# Patient Record
Sex: Male | Born: 1945
Health system: Southern US, Community
[De-identification: ages and names within clinical notes are randomized; demographics above are authoritative.]

## PROBLEM LIST (undated history)

## (undated) DIAGNOSIS — G4700 Insomnia, unspecified: Secondary | ICD-10-CM

## (undated) DIAGNOSIS — I2510 Atherosclerotic heart disease of native coronary artery without angina pectoris: Secondary | ICD-10-CM

## (undated) DIAGNOSIS — S83282A Other tear of lateral meniscus, current injury, left knee, initial encounter: Secondary | ICD-10-CM

## (undated) DIAGNOSIS — M545 Low back pain, unspecified: Secondary | ICD-10-CM

## (undated) DIAGNOSIS — I7122 Aneurysm of the aortic arch, without rupture: Secondary | ICD-10-CM

## (undated) DIAGNOSIS — I519 Heart disease, unspecified: Secondary | ICD-10-CM

## (undated) DIAGNOSIS — E785 Hyperlipidemia, unspecified: Secondary | ICD-10-CM

## (undated) DIAGNOSIS — N529 Male erectile dysfunction, unspecified: Secondary | ICD-10-CM

## (undated) DIAGNOSIS — R7301 Impaired fasting glucose: Secondary | ICD-10-CM

## (undated) DIAGNOSIS — R030 Elevated blood-pressure reading, without diagnosis of hypertension: Secondary | ICD-10-CM

## (undated) HISTORY — DX: Low back pain, unspecified: M54.50

## (undated) HISTORY — PX: CATARACT EXTRACTION: SUR2

## (undated) HISTORY — DX: Hyperlipidemia, unspecified: E78.5

## (undated) HISTORY — DX: Male erectile dysfunction, unspecified: N52.9

## (undated) HISTORY — PX: COLONOSCOPY: SHX174

## (undated) HISTORY — PX: CARDIAC CATHETERIZATION: SHX172

## (undated) HISTORY — DX: Low back pain: M54.5

## (undated) HISTORY — DX: Impaired fasting glucose: R73.01

## (undated) HISTORY — DX: Aneurysm of the aortic arch, without rupture: I71.22

## (undated) HISTORY — DX: Heart disease, unspecified: I51.9

## (undated) HISTORY — DX: Atherosclerotic heart disease of native coronary artery without angina pectoris: I25.10

## (undated) HISTORY — DX: Insomnia, unspecified: G47.00

## (undated) HISTORY — DX: Other tear of lateral meniscus, current injury, left knee, initial encounter: S83.282A

## (undated) HISTORY — DX: Elevated blood-pressure reading, without diagnosis of hypertension: R03.0

---

## 2012-05-24 DIAGNOSIS — Z1331 Encounter for screening for depression: Secondary | ICD-10-CM | POA: Diagnosis not present

## 2012-05-24 DIAGNOSIS — M545 Low back pain, unspecified: Secondary | ICD-10-CM | POA: Diagnosis not present

## 2012-05-24 DIAGNOSIS — E7800 Pure hypercholesterolemia, unspecified: Secondary | ICD-10-CM | POA: Diagnosis not present

## 2012-05-24 DIAGNOSIS — Z86010 Personal history of colonic polyps: Secondary | ICD-10-CM | POA: Diagnosis not present

## 2012-05-24 DIAGNOSIS — Z79899 Other long term (current) drug therapy: Secondary | ICD-10-CM | POA: Diagnosis not present

## 2012-05-24 DIAGNOSIS — G4700 Insomnia, unspecified: Secondary | ICD-10-CM | POA: Diagnosis not present

## 2012-05-24 DIAGNOSIS — N529 Male erectile dysfunction, unspecified: Secondary | ICD-10-CM | POA: Diagnosis not present

## 2012-05-24 DIAGNOSIS — Z0000 Encounter for general adult medical examination without abnormal findings: Secondary | ICD-10-CM | POA: Diagnosis not present

## 2012-10-03 DIAGNOSIS — Z961 Presence of intraocular lens: Secondary | ICD-10-CM | POA: Diagnosis not present

## 2012-10-17 DIAGNOSIS — Z09 Encounter for follow-up examination after completed treatment for conditions other than malignant neoplasm: Secondary | ICD-10-CM | POA: Diagnosis not present

## 2012-10-17 DIAGNOSIS — Z86010 Personal history of colonic polyps: Secondary | ICD-10-CM | POA: Diagnosis not present

## 2013-05-06 DIAGNOSIS — N390 Urinary tract infection, site not specified: Secondary | ICD-10-CM | POA: Diagnosis not present

## 2013-06-12 DIAGNOSIS — N529 Male erectile dysfunction, unspecified: Secondary | ICD-10-CM | POA: Diagnosis not present

## 2013-06-12 DIAGNOSIS — E7800 Pure hypercholesterolemia, unspecified: Secondary | ICD-10-CM | POA: Diagnosis not present

## 2013-06-12 DIAGNOSIS — Z86010 Personal history of colonic polyps: Secondary | ICD-10-CM | POA: Diagnosis not present

## 2013-06-12 DIAGNOSIS — Z79899 Other long term (current) drug therapy: Secondary | ICD-10-CM | POA: Diagnosis not present

## 2013-06-12 DIAGNOSIS — N390 Urinary tract infection, site not specified: Secondary | ICD-10-CM | POA: Diagnosis not present

## 2013-06-12 DIAGNOSIS — Z125 Encounter for screening for malignant neoplasm of prostate: Secondary | ICD-10-CM | POA: Diagnosis not present

## 2013-06-12 DIAGNOSIS — Z1331 Encounter for screening for depression: Secondary | ICD-10-CM | POA: Diagnosis not present

## 2013-06-12 DIAGNOSIS — M545 Low back pain, unspecified: Secondary | ICD-10-CM | POA: Diagnosis not present

## 2013-06-12 DIAGNOSIS — Z0000 Encounter for general adult medical examination without abnormal findings: Secondary | ICD-10-CM | POA: Diagnosis not present

## 2013-07-24 DIAGNOSIS — Z961 Presence of intraocular lens: Secondary | ICD-10-CM | POA: Diagnosis not present

## 2013-07-24 DIAGNOSIS — H524 Presbyopia: Secondary | ICD-10-CM | POA: Diagnosis not present

## 2013-07-24 DIAGNOSIS — H52229 Regular astigmatism, unspecified eye: Secondary | ICD-10-CM | POA: Diagnosis not present

## 2013-09-12 DIAGNOSIS — F528 Other sexual dysfunction not due to a substance or known physiological condition: Secondary | ICD-10-CM | POA: Diagnosis not present

## 2013-09-12 DIAGNOSIS — Z1331 Encounter for screening for depression: Secondary | ICD-10-CM | POA: Diagnosis not present

## 2013-09-12 DIAGNOSIS — Z6825 Body mass index (BMI) 25.0-25.9, adult: Secondary | ICD-10-CM | POA: Diagnosis not present

## 2013-09-12 DIAGNOSIS — IMO0002 Reserved for concepts with insufficient information to code with codable children: Secondary | ICD-10-CM | POA: Diagnosis not present

## 2013-09-12 DIAGNOSIS — E785 Hyperlipidemia, unspecified: Secondary | ICD-10-CM | POA: Diagnosis not present

## 2013-09-12 DIAGNOSIS — Z23 Encounter for immunization: Secondary | ICD-10-CM | POA: Diagnosis not present

## 2014-05-14 DIAGNOSIS — E785 Hyperlipidemia, unspecified: Secondary | ICD-10-CM | POA: Diagnosis not present

## 2014-05-14 DIAGNOSIS — Z125 Encounter for screening for malignant neoplasm of prostate: Secondary | ICD-10-CM | POA: Diagnosis not present

## 2014-05-14 DIAGNOSIS — Z79899 Other long term (current) drug therapy: Secondary | ICD-10-CM | POA: Diagnosis not present

## 2014-05-21 DIAGNOSIS — Z23 Encounter for immunization: Secondary | ICD-10-CM | POA: Diagnosis not present

## 2014-05-21 DIAGNOSIS — Z1212 Encounter for screening for malignant neoplasm of rectum: Secondary | ICD-10-CM | POA: Diagnosis not present

## 2014-05-21 DIAGNOSIS — E785 Hyperlipidemia, unspecified: Secondary | ICD-10-CM | POA: Diagnosis not present

## 2014-05-21 DIAGNOSIS — F528 Other sexual dysfunction not due to a substance or known physiological condition: Secondary | ICD-10-CM | POA: Diagnosis not present

## 2014-05-21 DIAGNOSIS — R030 Elevated blood-pressure reading, without diagnosis of hypertension: Secondary | ICD-10-CM | POA: Diagnosis not present

## 2014-05-21 DIAGNOSIS — Z1331 Encounter for screening for depression: Secondary | ICD-10-CM | POA: Diagnosis not present

## 2014-05-21 DIAGNOSIS — Z0000 Encounter for general adult medical examination without abnormal findings: Secondary | ICD-10-CM | POA: Diagnosis not present

## 2014-05-21 DIAGNOSIS — M79609 Pain in unspecified limb: Secondary | ICD-10-CM | POA: Diagnosis not present

## 2014-05-26 ENCOUNTER — Other Ambulatory Visit: Payer: Self-pay | Admitting: Internal Medicine

## 2014-05-26 DIAGNOSIS — Z0000 Encounter for general adult medical examination without abnormal findings: Secondary | ICD-10-CM

## 2014-05-30 ENCOUNTER — Ambulatory Visit: Payer: Self-pay

## 2014-06-16 ENCOUNTER — Ambulatory Visit
Admission: RE | Admit: 2014-06-16 | Discharge: 2014-06-16 | Disposition: A | Discharge status: Home / Self Care | Payer: Medicare Other | Source: Ambulatory Visit | Attending: Internal Medicine | Admitting: Internal Medicine

## 2014-06-16 DIAGNOSIS — Z136 Encounter for screening for cardiovascular disorders: Secondary | ICD-10-CM | POA: Diagnosis not present

## 2014-06-16 DIAGNOSIS — Z0000 Encounter for general adult medical examination without abnormal findings: Secondary | ICD-10-CM

## 2014-09-01 DIAGNOSIS — H524 Presbyopia: Secondary | ICD-10-CM | POA: Diagnosis not present

## 2014-09-01 DIAGNOSIS — Z961 Presence of intraocular lens: Secondary | ICD-10-CM | POA: Diagnosis not present

## 2014-09-02 DIAGNOSIS — Z23 Encounter for immunization: Secondary | ICD-10-CM | POA: Diagnosis not present

## 2015-06-03 DIAGNOSIS — R030 Elevated blood-pressure reading, without diagnosis of hypertension: Secondary | ICD-10-CM | POA: Diagnosis not present

## 2015-06-03 DIAGNOSIS — Z125 Encounter for screening for malignant neoplasm of prostate: Secondary | ICD-10-CM | POA: Diagnosis not present

## 2015-06-03 DIAGNOSIS — E785 Hyperlipidemia, unspecified: Secondary | ICD-10-CM | POA: Diagnosis not present

## 2015-06-03 DIAGNOSIS — E784 Other hyperlipidemia: Secondary | ICD-10-CM | POA: Diagnosis not present

## 2015-06-03 DIAGNOSIS — Z0000 Encounter for general adult medical examination without abnormal findings: Secondary | ICD-10-CM | POA: Diagnosis not present

## 2015-06-03 DIAGNOSIS — E781 Pure hyperglyceridemia: Secondary | ICD-10-CM | POA: Diagnosis not present

## 2015-06-08 DIAGNOSIS — Z1212 Encounter for screening for malignant neoplasm of rectum: Secondary | ICD-10-CM | POA: Diagnosis not present

## 2015-06-11 DIAGNOSIS — R030 Elevated blood-pressure reading, without diagnosis of hypertension: Secondary | ICD-10-CM | POA: Diagnosis not present

## 2015-06-11 DIAGNOSIS — F5221 Male erectile disorder: Secondary | ICD-10-CM | POA: Diagnosis not present

## 2015-06-11 DIAGNOSIS — M545 Low back pain: Secondary | ICD-10-CM | POA: Diagnosis not present

## 2015-06-11 DIAGNOSIS — Z0000 Encounter for general adult medical examination without abnormal findings: Secondary | ICD-10-CM | POA: Diagnosis not present

## 2015-06-11 DIAGNOSIS — E784 Other hyperlipidemia: Secondary | ICD-10-CM | POA: Diagnosis not present

## 2015-06-11 DIAGNOSIS — Z1389 Encounter for screening for other disorder: Secondary | ICD-10-CM | POA: Diagnosis not present

## 2015-06-11 DIAGNOSIS — Z6824 Body mass index (BMI) 24.0-24.9, adult: Secondary | ICD-10-CM | POA: Diagnosis not present

## 2015-07-25 DIAGNOSIS — Z23 Encounter for immunization: Secondary | ICD-10-CM | POA: Diagnosis not present

## 2016-02-24 DIAGNOSIS — G930 Cerebral cysts: Secondary | ICD-10-CM | POA: Diagnosis not present

## 2016-02-24 DIAGNOSIS — Z6824 Body mass index (BMI) 24.0-24.9, adult: Secondary | ICD-10-CM | POA: Diagnosis not present

## 2016-02-24 DIAGNOSIS — E784 Other hyperlipidemia: Secondary | ICD-10-CM | POA: Diagnosis not present

## 2016-02-24 DIAGNOSIS — R030 Elevated blood-pressure reading, without diagnosis of hypertension: Secondary | ICD-10-CM | POA: Diagnosis not present

## 2016-02-24 DIAGNOSIS — R209 Unspecified disturbances of skin sensation: Secondary | ICD-10-CM | POA: Diagnosis not present

## 2016-06-13 DIAGNOSIS — E784 Other hyperlipidemia: Secondary | ICD-10-CM | POA: Diagnosis not present

## 2016-06-13 DIAGNOSIS — Z125 Encounter for screening for malignant neoplasm of prostate: Secondary | ICD-10-CM | POA: Diagnosis not present

## 2016-06-13 DIAGNOSIS — F5221 Male erectile disorder: Secondary | ICD-10-CM | POA: Diagnosis not present

## 2016-06-17 DIAGNOSIS — R030 Elevated blood-pressure reading, without diagnosis of hypertension: Secondary | ICD-10-CM | POA: Diagnosis not present

## 2016-06-17 DIAGNOSIS — Z0000 Encounter for general adult medical examination without abnormal findings: Secondary | ICD-10-CM | POA: Diagnosis not present

## 2016-06-17 DIAGNOSIS — Z6824 Body mass index (BMI) 24.0-24.9, adult: Secondary | ICD-10-CM | POA: Diagnosis not present

## 2016-06-17 DIAGNOSIS — Z23 Encounter for immunization: Secondary | ICD-10-CM | POA: Diagnosis not present

## 2016-06-17 DIAGNOSIS — Z1389 Encounter for screening for other disorder: Secondary | ICD-10-CM | POA: Diagnosis not present

## 2016-06-17 DIAGNOSIS — E784 Other hyperlipidemia: Secondary | ICD-10-CM | POA: Diagnosis not present

## 2016-06-17 DIAGNOSIS — F5221 Male erectile disorder: Secondary | ICD-10-CM | POA: Diagnosis not present

## 2016-06-17 DIAGNOSIS — R209 Unspecified disturbances of skin sensation: Secondary | ICD-10-CM | POA: Diagnosis not present

## 2016-06-17 DIAGNOSIS — Z1212 Encounter for screening for malignant neoplasm of rectum: Secondary | ICD-10-CM | POA: Diagnosis not present

## 2016-06-17 DIAGNOSIS — M545 Low back pain: Secondary | ICD-10-CM | POA: Diagnosis not present

## 2016-06-20 DIAGNOSIS — Z1212 Encounter for screening for malignant neoplasm of rectum: Secondary | ICD-10-CM | POA: Diagnosis not present

## 2016-08-05 IMAGING — US US AORTA SCREENING (MEDICARE)
1 series · 14 of 19 positions shown · non-contrast
Comparison: None.

CLINICAL DATA: Medicare screening exam for abdominal aortic
aneurysm.

EXAM:
ABDOMINAL AORTA SCREENING ULTRASOUND
TECHNIQUE: Ultrasound examination of the abdominal aorta was performed as a
screening evaluation for abdominal aortic aneurysm.

[Series 1: us aorta screening (medicare) · 0.18mm/px · 14 of 19 slices shown]
[im 1/19]
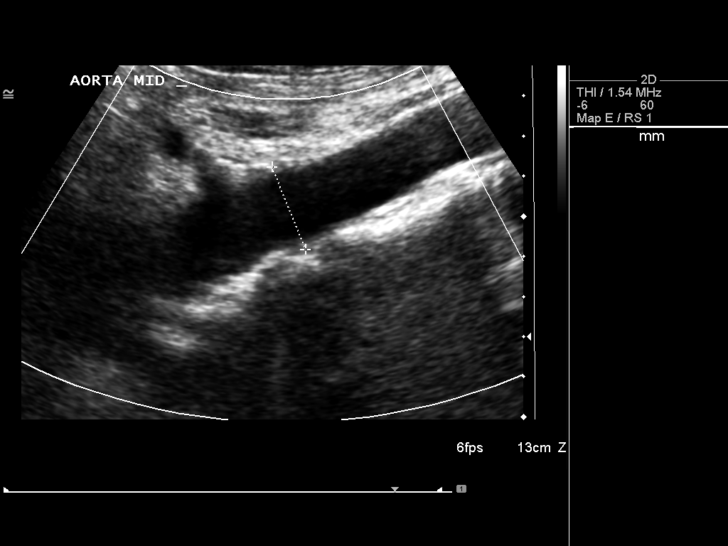
[im 3/19]
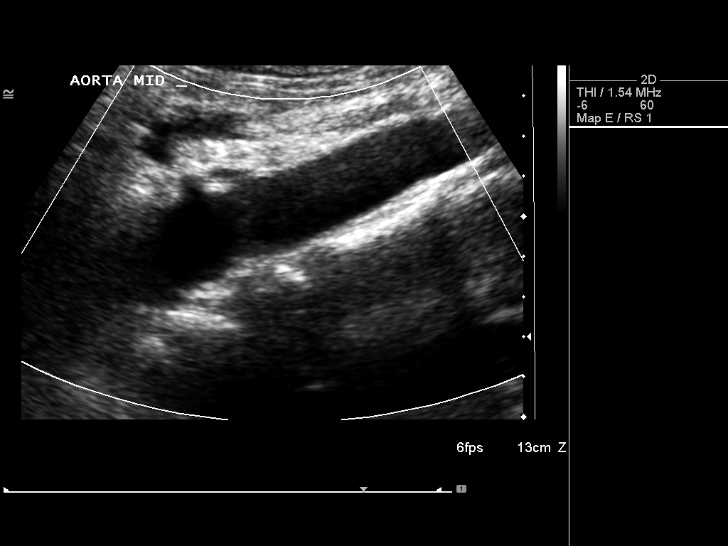
[im 4/19]
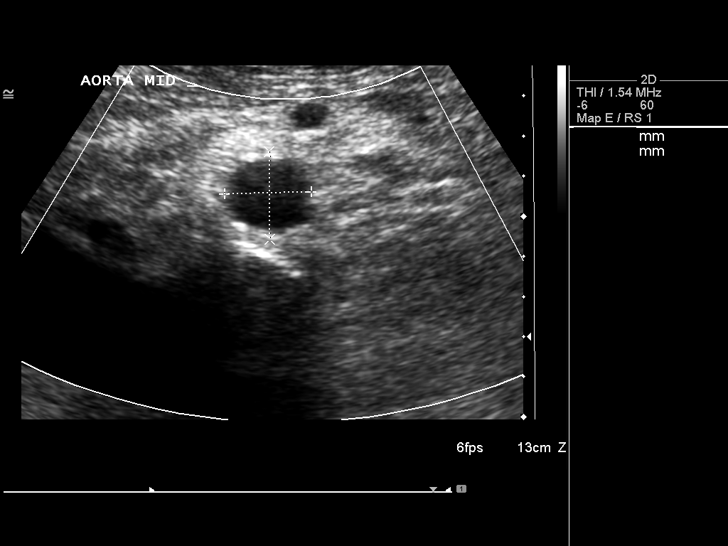
[im 5/19]
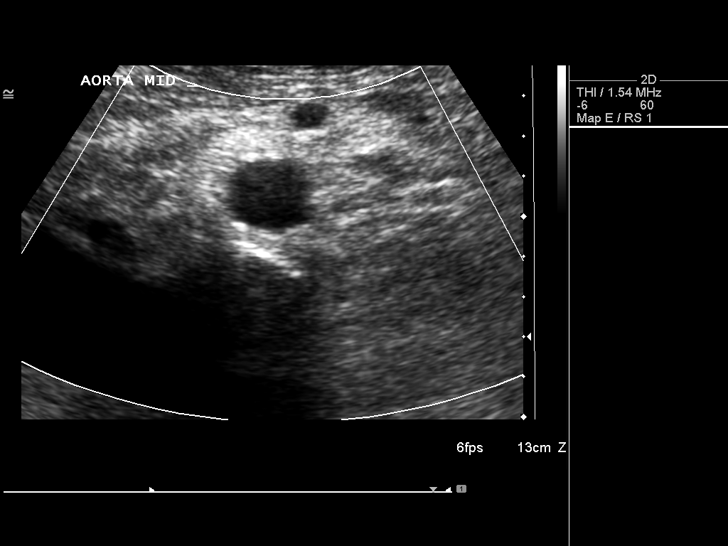
[im 7/19]
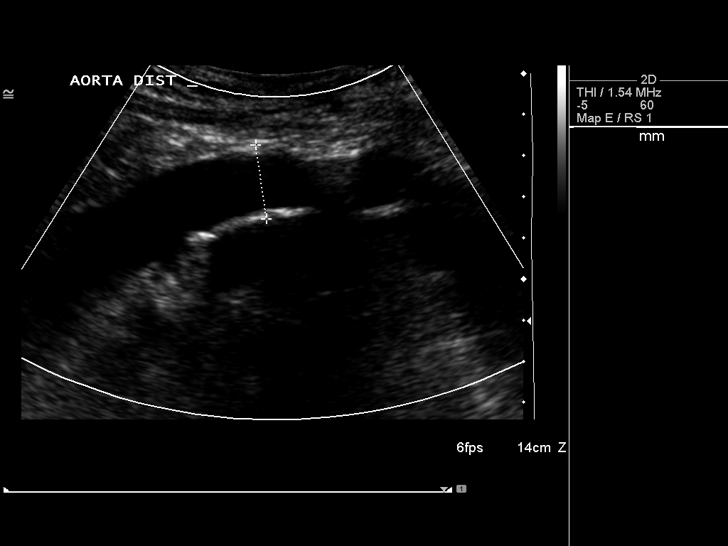
[im 8/19]
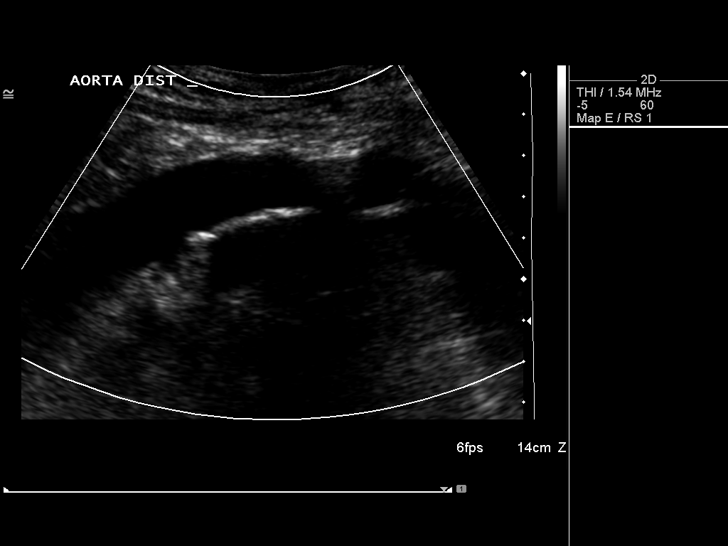
[im 9/19]
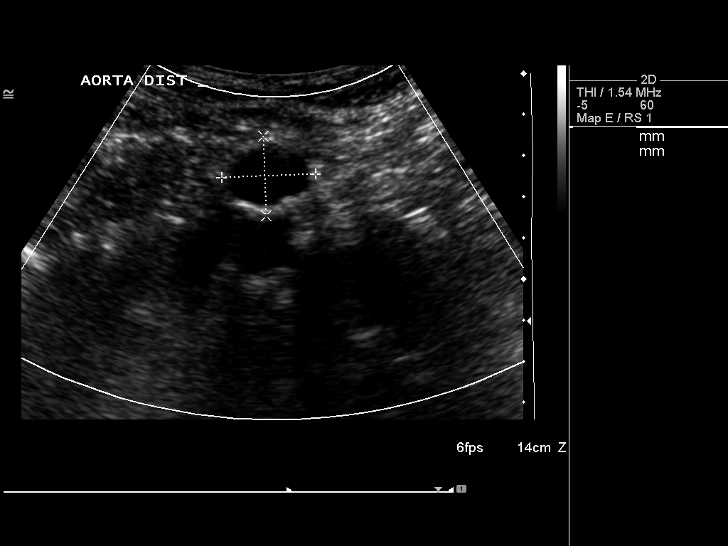
[im 11/19]
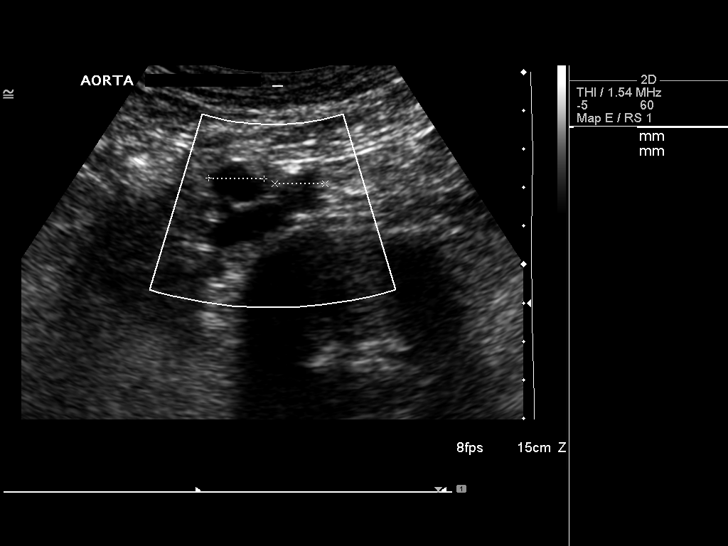
[im 12/19]
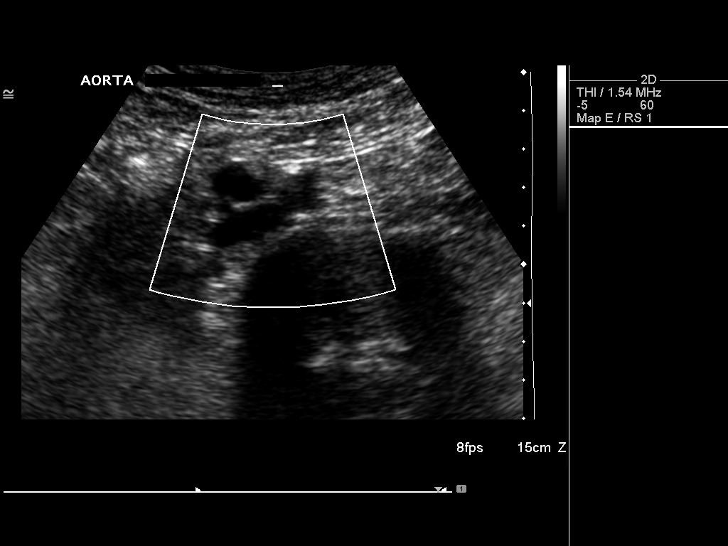
[im 13/19]
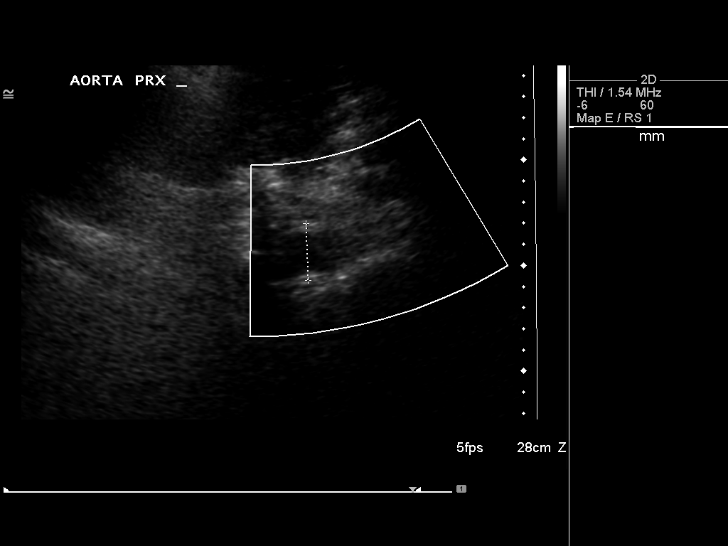
[im 15/19]
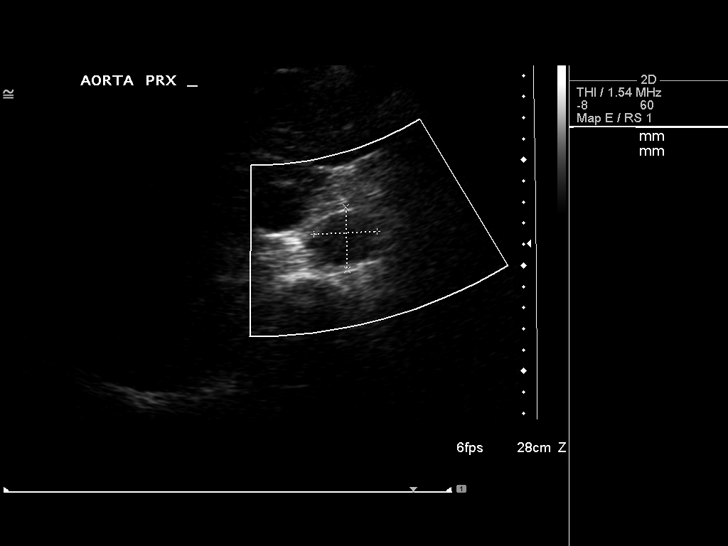
[im 16/19]
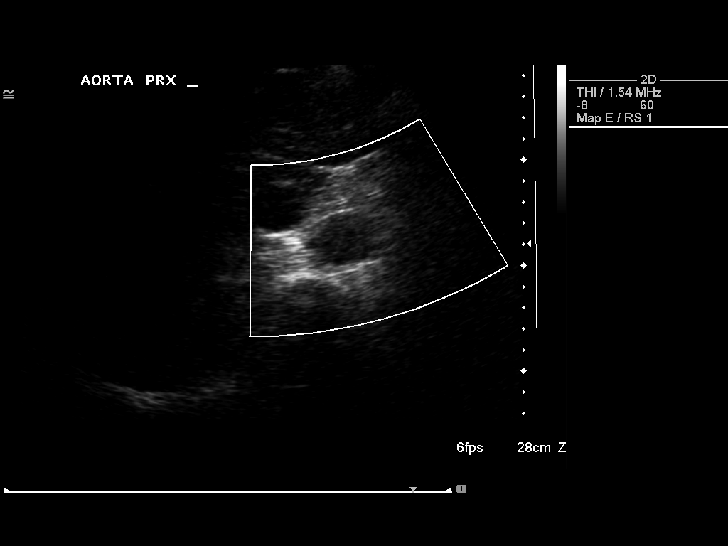
[im 17/19]
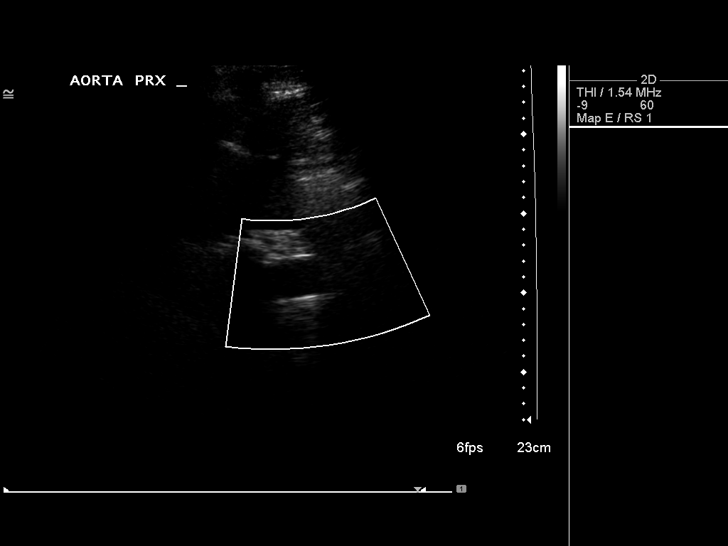
[im 19/19]
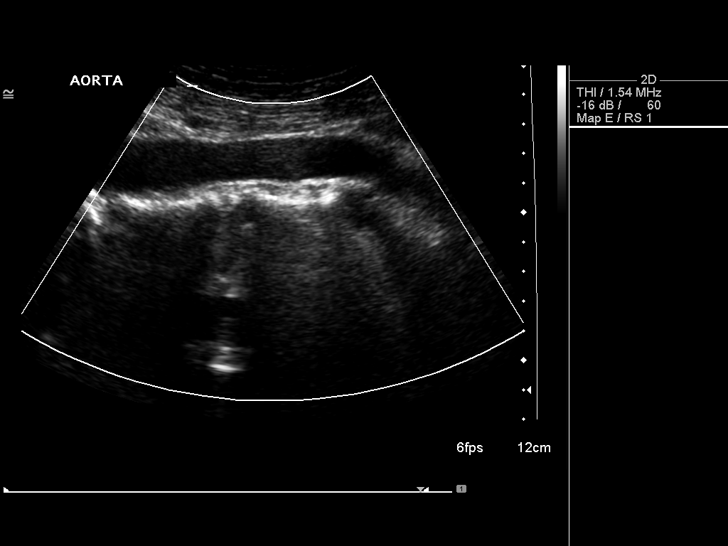

[14 of 19 positions shown; findings below may reference images not displayed]

FINDINGS: Abdominal Aorta

The abdominal aorta exhibits very mild failure to taper in the
infrarenal portion. There small amount of soft plaque present.

Maximum AP

Diameter:  The maximal AP dimension is 3 cm.

Maximum TRV

Diameter: The maximal transverse dimension is 3 cm.

The mid abdominal aorta measures 2.2 cm in diameter. The infrarenal
aorta measures 1.9 cm AP x 2.3 cm stroke transversely.
IMPRESSION: There is mild failure to taper of the infrarenal abdominal aorta,
but there is no evidence of an aneurysm.

## 2017-05-17 DIAGNOSIS — H52203 Unspecified astigmatism, bilateral: Secondary | ICD-10-CM | POA: Diagnosis not present

## 2017-05-17 DIAGNOSIS — H524 Presbyopia: Secondary | ICD-10-CM | POA: Diagnosis not present

## 2017-05-17 DIAGNOSIS — H5213 Myopia, bilateral: Secondary | ICD-10-CM | POA: Diagnosis not present

## 2017-05-17 DIAGNOSIS — Z961 Presence of intraocular lens: Secondary | ICD-10-CM | POA: Diagnosis not present

## 2017-06-21 DIAGNOSIS — Z0000 Encounter for general adult medical examination without abnormal findings: Secondary | ICD-10-CM | POA: Diagnosis not present

## 2017-06-21 DIAGNOSIS — E784 Other hyperlipidemia: Secondary | ICD-10-CM | POA: Diagnosis not present

## 2017-06-21 DIAGNOSIS — Z125 Encounter for screening for malignant neoplasm of prostate: Secondary | ICD-10-CM | POA: Diagnosis not present

## 2017-06-21 DIAGNOSIS — E785 Hyperlipidemia, unspecified: Secondary | ICD-10-CM | POA: Diagnosis not present

## 2017-06-26 DIAGNOSIS — Z1212 Encounter for screening for malignant neoplasm of rectum: Secondary | ICD-10-CM | POA: Diagnosis not present

## 2017-06-27 ENCOUNTER — Other Ambulatory Visit: Payer: Self-pay | Admitting: Internal Medicine

## 2017-06-27 DIAGNOSIS — R030 Elevated blood-pressure reading, without diagnosis of hypertension: Secondary | ICD-10-CM | POA: Diagnosis not present

## 2017-06-27 DIAGNOSIS — R7301 Impaired fasting glucose: Secondary | ICD-10-CM | POA: Diagnosis not present

## 2017-06-27 DIAGNOSIS — G4709 Other insomnia: Secondary | ICD-10-CM | POA: Diagnosis not present

## 2017-06-27 DIAGNOSIS — Z23 Encounter for immunization: Secondary | ICD-10-CM | POA: Diagnosis not present

## 2017-06-27 DIAGNOSIS — E784 Other hyperlipidemia: Secondary | ICD-10-CM | POA: Diagnosis not present

## 2017-06-27 DIAGNOSIS — Z1389 Encounter for screening for other disorder: Secondary | ICD-10-CM | POA: Diagnosis not present

## 2017-06-27 DIAGNOSIS — Z0000 Encounter for general adult medical examination without abnormal findings: Secondary | ICD-10-CM | POA: Diagnosis not present

## 2017-06-27 DIAGNOSIS — E785 Hyperlipidemia, unspecified: Secondary | ICD-10-CM

## 2017-06-27 DIAGNOSIS — M545 Low back pain: Secondary | ICD-10-CM | POA: Diagnosis not present

## 2017-06-27 DIAGNOSIS — F5221 Male erectile disorder: Secondary | ICD-10-CM | POA: Diagnosis not present

## 2017-06-27 DIAGNOSIS — Z6824 Body mass index (BMI) 24.0-24.9, adult: Secondary | ICD-10-CM | POA: Diagnosis not present

## 2017-06-27 DIAGNOSIS — Z125 Encounter for screening for malignant neoplasm of prostate: Secondary | ICD-10-CM | POA: Diagnosis not present

## 2017-06-29 ENCOUNTER — Ambulatory Visit
Admission: RE | Admit: 2017-06-29 | Discharge: 2017-06-29 | Disposition: A | Discharge status: Home / Self Care | Payer: No Typology Code available for payment source | Source: Ambulatory Visit | Attending: Internal Medicine | Admitting: Internal Medicine

## 2017-06-29 DIAGNOSIS — E785 Hyperlipidemia, unspecified: Secondary | ICD-10-CM

## 2017-07-29 DIAGNOSIS — Z23 Encounter for immunization: Secondary | ICD-10-CM | POA: Diagnosis not present

## 2017-08-04 ENCOUNTER — Encounter (INDEPENDENT_AMBULATORY_CARE_PROVIDER_SITE_OTHER): Payer: Self-pay

## 2017-08-04 ENCOUNTER — Ambulatory Visit (INDEPENDENT_AMBULATORY_CARE_PROVIDER_SITE_OTHER): Payer: Medicare Other | Admitting: Internal Medicine

## 2017-08-04 ENCOUNTER — Encounter: Payer: Self-pay | Admitting: *Deleted

## 2017-08-04 ENCOUNTER — Encounter: Payer: Self-pay | Admitting: Internal Medicine

## 2017-08-04 VITALS — BP 124/80 | HR 65 | Ht 70.0 in | Wt 169.8 lb

## 2017-08-04 DIAGNOSIS — I25119 Atherosclerotic heart disease of native coronary artery with unspecified angina pectoris: Secondary | ICD-10-CM | POA: Insufficient documentation

## 2017-08-04 DIAGNOSIS — E785 Hyperlipidemia, unspecified: Secondary | ICD-10-CM

## 2017-08-04 DIAGNOSIS — I2584 Coronary atherosclerosis due to calcified coronary lesion: Secondary | ICD-10-CM

## 2017-08-04 DIAGNOSIS — I200 Unstable angina: Secondary | ICD-10-CM

## 2017-08-04 DIAGNOSIS — I2510 Atherosclerotic heart disease of native coronary artery without angina pectoris: Secondary | ICD-10-CM | POA: Diagnosis not present

## 2017-08-04 DIAGNOSIS — E1169 Type 2 diabetes mellitus with other specified complication: Secondary | ICD-10-CM | POA: Insufficient documentation

## 2017-08-04 DIAGNOSIS — I25118 Atherosclerotic heart disease of native coronary artery with other forms of angina pectoris: Secondary | ICD-10-CM | POA: Insufficient documentation

## 2017-08-04 MED ORDER — NITROGLYCERIN 0.4 MG SL SUBL: 0.4000 mg | SUBLINGUAL_TABLET | SUBLINGUAL | 3 refills | Status: DC | PRN

## 2017-08-04 NOTE — Progress Notes (Signed)
New Outpatient Visit Date: 08/04/2017  Referring Provider: Marton Redwood, MD [address] [place], [place] [address]  Chief Complaint: Fatigue and abnormal coronary calcium score  HPI:  John Mcmahon is a 71 y.o. male who is being seen today for the evaluation of fatigue, shortness of breath, and abnormal coronary calcium score at the request of Dr. Brigitte Pulse. He has a history of hyperlipidemia and impaired fasting glucose. Over the last few months, John Mcmahon has noted increasing fatigue and shortness of breath with modest activity. Twice this summer he had stopped cleaning out his garage because he felt as though he had "hit a wall." He has also noticed a dull, difficult to describe discomfort in the center of his chest when walking. He typically walks through the pain. The pain gradually disappears 15 to 60 minutes after he stops walking. He is also noted some occasional lightheadedness when standing or exerting himself. As part of a recent physical with Dr. Brigitte Pulse, [name] underwent coronary calcium scoring, which was quite high at 952.  John Mcmahon has tried multiple cholesterol medications in the past, including simvastatin rosuvastatin, and ezetimibe. He has noted lightheadedness and insomnia with these agents, forcing him to stop. He most recently tried rosuvastatin earlier this month and had to discontinue it after about a week.  John Mcmahon underwent a stress test as part of a work physical about 25 years ago and states that it was normal. He has otherwise not undergone any further cardiac testing other than the aforementioned coronary calcium score. He has been checking his blood pressure at home and notes that it is typically normal. Sometimes, however, his resting heart rate seems elevated near 100 bpm. He denies frank palpitations, as well as orthopnea, PND, claudication, and edema.  --------------------------------------------------------------------------------------------------  Cardiovascular  History & Procedures: Cardiovascular Problems:  Accelerating angina with high risk coronary calcium score  Risk Factors:  Coronary artery calcification, hyperlipidemia, male gender, age greater than 53  Cath/PCI:  None  CV Surgery:  None  EP Procedures and Devices:  None  Non-Invasive Evaluation(s):  Coronary calcium score (06/29/17): Calcium score of 952 with extensive calcifications in the LAD him a as well as less prominent calcification involving the LMCA, LCx, and RCA. Slight aneurysmal dilation of ascending aortic, measuring 4.0 cm. 18 mm left lower lobe nodule.Marland Kitchen  --------------------------------------------------------------------------------------------------  Past Medical History:  Diagnosis Date  . Acute meniscal tear, lateral, left, initial encounter   . ED (erectile dysfunction)   . Elevated blood pressure reading in office with white coat syndrome, without diagnosis of hypertension   . Hyperlipidemia   . IFG (impaired fasting glucose)   . Insomnia   . Lumbago     Past Surgical History:  Procedure Laterality Date  . CATARACT EXTRACTION      Current Meds  Medication Sig  . aspirin EC 81 MG tablet Take 81 mg by mouth daily.  . Ibuprofen-Diphenhydramine HCl (ADVIL PM) 200-25 MG CAPS Take 1 tablet by mouth at bedtime.  . Melatonin 5 MG CAPS Take 1 capsule by mouth at bedtime.  . [DISCONTINUED] Melatonin-Pyridoxine (MELATIN PO) Take 1 tablet by mouth at bedtime.    Allergies: Other  Social History   Social History  . Marital status: Unknown    Spouse name: N/A  . Number of children: N/A  . Years of education: N/A   Occupational History  . Not on file.   Social History Main Topics  . Smoking status: Former Smoker    Packs/day: 1.00  Years: 2.00    Types: Cigarettes    Quit date: 08/03/1969  . Smokeless tobacco: Never Used  . Alcohol use 1.8 oz/week    3 Glasses of wine per week  . Drug use: No  . Sexual activity: Not on file   Other  Topics Concern  . Not on file   Social History Narrative  . No narrative on file    Family History  Problem Relation Age of Onset  . Multiple myeloma Mother   . Cancer - Other Mother 40       BREAST  . Dementia Father   . Cancer - Other Maternal Grandmother   . Diabetes Maternal Grandmother   . Heart attack Maternal Grandfather 65  . Dementia Paternal Grandfather   . Healthy Son   . Healthy Son   . Healthy Daughter   . Heart attack Cousin     Review of Systems: Review of Systems  Constitutional: Negative.   HENT: Negative.   Eyes: Negative.   Respiratory: Positive for shortness of breath (with activity).   Cardiovascular: Positive for chest pain.  Gastrointestinal: Negative.   Genitourinary: Negative.   Musculoskeletal: Positive for back pain.  Skin: Negative.   Neurological: Positive for dizziness.  Endo/Heme/Allergies: Negative.   Psychiatric/Behavioral: Negative.    --------------------------------------------------------------------------------------------------  Physical Exam: BP 124/80   Pulse 65   Ht 5' 10" (1.778 m)   Wt 169 lb 12.8 oz (77 kg)   SpO2 95%   BMI 24.36 kg/m   General:  Well-developed, well-nourished man, seated comfortably in the exam room. HEENT: No conjunctival pallor or scleral icterus. Moist mucous membranes. OP clear. Neck: Supple without lymphadenopathy, thyromegaly, JVD, or HJR. No carotid bruit. Lungs: Normal work of breathing. Clear to auscultation bilaterally without wheezes or crackles. Heart: Regular rate and rhythm without murmurs, rubs, or gallops. Non-displaced PMI. Abd: Bowel sounds present. Soft, NT/ND without hepatosplenomegaly Ext: No lower extremity edema. Radial, PT, and DP pulses are 2+ bilaterally Skin: Warm and dry without rash. Neuro: CNIII-XII intact. Strength and fine-touch sensation intact in upper and lower extremities bilaterally. Psych: Normal mood and affect.  EKG:  Normal sinus rhythm without  abnormalities.  Outside labs (06/21/17): Sodium 139, potassium 4.8, chloride 107, CO2 23, BUN 20, creatinine 0.8, glucose 113, calcium 9.1, AST 21, ALT 24, alkaline phosphatase 85, total bilirubin 1.6, total protein 6.6, albumin 3.6  WBC 5.2, HGB 16.1, HCT 45.9, platelets 171  Total cholesterol 195, triglycerides 92, HDL 35, LDL 142  TSH 1.96  --------------------------------------------------------------------------------------------------  ASSESSMENT AND PLAN: Coronary artery calcification with accelerating angina Mr. Sylvestre reports worsening fatigue, dyspnea on exertion, and exertional chest discomfort over the last 6 months. Coupled with his cardiac factors and high coronary calcium score, I am concerned that this represents accelerating. We have discussed further workup options, including stress testing and cardiac catheterization. We have agreed to proceed with cardiac catheterization, given his high pretest probability. I have arranged for catheterization on Monday (08/07/17). I have reviewed the risks, indications, and alternatives to cardiac catheterization, possible angioplasty, and stenting with the patient. Risks include but are not limited to bleeding, infection, vascular injury, stroke, myocardial infection, arrhythmia, kidney injury, radiation-related injury in the case of prolonged fluoroscopy use, emergency cardiac surgery, and death. The patient understands the risks of serious complication is 1-2 in 1000 with diagnostic cardiac cath and 1-2% or less with angioplasty/stenting. I have asked Mr. Cudney to continue with low-dose aspirin. I have also given him a prescription for sublingual nitroglycerin   to be taken as needed.  Hyperlipidemia LDL moderately elevated on most recent check by Dr. Brigitte Pulse. Given high degree of coronary artery calcification and angina, Mr. Leider certainly would benefit from lipid therapy. Unfortunately, he has been intolerant of multiple agents in the past. We  spoke about additional treatment options, including PCSK9 inhibitors. We will refer him to the lipid clinic after his cardiac catheterization for further discussion of treatment options.  Follow-up: To be determined based on results of catheterization.  Nelva Bush, MD 08/04/2017 1:03 PM

## 2017-08-04 NOTE — Patient Instructions (Signed)
Medication Instructions:  Use nitroglycerin as needed for chest pain.  Labwork: BMET/CBCd/PT/INR today  Testing/Procedures: Your physician has requested that you have a cardiac catheterization. Cardiac catheterization is used to diagnose and/or treat various heart conditions. Doctors may recommend this procedure for a number of different reasons. The most common reason is to evaluate chest pain. Chest pain can be a symptom of coronary artery disease (CAD), and cardiac catheterization can show whether plaque is narrowing or blocking your heart's arteries. This procedure is also used to evaluate the valves, as well as measure the blood flow and oxygen levels in different parts of your heart. For further information please visit [email or web address] Please follow instruction sheet, as given.  Monday October 8,2018  Follow-Up: We will let you know about follow-up after the procedure on Monday.     Nitroglycerin sublingual tablets What is this medicine? NITROGLYCERIN (nye troe GLI ser in) is a type of vasodilator. It relaxes blood vessels, increasing the blood and oxygen supply to your heart. This medicine is used to relieve chest pain caused by angina. It is also used to prevent chest pain before activities like climbing stairs, going outdoors in cold weather, or sexual activity. This medicine may be used for other purposes; ask your health care provider or pharmacist if you have questions. COMMON BRAND NAME(S): Nitroquick, Nitrostat, Nitrotab What should I tell my health care provider before I take this medicine? They need to know if you have any of these conditions: -anemia -head injury, recent stroke, or bleeding in the brain -liver disease -previous heart attack -an unusual or allergic reaction to nitroglycerin, other medicines, foods, dyes, or preservatives -pregnant or trying to get pregnant -breast-feeding How should I use this medicine? Take this medicine by mouth as needed. At  the first sign of an angina attack (chest pain or tightness) place one tablet under your tongue. You can also take this medicine 5 to 10 minutes before an event likely to produce chest pain. Follow the directions on the prescription label. Let the tablet dissolve under the tongue. Do not swallow whole. Replace the dose if you accidentally swallow it. It will help if your mouth is not dry. Saliva around the tablet will help it to dissolve more quickly. Do not eat or drink, smoke or chew tobacco while a tablet is dissolving. If you are not better within 5 minutes after taking ONE dose of nitroglycerin, call 9-1-1 immediately to seek emergency medical care. Do not take more than 3 nitroglycerin tablets over 15 minutes. If you take this medicine often to relieve symptoms of angina, your doctor or health care professional may provide you with different instructions to manage your symptoms. If symptoms do not go away after following these instructions, it is important to call 9-1-1 immediately. Do not take more than 3 nitroglycerin tablets over 15 minutes. Talk to your pediatrician regarding the use of this medicine in children. Special care may be needed. Overdosage: If you think you have taken too much of this medicine contact a poison control center or emergency room at once. NOTE: This medicine is only for you. Do not share this medicine with others. What if I miss a dose? This does not apply. This medicine is only used as needed. What may interact with this medicine? Do not take this medicine with any of the following medications: -certain migraine medicines like ergotamine and dihydroergotamine (DHE) -medicines used to treat erectile dysfunction like sildenafil, tadalafil, and vardenafil -riociguat This medicine may also interact with the  following medications: -alteplase -aspirin -heparin -medicines for high blood pressure -medicines for mental depression -other medicines used to treat  angina -phenothiazines like chlorpromazine, mesoridazine, prochlorperazine, thioridazine This list may not describe all possible interactions. Give your health care provider a list of all the medicines, herbs, non-prescription drugs, or dietary supplements you use. Also tell them if you smoke, drink alcohol, or use illegal drugs. Some items may interact with your medicine. What should I watch for while using this medicine? Tell your doctor or health care professional if you feel your medicine is no longer working. Keep this medicine with you at all times. Sit or lie down when you take your medicine to prevent falling if you feel dizzy or faint after using it. Try to remain calm. This will help you to feel better faster. If you feel dizzy, take several deep breaths and lie down with your feet propped up, or bend forward with your head resting between your knees. You may get drowsy or dizzy. Do not drive, use machinery, or do anything that needs mental alertness until you know how this drug affects you. Do not stand or sit up quickly, especially if you are an older patient. This reduces the risk of dizzy or fainting spells. Alcohol can make you more drowsy and dizzy. Avoid alcoholic drinks. Do not treat yourself for coughs, colds, or pain while you are taking this medicine without asking your doctor or health care professional for advice. Some ingredients may increase your blood pressure. What side effects may I notice from receiving this medicine? Side effects that you should report to your doctor or health care professional as soon as possible: -blurred vision -dry mouth -skin rash -sweating -the feeling of extreme pressure in the head -unusually weak or tired Side effects that usually do not require medical attention (report to your doctor or health care professional if they continue or are bothersome): -flushing of the face or neck -headache -irregular heartbeat, palpitations -nausea,  vomiting This list may not describe all possible side effects. Call your doctor for medical advice about side effects. You may report side effects to FDA at 1-800-FDA-1088. Where should I keep my medicine? Keep out of the reach of children. Store at room temperature between 20 and 25 degrees C (68 and 77 degrees F). Store in Retail buyer. Protect from light and moisture. Keep tightly closed. Throw away any unused medicine after the expiration date. NOTE: This sheet is a summary. It may not cover all possible information. If you have questions about this medicine, talk to your doctor, pharmacist, or health care provider.  2018 Elsevier/Gold Standard (2013-08-15 17:57:36)  after the procedure on Monday.         If you need a refill on your cardiac medications before your next appointment, please call your pharmacy.

## 2017-08-05 LAB — BASIC METABOLIC PANEL
BUN/Creatinine Ratio: 25 — ABNORMAL HIGH (ref 10–24)
BUN: 23 mg/dL (ref 8–27)
CALCIUM: 9.3 mg/dL (ref 8.6–10.2)
CO2: 20 mmol/L (ref 20–29)
Chloride: 107 mmol/L — ABNORMAL HIGH (ref 96–106)
Creatinine, Ser: 0.91 mg/dL (ref 0.76–1.27)
GFR, EST AFRICAN AMERICAN: 98 mL/min/{1.73_m2} (ref >59)
GFR, EST NON AFRICAN AMERICAN: 84 mL/min/{1.73_m2} (ref >59)
Glucose: 80 mg/dL (ref 65–99)
POTASSIUM: 4.6 mmol/L (ref 3.5–5.2)
SODIUM: 141 mmol/L (ref 134–144)

## 2017-08-05 LAB — CBC WITH DIFFERENTIAL/PLATELET
BASOS ABS: 0 10*3/uL (ref 0.0–0.2)
Basos: 0 %
EOS (ABSOLUTE): 0.2 10*3/uL (ref 0.0–0.4)
EOS: 2 %
HEMOGLOBIN: 15.1 g/dL (ref 13.0–17.7)
Hematocrit: 44.5 % (ref 37.5–51.0)
IMMATURE GRANS (ABS): 0 10*3/uL (ref 0.0–0.1)
IMMATURE GRANULOCYTES: 0 %
LYMPHS: 20 %
Lymphocytes Absolute: 1.5 10*3/uL (ref 0.7–3.1)
MCH: 30.9 pg (ref 26.6–33.0)
MCHC: 33.9 g/dL (ref 31.5–35.7)
MCV: 91 fL (ref 79–97)
Monocytes Absolute: 0.5 10*3/uL (ref 0.1–0.9)
Monocytes: 6 %
Neutrophils Absolute: 5.5 10*3/uL (ref 1.4–7.0)
Neutrophils: 72 %
PLATELETS: 202 10*3/uL (ref 150–379)
RBC: 4.89 x10E6/uL (ref 4.14–5.80)
RDW: 13.2 % (ref 12.3–15.4)
WBC: 7.6 10*3/uL (ref 3.4–10.8)

## 2017-08-05 LAB — PROTIME-INR
INR: 1.1 (ref 0.8–1.2)
PROTHROMBIN TIME: 11 s (ref 9.1–12.0)

## 2017-08-07 ENCOUNTER — Ambulatory Visit (HOSPITAL_COMMUNITY)
Admission: RE | Admit: 2017-08-07 | Discharge: 2017-08-07 | Disposition: A | Discharge status: Home / Self Care | Payer: Medicare Other | Source: Ambulatory Visit | Attending: Internal Medicine | Admitting: Internal Medicine

## 2017-08-07 ENCOUNTER — Encounter (HOSPITAL_COMMUNITY)
Admission: RE | Disposition: A | Discharge status: Home / Self Care | Payer: Self-pay | Source: Ambulatory Visit | Attending: Internal Medicine

## 2017-08-07 DIAGNOSIS — Z8249 Family history of ischemic heart disease and other diseases of the circulatory system: Secondary | ICD-10-CM | POA: Insufficient documentation

## 2017-08-07 DIAGNOSIS — I25119 Atherosclerotic heart disease of native coronary artery with unspecified angina pectoris: Secondary | ICD-10-CM | POA: Insufficient documentation

## 2017-08-07 DIAGNOSIS — I2584 Coronary atherosclerosis due to calcified coronary lesion: Secondary | ICD-10-CM | POA: Insufficient documentation

## 2017-08-07 DIAGNOSIS — G4700 Insomnia, unspecified: Secondary | ICD-10-CM | POA: Insufficient documentation

## 2017-08-07 DIAGNOSIS — I2510 Atherosclerotic heart disease of native coronary artery without angina pectoris: Secondary | ICD-10-CM | POA: Diagnosis present

## 2017-08-07 DIAGNOSIS — E785 Hyperlipidemia, unspecified: Secondary | ICD-10-CM | POA: Insufficient documentation

## 2017-08-07 DIAGNOSIS — Z87891 Personal history of nicotine dependence: Secondary | ICD-10-CM | POA: Diagnosis not present

## 2017-08-07 DIAGNOSIS — Z7982 Long term (current) use of aspirin: Secondary | ICD-10-CM | POA: Insufficient documentation

## 2017-08-07 DIAGNOSIS — I25118 Atherosclerotic heart disease of native coronary artery with other forms of angina pectoris: Secondary | ICD-10-CM | POA: Diagnosis present

## 2017-08-07 HISTORY — PX: LEFT HEART CATH AND CORONARY ANGIOGRAPHY: CATH118249

## 2017-08-07 SURGERY — LEFT HEART CATH AND CORONARY ANGIOGRAPHY
Anesthesia: LOCAL

## 2017-08-07 MED ORDER — ASPIRIN 81 MG PO CHEW: 81.0000 mg | CHEWABLE_TABLET | ORAL | Status: DC

## 2017-08-07 MED ORDER — MIDAZOLAM HCL 2 MG/2ML IJ SOLN
INTRAMUSCULAR | Status: AC
  Filled 2017-08-07: qty 2

## 2017-08-07 MED ORDER — IOPAMIDOL (ISOVUE-370) INJECTION 76%
INTRAVENOUS | Status: DC | PRN
  Administered 2017-08-07: 55 mL via INTRA_ARTERIAL

## 2017-08-07 MED ORDER — HEPARIN (PORCINE) IN NACL 2-0.9 UNIT/ML-% IJ SOLN
INTRAMUSCULAR | Status: AC | PRN
  Administered 2017-08-07: 1000 mL via INTRA_ARTERIAL

## 2017-08-07 MED ORDER — LIDOCAINE HCL (PF) 1 % IJ SOLN
INTRAMUSCULAR | Status: DC | PRN
Start: 2017-08-07 — End: 2017-08-07
  Administered 2017-08-07: 2 mL via INTRADERMAL

## 2017-08-07 MED ORDER — MIDAZOLAM HCL 2 MG/2ML IJ SOLN
INTRAMUSCULAR | Status: DC | PRN
Start: 2017-08-07 — End: 2017-08-07
  Administered 2017-08-07: 1 mg via INTRAVENOUS

## 2017-08-07 MED ORDER — HEPARIN (PORCINE) IN NACL 2-0.9 UNIT/ML-% IJ SOLN
INTRAMUSCULAR | Status: DC | PRN
  Administered 2017-08-07: 15:00:00 via INTRA_ARTERIAL

## 2017-08-07 MED ORDER — SODIUM CHLORIDE 0.9% FLUSH: 3.0000 mL | INTRAVENOUS | Status: DC | PRN

## 2017-08-07 MED ORDER — SODIUM CHLORIDE 0.9 % IV SOLN: 250.0000 mL | INTRAVENOUS | Status: DC | PRN

## 2017-08-07 MED ORDER — SODIUM CHLORIDE 0.9 % WEIGHT BASED INFUSION: 1.0000 mL/kg/h | INTRAVENOUS | Status: DC

## 2017-08-07 MED ORDER — HEPARIN SODIUM (PORCINE) 1000 UNIT/ML IJ SOLN
INTRAMUSCULAR | Status: DC | PRN
  Administered 2017-08-07: 4000 [IU] via INTRAVENOUS

## 2017-08-07 MED ORDER — HEPARIN (PORCINE) IN NACL 2-0.9 UNIT/ML-% IJ SOLN
INTRAMUSCULAR | Status: AC
  Filled 2017-08-07: qty 1000

## 2017-08-07 MED ORDER — FENTANYL CITRATE (PF) 100 MCG/2ML IJ SOLN
INTRAMUSCULAR | Status: DC | PRN
  Administered 2017-08-07: 50 ug via INTRAVENOUS

## 2017-08-07 MED ORDER — VERAPAMIL HCL 2.5 MG/ML IV SOLN
INTRAVENOUS | Status: AC
  Filled 2017-08-07: qty 2

## 2017-08-07 MED ORDER — FENTANYL CITRATE (PF) 100 MCG/2ML IJ SOLN
INTRAMUSCULAR | Status: AC
  Filled 2017-08-07: qty 2

## 2017-08-07 MED ORDER — SODIUM CHLORIDE 0.9 % WEIGHT BASED INFUSION
3.0000 mL/kg/h | INTRAVENOUS | Status: AC
  Administered 2017-08-07: 3 mL/kg/h via INTRAVENOUS

## 2017-08-07 MED ORDER — SODIUM CHLORIDE 0.9 % IV SOLN: INTRAVENOUS | Status: DC

## 2017-08-07 MED ORDER — LIDOCAINE HCL 2 % IJ SOLN
INTRAMUSCULAR | Status: AC
  Filled 2017-08-07: qty 10

## 2017-08-07 MED ORDER — SODIUM CHLORIDE 0.9% FLUSH: 3.0000 mL | Freq: Two times a day (BID) | INTRAVENOUS | Status: DC

## 2017-08-07 MED ORDER — ISOSORBIDE MONONITRATE ER 30 MG PO TB24: 30.0000 mg | ORAL_TABLET | Freq: Every day | ORAL | 11 refills | Status: DC

## 2017-08-07 MED ORDER — IOPAMIDOL (ISOVUE-370) INJECTION 76%
INTRAVENOUS | Status: AC
  Filled 2017-08-07: qty 100

## 2017-08-07 MED ORDER — HEPARIN SODIUM (PORCINE) 1000 UNIT/ML IJ SOLN
INTRAMUSCULAR | Status: AC
  Filled 2017-08-07: qty 1

## 2017-08-07 SURGICAL SUPPLY — 9 items

## 2017-08-07 NOTE — Interval H&P Note (Signed)
History and Physical Interval Note:  08/07/2017 2:23 PM  John Mcmahon  has presented today for cardiac catheterization, with the diagnosis of accelerating angina.  The various methods of treatment have been discussed with the patient and family. After consideration of risks, benefits and other options for treatment, the patient has consented to  Procedure(s): LEFT HEART CATH AND CORONARY ANGIOGRAPHY (N/A) as a surgical intervention .  The patient's history has been reviewed, patient examined, no change in status, stable for surgery.  I have reviewed the patient's chart and labs.  Questions were answered to the patient's satisfaction.    Cath Lab Visit (complete for each Cath Lab visit)  Clinical Evaluation Leading to the Procedure:   ACS: No. Accelerating angina x 6 months.  Non-ACS:    Anginal Classification: CCS III  Anti-ischemic medical therapy: No Therapy  Non-Invasive Test Results: No non-invasive testing performed; high risk coronary calcium score  Prior CABG: No previous CABG  Sreenidhi Ganson

## 2017-08-07 NOTE — H&P (View-Only) (Signed)
New Outpatient Visit Date: 08/04/2017  Referring Provider: Marton Redwood, MD [address] [place], [place] [address]  Chief Complaint: Fatigue and abnormal coronary calcium score  HPI:  [name] is a 71 y.o. male who is being seen today for the evaluation of fatigue, shortness of breath, and abnormal coronary calcium score at the request of Dr. Brigitte Mcmahon. He has a history of hyperlipidemia and impaired fasting glucose. Over the last few months, [name] has noted increasing fatigue and shortness of breath with modest activity. Twice this summer he had stopped cleaning out his garage because he felt as though he had "hit a wall." He has also noticed a dull, difficult to describe discomfort in the center of his chest when walking. He typically walks through the pain. The pain gradually disappears 15 to 60 minutes after he stops walking. He is also noted some occasional lightheadedness when standing or exerting himself. As part of a recent physical with Dr. Brigitte Mcmahon, [name] underwent coronary calcium scoring, which was quite high at 952.  [name] has tried multiple cholesterol medications in the past, including simvastatin rosuvastatin, and ezetimibe. He has noted lightheadedness and insomnia with these agents, forcing him to stop. He most recently tried rosuvastatin earlier this month and had to discontinue it after about a week.  [name] underwent a stress test as part of a work physical about 25 years ago and states that it was normal. He has otherwise not undergone any further cardiac testing other than the aforementioned coronary calcium score. He has been checking his blood pressure at home and notes that it is typically normal. Sometimes, however, his resting heart rate seems elevated near 100 bpm. He denies frank palpitations, as well as orthopnea, PND, claudication, and edema.  --------------------------------------------------------------------------------------------------  Cardiovascular  History & Procedures: Cardiovascular Problems:  Accelerating angina with high risk coronary calcium score  Risk Factors:  Coronary artery calcification, hyperlipidemia, male gender, age greater than 53  Cath/PCI:  None  CV Surgery:  None  EP Procedures and Devices:  None  Non-Invasive Evaluation(s):  Coronary calcium score (06/29/17): Calcium score of 952 with extensive calcifications in the LAD him a as well as less prominent calcification involving the LMCA, LCx, and RCA. Slight aneurysmal dilation of ascending aortic, measuring 4.0 cm. 18 mm left lower lobe nodule.Marland Kitchen  --------------------------------------------------------------------------------------------------  Past Medical History:  Diagnosis Date  . Acute meniscal tear, lateral, left, initial encounter   . ED (erectile dysfunction)   . Elevated blood pressure reading in office with white coat syndrome, without diagnosis of hypertension   . Hyperlipidemia   . IFG (impaired fasting glucose)   . Insomnia   . Lumbago     Past Surgical History:  Procedure Laterality Date  . CATARACT EXTRACTION      Current Meds  Medication Sig  . aspirin EC 81 MG tablet Take 81 mg by mouth daily.  . Ibuprofen-Diphenhydramine HCl (ADVIL PM) 200-25 MG CAPS Take 1 tablet by mouth at bedtime.  . Melatonin 5 MG CAPS Take 1 capsule by mouth at bedtime.  . [DISCONTINUED] Melatonin-Pyridoxine (MELATIN PO) Take 1 tablet by mouth at bedtime.    Allergies: Other  Social History   Social History  . Marital status: Unknown    Spouse name: N/A  . Number of children: N/A  . Years of education: N/A   Occupational History  . Not on file.   Social History Main Topics  . Smoking status: Former Smoker    Packs/day: 1.00  Years: 2.00    Types: Cigarettes    Quit date: 08/03/1969  . Smokeless tobacco: Never Used  . Alcohol use 1.8 oz/week    3 Glasses of wine per week  . Drug use: No  . Sexual activity: Not on file   Other  Topics Concern  . Not on file   Social History Narrative  . No narrative on file    Family History  Problem Relation Age of Onset  . Multiple myeloma Mother   . Cancer - Other Mother 74       BREAST  . Dementia Father   . Cancer - Other Maternal Grandmother   . Diabetes Maternal Grandmother   . Heart attack Maternal Grandfather 65  . Dementia Paternal Grandfather   . Healthy Son   . Healthy Son   . Healthy Daughter   . Heart attack Cousin     Review of Systems: Review of Systems  Constitutional: Negative.   HENT: Negative.   Eyes: Negative.   Respiratory: Positive for shortness of breath (with activity).   Cardiovascular: Positive for chest pain.  Gastrointestinal: Negative.   Genitourinary: Negative.   Musculoskeletal: Positive for back pain.  Skin: Negative.   Neurological: Positive for dizziness.  Endo/Heme/Allergies: Negative.   Psychiatric/Behavioral: Negative.    --------------------------------------------------------------------------------------------------  Physical Exam: BP 124/80   Mcmahon 65   Ht _0  (1.778 m)   Wt 169 lb 12.8 oz (77 kg)   SpO2 95%   BMI 24.36 kg/m   General:  Well-developed, well-nourished man, seated comfortably in the exam room. HEENT: No conjunctival pallor or scleral icterus. Moist mucous membranes. OP clear. Neck: Supple without lymphadenopathy, thyromegaly, JVD, or HJR. No carotid bruit. Lungs: Normal work of breathing. Clear to auscultation bilaterally without wheezes or crackles. Heart: Regular rate and rhythm without murmurs, rubs, or gallops. Non-displaced PMI. Abd: Bowel sounds present. Soft, NT/ND without hepatosplenomegaly Ext: No lower extremity edema. Radial, PT, and DP pulses are 2+ bilaterally Skin: Warm and dry without rash. Neuro: CNIII-XII intact. Strength and fine-touch sensation intact in upper and lower extremities bilaterally. Psych: Normal mood and affect.  EKG:  Normal sinus rhythm without  abnormalities.  Outside labs (06/21/17): Sodium 139, potassium 4.8, chloride 107, CO2 23, BUN 20, creatinine 0.8, glucose 113, calcium 9.1, AST 21, ALT 24, alkaline phosphatase 85, total bilirubin 1.6, total protein 6.6, albumin 3.6  WBC 5.2, HGB 16.1, HCT 45.9, platelets 171  Total cholesterol 195, triglycerides 92, HDL 35, LDL 142  TSH 1.96  --------------------------------------------------------------------------------------------------  ASSESSMENT AND PLAN: Coronary artery calcification with accelerating angina [name] reports worsening fatigue, dyspnea on exertion, and exertional chest discomfort over the last 6 months. Coupled with his cardiac factors and high coronary calcium score, I am concerned that this represents accelerating. We have discussed further workup options, including stress testing and cardiac catheterization. We have agreed to proceed with cardiac catheterization, given his high pretest probability. I have arranged for catheterization on Monday (08/07/17). I have reviewed the risks, indications, and alternatives to cardiac catheterization, possible angioplasty, and stenting with the patient. Risks include but are not limited to bleeding, infection, vascular injury, stroke, myocardial infection, arrhythmia, kidney injury, radiation-related injury in the case of prolonged fluoroscopy use, emergency cardiac surgery, and death. The patient understands the risks of serious complication is 1-2 in 4782 with diagnostic cardiac cath and 1-2% or less with angioplasty/stenting. I have asked [name] to continue with low-dose aspirin. I have also given him a prescription for sublingual nitroglycerin  to be taken as needed.  Hyperlipidemia LDL moderately elevated on most recent check by Dr. Brigitte Mcmahon. Given high degree of coronary artery calcification and angina, [name] certainly would benefit from lipid therapy. Unfortunately, he has been intolerant of multiple agents in the past. We  spoke about additional treatment options, including PCSK9 inhibitors. We will refer him to the lipid clinic after his cardiac catheterization for further discussion of treatment options.  Follow-up: To be determined based on results of catheterization.  Nelva Bush, MD 08/04/2017 1:03 PM

## 2017-08-07 NOTE — Discharge Instructions (Signed)

## 2017-08-07 NOTE — Brief Op Note (Signed)
Brief Cardiac Catheterization Note  Date: 08/07/2017 Time: 3:07 PM  PATIENT:  [name]  71 y.o. male  PRE-OPERATIVE DIAGNOSIS:  Accelerating angina  POST-OPERATIVE DIAGNOSIS:  Same  PROCEDURE:  Procedure(s): LEFT HEART CATH AND CORONARY ANGIOGRAPHY (N/A)  FINDINGS: 1. Mild diffuse proximal/mid LAD disease. 40-50% distal LAD disease. 50% proximal RCA stenosis. 2. Normal left ventricular filling pressure. 3. Normal LV contraction.  RECOMMENDATIONS: 1. Medical therapy, including addition of isosorbide mononitrate. 2. Referral to lipid clinic to discuss PCSK9 inhibitor.  Yvonne Kendall, MD [facility] Pager: [phone]

## 2017-08-08 ENCOUNTER — Encounter (HOSPITAL_COMMUNITY): Payer: Self-pay | Admitting: Internal Medicine

## 2017-08-08 ENCOUNTER — Telehealth: Payer: Self-pay | Admitting: *Deleted

## 2017-08-08 MED FILL — Lidocaine HCl Local Inj 2%: INTRAMUSCULAR | Qty: 10 | Status: AC

## 2017-08-08 NOTE — Telephone Encounter (Signed)
  Can you have Mr. Easler f/u with me or an APP in a week or two (shortly before he goes out of town at the end of October)? Also, can you set him up to be seen in the lipid clinic due to angina and statin intolerance? The lipid clinic visit could be before or after his trip. Thanks.   Thayer Ohm   Done-pt aware Lilian Coma 08/15/17--Lipid Clinic 09/11/17

## 2017-08-14 NOTE — Progress Notes (Signed)
Cardiology Office Note:    Date:  08/15/2017   ID:  John Mcmahon, DOB Dec 23, 1945, [name] [id number]  PCP:  John Clan, MD  Cardiologist:  Dr. Yvonne Mcmahon    Referring MD: John Clan, MD   Chief Complaint  Patient presents with  . Coronary Artery Disease    Follow-up post catheterization    History of Present Illness:    John Mcmahon is a 71 y.o. male with a hx of hyperlipidemia, impaired fasting glucose.  He was evaluated by Dr. Okey Mcmahon 08/04/17 for increasing fatigue and shortness of breath with modest activity.  He also reported chest discomfort with exertion.  He had calcium scoring performed with his primary care physician prior to referral which was quite high at 952.  He is intolerant to statin medications as well as ezetimibe.  Cardiac catheterization was recommended to further evaluate his symptoms.  This demonstrated mild to moderate noncritical CAD in all 3 coronary arteries.  Medical therapy was adjusted with the addition of isosorbide mononitrate.  It was felt that if the patient continued to have anginal symptoms despite optimal medical therapy, FFR guided PCI of the mid LAD and/or proximal RCA could be considered.  He has been referred to our lipid clinic to discuss initiation of PCSK-9 inhibitor therapy.  [name] returns for post cardiac catheterization follow-up.  He is here alone.  Since starting on isosorbide, he has done well without recurrent exertional dyspnea or chest discomfort.  He has not experienced edema, paroxysmal nocturnal dyspnea or syncope.  His right radial site is well-healed without pain or swelling.  Prior CV studies:   The following studies were reviewed today:  Cardiac catheterization 08/07/17 LAD ostial 40, proximal 50, D2 25 RI 40 LCx okay, L PDA 40 RCA proximal 50, mid 25 EF 55-65  Cardiac calcium scoring 06/29/17 IMPRESSION: Total coronary calcium score of 952 with extensive calcifications noted in the left anterior descending  artery, with calcifications also noted in the left main, left circumflex, and right coronary arteries. This is eighty-third percentile based on age and gender controlled group. Aortic atherosclerosis. Slight aneurysmal dilatation of the ascending thoracic aorta, 4 cm. Recommend annual imaging followup by CTA or MRA. This recommendation follows 2010 ACCF/AHA/AATS/ACR/ASA/SCA/SCAI/SIR/STS/SVM Guidelines for the Diagnosis and Management of Patients with Thoracic Aortic Disease. Circulation. 2010; 121: W098-J191 A 18 mm pulmonary nodule in the left lower lobe posteriorly at the left base. Consider one of the following in 3 months for both low-risk and high-risk individuals: (a) repeat chest CT, (b) follow-up PET-CT, or (c) tissue sampling. This recommendation follows the consensus statement: Guidelines for Management of Incidental Pulmonary Nodules Detected on CT Images: From the Fleischner Society 2017; Radiology 2017; 284:228-243.  Past Medical History:  Diagnosis Date  . Acute meniscal tear, lateral, left, initial encounter   . ED (erectile dysfunction)   . Elevated blood pressure reading in office with white coat syndrome, without diagnosis of hypertension   . Hyperlipidemia   . IFG (impaired fasting glucose)   . Insomnia   . Lumbago      Current Medications: Current Meds  Medication Sig  . aspirin EC 81 MG tablet Take 81 mg by mouth daily.  . Ibuprofen-Diphenhydramine HCl (ADVIL PM) 200-25 MG CAPS Take 1 tablet by mouth at bedtime.  . isosorbide mononitrate (IMDUR) 30 MG 24 hr tablet Take 1 tablet (30 mg total) by mouth daily.  . Melatonin 5 MG CAPS Take 1 capsule by mouth at bedtime.  . nitroGLYCERIN (NITROSTAT)  0.4 MG SL tablet Place 1 tablet (0.4 mg total) under the tongue every 5 (five) minutes as needed for chest pain.     Allergies:   Other   Social History  Substance Use Topics  . Smoking status: Former Smoker    Packs/day: 1.00    Years: 2.00    Types:  Cigarettes    Quit date: 08/03/1969  . Smokeless tobacco: Never Used  . Alcohol use 1.8 oz/week    3 Glasses of wine per week     ROS:   Please see the history of present illness.    ROS All other systems reviewed and are negative.   EKGs/Labs/Other Test Reviewed:    EKG:  EKG is  ordered today.  The ekg ordered today demonstrates normal sinus rhythm, HR 67, normal axis, low voltage, QTC 407 ms, no significant change when compared to prior tracing  Recent Labs: 08/04/2017: BUN 23; Creatinine, Ser 0.91; Hemoglobin 15.1; Platelets 202; Potassium 4.6; Sodium 141   Recent Lipid Panel No results found for: CHOL, TRIG, HDL, CHOLHDL, LDLCALC, LDLDIRECT  Physical Exam:    VS:  BP 130/70   Pulse 67   Ht  (1.778 m)   Wt 175 lb 6.4 oz (79.6 kg)   SpO2 94%   BMI 25.17 kg/m     Wt Readings from Last 3 Encounters:  08/15/17 175 lb 6.4 oz (79.6 kg)  08/07/17 170 lb (77.1 kg)  08/04/17 169 lb 12.8 oz (77 kg)     Physical Exam  Constitutional: He is oriented to person, place, and time. He appears well-developed and well-nourished. No distress.  HENT:  Head: Normocephalic and atraumatic.  Neck: No JVD present.  Cardiovascular: Normal rate and regular rhythm.   No murmur heard. Pulmonary/Chest: Effort normal. He has no rales.  Abdominal: Soft.  Musculoskeletal: He exhibits no edema or deformity (Right wrist without hematoma).  Neurological: He is alert and oriented to person, place, and time.  Psychiatric: He has a normal mood and affect.    ASSESSMENT:    1. Coronary artery disease involving native coronary artery of native heart with angina pectoris (HCC)   2. Hyperlipidemia, unspecified hyperlipidemia type    PLAN:    In order of problems listed above:  1. Coronary artery disease involving native coronary artery of native heart with angina pectoris (HCC) -  Moderate three-vessel CAD noted by recent cardiac catheterization.  He is doing well without recurrent angina on  his current medical therapy which includes aspirin and isosorbide.  He no longer takes PDE-5 inhibitors.  If this becomes problematic in the future, we could always consider changing from isosorbide to amlodipine.  He prefers to remain on isosorbide for now.  Of note, CT prior to referral to cardiology did demonstrate 18 mm left lower lobe nodule.  This is followed by primary care.  2. Hyperlipidemia, unspecified hyperlipidemia type Intolerant to statins.  He will see our Pharm.D. in the lipid clinic in several weeks to discuss PCSK-9 inhibitor therapy.   Dispo:  Return in about 3 months (around 11/15/2017) for Routine Follow Up, w/ Dr. Okey Mcmahon.   Medication Adjustments/Labs and Tests Ordered: Current medicines are reviewed at length with the patient today.  Concerns regarding medicines are outlined above.  Tests Ordered: Orders Placed This Encounter  Procedures  . EKG 12-Lead   Medication Changes: No orders of the defined types were placed in this encounter.   Signed, Tereso Newcomer, PA-C  08/15/2017 9:32 AM  [facility] [place], [place], [place]  [address] Phone: [phone]; Fax: [phone]

## 2017-08-15 ENCOUNTER — Encounter: Payer: Self-pay | Admitting: Physician Assistant

## 2017-08-15 ENCOUNTER — Ambulatory Visit (INDEPENDENT_AMBULATORY_CARE_PROVIDER_SITE_OTHER): Payer: Medicare Other | Admitting: Physician Assistant

## 2017-08-15 VITALS — BP 130/70 | HR 67 | Ht 70.0 in | Wt 175.4 lb

## 2017-08-15 DIAGNOSIS — I25119 Atherosclerotic heart disease of native coronary artery with unspecified angina pectoris: Secondary | ICD-10-CM

## 2017-08-15 DIAGNOSIS — E785 Hyperlipidemia, unspecified: Secondary | ICD-10-CM | POA: Diagnosis not present

## 2017-08-15 DIAGNOSIS — I200 Unstable angina: Secondary | ICD-10-CM

## 2017-08-15 DIAGNOSIS — I209 Angina pectoris, unspecified: Secondary | ICD-10-CM

## 2017-08-15 NOTE — Patient Instructions (Addendum)
Medication Instructions:  No changes.   Labwork: None   Testing/Procedures: None   Follow-Up: Keep your follow up with the Lipid Clinic on 09/11/17  Dr. Cristal Deer End on 11/16/17 @ 8:20    Any Other Special Instructions Will Be Listed Below (If Applicable).  If you need a refill on your cardiac medications before your next appointment, please call your pharmacy.

## 2017-09-08 NOTE — Progress Notes (Deleted)
Patient ID: John Mcmahon                 DOB: 1946/06/12                    [name]: [id number]     HPI: John Mcmahon is a 71 y.o. male patient referred to lipid clinic by Dr. Richardson Dopp to discuss initiation of PCSK-9 inhibitor therapy. PMH is significant for CAD, HLD, and impaired fasting glucose. PCP reported a calcium score of 952 on 06/29/17, which is 83rd percentile based on age and gender. Moderate 3 vessel CAD noted by recent cardiac cath.  Current Medications: None  Intolerances: simvastatin, rosuvastatin, and ezetimibe - lightheadedness and insomnia, retried rosuvastatin 07/2017 and had to stop after a week for the same reasons  Risk Factors: Calcium score 952, CAD  LDL goal: < 100 mg/dL  Diet:  Breakfast: Lunch: Dinner: Drinks:  Exercise:   Family History: Multiple myeloma in mother. Dementia in father. CA and DM in maternal grandmother. MI in maternal grandfather. Dementia in paternal grandfather. MI in cousin.  Social History: Former smoker 1PPD for 2 years, quit in 1970. Has 3 glasses of wine per week.  Most recent lipid panel: 08/01/17 TC 195, TG 92, HDL 35, LDL 142  Past Medical History:  Diagnosis Date  . Acute meniscal tear, lateral, left, initial encounter   . ED (erectile dysfunction)   . Elevated blood pressure reading in office with white coat syndrome, without diagnosis of hypertension   . Hyperlipidemia   . IFG (impaired fasting glucose)   . Insomnia   . Lumbago     Current Outpatient Medications on File Prior to Visit  Medication Sig Dispense Refill  . aspirin EC 81 MG tablet Take 81 mg by mouth daily.    . Ibuprofen-Diphenhydramine HCl (ADVIL PM) 200-25 MG CAPS Take 1 tablet by mouth at bedtime.    . isosorbide mononitrate (IMDUR) 30 MG 24 hr tablet Take 1 tablet (30 mg total) by mouth daily. 30 tablet 11  . Melatonin 5 MG CAPS Take 1 capsule by mouth at bedtime.    . nitroGLYCERIN (NITROSTAT) 0.4 MG SL tablet Place 1 tablet (0.4 mg total)  under the tongue every 5 (five) minutes as needed for chest pain. 100 tablet 3   No current facility-administered medications on file prior to visit.     Allergies  Allergen Reactions  . Other Other (See Comments)    THIMRISOL  *PRESERVATIVE     Assessment/Plan:  1. Hyperlipidemia -

## 2017-09-11 ENCOUNTER — Ambulatory Visit (INDEPENDENT_AMBULATORY_CARE_PROVIDER_SITE_OTHER): Payer: Medicare Other | Admitting: Pharmacist

## 2017-09-11 DIAGNOSIS — E785 Hyperlipidemia, unspecified: Secondary | ICD-10-CM | POA: Diagnosis not present

## 2017-09-11 DIAGNOSIS — I200 Unstable angina: Secondary | ICD-10-CM

## 2017-09-11 NOTE — Patient Instructions (Addendum)
It was great to meet you today.  We will submit authorization to your insurance for a PCSK9-inhibitor (injectable cholesterol medication). We will follow-up with you when we hear back from the insurance company.

## 2017-09-11 NOTE — Progress Notes (Signed)
Patient ID: John Mcmahon                 DOB: 07/25/1946                    [name]: [id number]     HPI: John Mcmahon is a 71 y.o. male patient of Dr. Saunders Revel referred to lipid clinic by Richardson Dopp, PA-C. PMH is significant for CAD, hyperlipidemia, and impaired glucose tolerance. Pt had noticed increasing fatigue and SOB with activity with associated dull CP which improved with rest so he underwent coronary calcium scoring which was elevated at 952. Subsequent cardiac cath on 08/07/17 showed mild to moderate CAD of all 3 coronary arteries which has been managed medically. Pt has history of multiple statin intolerances and intolerance to ezetimibe as well, but LDL remains elevated at 142 mg/dl.   Pt presents today to discuss PCSK9 inhibitors. Pt was first prescribed a statin 4-5 years ago when atorvastatin and he has since been prescribed rosuvastatin and simvastatin as well. Everytime he started a new statin he would develop difficulty remaining asleep. This typically occurs within 2-3 days of starting the statin and goes away after a week of discontinuing. He has no problems falling asleep, only remaining asleep. Pt reports he also developed myalgias with atorvastatin and simvastatin. Zetia was tried as well and this too caused sleep disturbances. Pt most recently tried rosuvastatin October 2018 but experienced the same AE. He endorses never having tried lower doses of statins, he was always simply switched to a new agent, though he does not remember specific doses.  Current Medications: none  Intolerances: atorvastatin (myalgia, sleep disturbance), simvastatin (myalgia, sleep disturbance), rosuvastatin (sleep disturbance), ezetimibe (sleep disturbance).  Risk Factors: CAD, HLD, family history CAD, impaired glucose tolerance  LDL goal: <70 mg/dl  Diet: Breakfast - bowl of cereal with bananas, Lunch - baked chicken, cheese, tuna, Dinner - grilled meat and 2 steamed/fresh vegetables. Avoids sugar and  processed foods.   Exercise: Walks 2-3 miles/day 5x per week.  Family History: Multiple myeloma in mother. Dementia in father. CAD and DM in maternal grandmother. MI in maternal grandfather. Dementia in paternal grandfather. MI in cousin.  Social History: Former smoker 1PPD for 2 years, quit in 1970. Has 3 glasses of wine per week.  Labs: Lipid panel 06/21/17: TC 195, TG 92, HDL 35, LDL 142 (no therapy)  Past Medical History:  Diagnosis Date  . Acute meniscal tear, lateral, left, initial encounter   . ED (erectile dysfunction)   . Elevated blood pressure reading in office with white coat syndrome, without diagnosis of hypertension   . Hyperlipidemia   . IFG (impaired fasting glucose)   . Insomnia   . Lumbago     Current Outpatient Medications on File Prior to Visit  Medication Sig Dispense Refill  . aspirin EC 81 MG tablet Take 81 mg by mouth daily.    . Ibuprofen-Diphenhydramine HCl (ADVIL PM) 200-25 MG CAPS Take 1 tablet by mouth at bedtime.    . isosorbide mononitrate (IMDUR) 30 MG 24 hr tablet Take 1 tablet (30 mg total) by mouth daily. 30 tablet 11  . Melatonin 5 MG CAPS Take 1 capsule by mouth at bedtime.    . nitroGLYCERIN (NITROSTAT) 0.4 MG SL tablet Place 1 tablet (0.4 mg total) under the tongue every 5 (five) minutes as needed for chest pain. 100 tablet 3   No current facility-administered medications on file prior to visit.     Allergies  Allergen Reactions  . Other Other (See Comments)    THIMRISOL  *PRESERVATIVE     Assessment/Plan:  1. Hyperlipidemia - LDL above goal <70 mg/dl secondary to statin intolerance. Pt is active and limits carbohydrates and fats within his diet well. Pt encouraged to continue limiting dietary fat intake and continue walking most days of the week. Discussed cholesterol-lowering options including retrial of statin therapy or initiating PCSK9 inhibitor therapy and pt prefers PSCK9 inhibitor. Will plan to initiate Praluent 180m SQ every  2 weeks pending insurance approval. Will submit prior authorization for Praluent to insurance and F/U with pt via telephone.  MArrie Senate PharmD PGY-2 Cardiology Pharmacy Resident Pager: [phone]/09/2017

## 2017-09-14 ENCOUNTER — Telehealth: Payer: Self-pay | Admitting: Pharmacist

## 2017-09-14 MED ORDER — ALIROCUMAB 150 MG/ML ~~LOC~~ SOPN: 1.0000 "pen " | PEN_INJECTOR | SUBCUTANEOUS | 11 refills | Status: DC

## 2017-09-14 NOTE — Telephone Encounter (Signed)
Called pt and left voicemail regarding prior authorization for Praluent. Authorization has been received but copay is likely to be cost-prohibitive at $477/month. Will discuss with pt upon call back but will likely need to pursue patient assistance.

## 2017-09-14 NOTE — Telephone Encounter (Signed)
Praluent PA approved. Rx sent to specialty pharmacy to determine if copay is affordable.

## 2017-09-15 MED ORDER — PRAVASTATIN SODIUM 20 MG PO TABS: 20.0000 mg | ORAL_TABLET | Freq: Every evening | ORAL | 11 refills | Status: DC

## 2017-09-15 NOTE — Telephone Encounter (Signed)
Spoke with pt - he will not qualify for patient assistance with Praluent. He is willing to try pravastatin 20mg  HS. He will call with tolerability. He is looking at different insurances for 2019. Can resubmit PCSK9i in 2019 and see if copay has come down on new insurance.

## 2017-09-15 NOTE — Addendum Note (Signed)
Addended by: SUPPLE, MEGAN E on: 09/15/2017 09:53 AM   Modules accepted: Orders

## 2017-09-18 ENCOUNTER — Telehealth: Payer: Self-pay | Admitting: Internal Medicine

## 2017-09-18 NOTE — Telephone Encounter (Signed)
New  Message    Patient calling to speak with Casimiro NeedleMichael, to follow up on phone call from 11/15. Please call

## 2017-09-18 NOTE — Telephone Encounter (Signed)
Returned patient's call regarding Medicare coverage. Pt is deciding on 2019 plan and whether to change. Discussed with pt the fact that we cannot guarantee PCSK9 inhibitor would remain covered on a new plan but it likely would be, and discussed that different Medicare plans have different co-pays and preferred PCSK9 inhibitors on formulary. Pt is planning to touch base with insurance company to get this information and will call back clinic.

## 2017-09-29 ENCOUNTER — Telehealth: Payer: Self-pay | Admitting: Internal Medicine

## 2017-09-29 NOTE — Telephone Encounter (Signed)
Patient calling, requests to speak with you.

## 2017-10-02 NOTE — Telephone Encounter (Signed)
LMTCB

## 2017-10-03 ENCOUNTER — Other Ambulatory Visit: Payer: Self-pay | Admitting: Internal Medicine

## 2017-10-03 DIAGNOSIS — R911 Solitary pulmonary nodule: Secondary | ICD-10-CM

## 2017-10-03 NOTE — Telephone Encounter (Signed)
Pt returned call to clinic - [facility] formul[facility] lists Repatha as Tier 3 and Praluent as Tier 4. He will switch to this insurance for 2019 and I advised him to bring in his new insurance card once it's active so that we can submit a new prior authorization for Repatha that is hopefully more affordable.

## 2017-10-10 ENCOUNTER — Ambulatory Visit
Admission: RE | Admit: 2017-10-10 | Discharge: 2017-10-10 | Disposition: A | Discharge status: Home / Self Care | Payer: Medicare Other | Source: Ambulatory Visit | Attending: Internal Medicine | Admitting: Internal Medicine

## 2017-10-10 ENCOUNTER — Other Ambulatory Visit: Payer: Self-pay

## 2017-10-10 DIAGNOSIS — R918 Other nonspecific abnormal finding of lung field: Secondary | ICD-10-CM | POA: Diagnosis not present

## 2017-10-10 DIAGNOSIS — R911 Solitary pulmonary nodule: Secondary | ICD-10-CM

## 2017-10-17 NOTE — Telephone Encounter (Signed)
Pt [facility] in with [facility] 2019 insurance plan.  Member ID: [id number] BI: [id number] PCN: [id number] GRP: [id number]  New insurance is active starting on October 31 2017. Will submit prior authorization for Repatha once new insurance plan is active.

## 2017-11-01 MED ORDER — EVOLOCUMAB 140 MG/ML ~~LOC~~ SOAJ: 1.0000 "pen " | SUBCUTANEOUS | 11 refills | Status: DC

## 2017-11-01 NOTE — Telephone Encounter (Signed)
PA approved for Repatha. Rx sent to specialty pharmacy. Will f/u with pt when copay information is available.

## 2017-11-01 NOTE — Addendum Note (Signed)
Addended by: Matthews Franks E on: 11/01/2017 02:12 PM   Modules accepted: Orders

## 2017-11-03 NOTE — Telephone Encounter (Addendum)
Copay affordable at $37 per month. Pt is aware and will call clinic when he receives his first shipment.

## 2017-11-08 ENCOUNTER — Telehealth: Payer: Self-pay | Admitting: Pharmacist

## 2017-11-08 DIAGNOSIS — E782 Mixed hyperlipidemia: Secondary | ICD-10-CM

## 2017-11-08 NOTE — Telephone Encounter (Signed)
Pt called to state he has received his first shipment of Repatha in the mail. Coached pt over the phone with injection technique and he successfully self-administered Repatha into the abdomen. Scheduled f/u lab work in 6 weeks.

## 2017-11-16 ENCOUNTER — Ambulatory Visit: Payer: Medicare Other | Admitting: Internal Medicine

## 2017-11-16 ENCOUNTER — Encounter: Payer: Self-pay | Admitting: Internal Medicine

## 2017-11-16 VITALS — BP 124/82 | HR 75 | Ht 70.0 in | Wt 177.5 lb

## 2017-11-16 DIAGNOSIS — E785 Hyperlipidemia, unspecified: Secondary | ICD-10-CM | POA: Diagnosis not present

## 2017-11-16 DIAGNOSIS — M546 Pain in thoracic spine: Secondary | ICD-10-CM | POA: Diagnosis not present

## 2017-11-16 DIAGNOSIS — Z6824 Body mass index (BMI) 24.0-24.9, adult: Secondary | ICD-10-CM | POA: Diagnosis not present

## 2017-11-16 DIAGNOSIS — I25118 Atherosclerotic heart disease of native coronary artery with other forms of angina pectoris: Secondary | ICD-10-CM

## 2017-11-16 DIAGNOSIS — G589 Mononeuropathy, unspecified: Secondary | ICD-10-CM | POA: Diagnosis not present

## 2017-11-16 NOTE — Patient Instructions (Addendum)
Medication Instructions:  Your physician recommends that you continue on your current medications as directed. Please refer to the Current Medication list given to you today.  -- If you need a refill on your cardiac medications before your next appointment, please call your pharmacy. --  Labwork: None ordered  Testing/Procedures: None ordered  Follow-Up: Your physician wants you to follow-up in: 6 months with Dr. End.    You will receive a reminder letter in the mail two months in advance. If you don't receive a letter, please call our office to schedule the follow-up appointment.  Thank you for choosing [facility]!!    Any Other Special Instructions Will Be Listed Below (If Applicable).         

## 2017-11-16 NOTE — Progress Notes (Signed)
Follow-up Outpatient Visit Date: 11/16/2017  Primary Care Provider: Marton Redwood, MD [place] [address]  Chief Complaint: Follow-up coronary artery disease and hyperlipidemia  HPI:  Mr. Penson is a 72 y.o. year-old male with history of moderate multivessel coronary artery disease, hyperlipidemia, and imparied fasting glucose, who presents for follow-up of coronary artery disease with stable angina. He underwent cardiac catheterization in 07/2017, which revealed mild to moderate CAD involving all 3 coronary arteries. Isosorbide mononitrate was started; Mr. Norfleet reported resolution of symptoms when he followed up with Richardson Dopp, PA, on 08/15/17. He was also seen in our lipid clinic and has been started on Repatha, given history of statin intolerance.  Today, Mr. Dermody reports feeling well. He denies chest pain, shortness of breath, and edema. He is walking 3.5 miles per day without any limitations. He notes some muscle fatigue when doing heavy lifting. He took his first dose of Repatha last week and seems to be tolerating this well. He is due for repeat lipid panel in March.  Over the last few weeks, Mr. Delage has noted a sharp pain in his neck radiating to the right arm. There is some associated numbness. Symptoms seem to be worsen by sitting or standing still for prolonged periods and improves with lying flat on his back. He is scheduled to see Dr. Brigitte Pulse today for further evaluation.  --------------------------------------------------------------------------------------------------  Cardiovascular History & Procedures: Cardiovascular Problems:  Mild to moderate coronary artery disease  Risk Factors:  Known coronary artery disease, hyperlipidemia, male gender, age greater than 100  Cath/PCI:  LHC (08/07/17): LMCA normal. LAD with 50% midvessel stenosis. Codominant LCx with 40% lPDA lesion. 50% proximal and 25% mid RCA lesions. LVEF 55-65%. LVEDP 10 mmHg.  CV  Surgery:  None  EP Procedures and Devices:  None  Non-Invasive Evaluation(s):  Coronary calcium score (06/29/17): Calcium score of 952 with extensive calcifications in the LAD him a as well as less prominent calcification involving the LMCA, LCx, and RCA. Slight aneurysmal dilation of ascending aortic, measuring 4.0 cm. 18 mm left lower lobe nodule.  Recent CV Pertinent Labs: Lab Results  Component Value Date   INR 1.1 08/04/2017   K 4.6 08/04/2017   BUN 23 08/04/2017   CREATININE 0.91 08/04/2017    Past medical and surgical history were reviewed and updated in EPIC.  Current Meds  Medication Sig  . aspirin EC 81 MG tablet Take 81 mg by mouth daily.  . Evolocumab (REPATHA SURECLICK) 956 MG/ML SOAJ Inject 1 pen into the skin every 14 (fourteen) days.  . isosorbide mononitrate (IMDUR) 30 MG 24 hr tablet Take 1 tablet (30 mg total) by mouth daily.  . Melatonin 5 MG CAPS Take 1 capsule by mouth at bedtime.  Marland Kitchen NAPROXEN DR PO Take 400 mg by mouth daily. Per pt takes 2-262m tablets po daily.  . [DISCONTINUED] pravastatin (PRAVACHOL) 20 MG tablet Take 1 tablet (20 mg total) every evening by mouth.    Allergies: Other  Social History   Socioeconomic History  . Marital status: Married    Spouse name: Not on file  . Number of children: Not on file  . Years of education: Not on file  . Highest education level: Not on file  Social Needs  . Financial resource strain: Not on file  . Food insecurity - worry: Not on file  . Food insecurity - inability: Not on file  . Transportation needs - medical: Not on file  . Transportation needs -  non-medical: Not on file  Occupational History  . Not on file  Tobacco Use  . Smoking status: Former Smoker    Packs/day: 1.00    Years: 2.00    Pack years: 2.00    Types: Cigarettes    Last attempt to quit: 08/03/1969    Years since quitting: 48.3  . Smokeless tobacco: Never Used  Substance and Sexual Activity  . Alcohol use: Yes     Alcohol/week: 1.8 oz    Types: 3 Glasses of wine per week  . Drug use: No  . Sexual activity: Not on file  Other Topics Concern  . Not on file  Social History Narrative  . Not on file    Family History  Problem Relation Age of Onset  . Multiple myeloma Mother   . Cancer - Other Mother 9       BREAST  . Dementia Father   . Cancer - Other Maternal Grandmother   . Diabetes Maternal Grandmother   . Heart attack Maternal Grandfather 65  . Dementia Paternal Grandfather   . Healthy Son   . Healthy Son   . Healthy Daughter   . Heart attack Cousin     Review of Systems: A 12-system review of systems was performed and was negative except as noted in the HPI.  --------------------------------------------------------------------------------------------------  Physical Exam: BP 124/82   Pulse 75   Ht _0  (1.778 m)   Wt 177 lb 8 oz (80.5 kg)   SpO2 96%   BMI 25.47 kg/m   General:  Well-developed, well-nourished man, seated comfortably in the exam room. HEENT: No conjunctival pallor or scleral icterus. Moist mucous membranes.  OP clear. Neck: Supple without lymphadenopathy, thyromegaly, JVD, or HJR. Lungs: Normal work of breathing. Clear to auscultation bilaterally without wheezes or crackles. Heart: Regular rate and rhythm without murmurs, rubs, or gallops. Non-displaced PMI. Abd: Bowel sounds present. Soft, NT/ND without hepatosplenomegaly Ext: No lower extremity edema. Radial, PT, and DP pulses are 2+ bilaterally. Skin: Warm and dry without rash.   Lab Results  Component Value Date   WBC 7.6 08/04/2017   HGB 15.1 08/04/2017   HCT 44.5 08/04/2017   MCV 91 08/04/2017   PLT 202 08/04/2017    Lab Results  Component Value Date   NA 141 08/04/2017   K 4.6 08/04/2017   CL 107 (H) 08/04/2017   CO2 20 08/04/2017   BUN 23 08/04/2017   CREATININE 0.91 08/04/2017   GLUCOSE 80 08/04/2017    No results found for: CHOL, HDL, LDLCALC, LDLDIRECT, TRIG,  CHOLHDL  --------------------------------------------------------------------------------------------------  ASSESSMENT AND PLAN: Coronary artery disease with stable angina Symptoms well-controlled on isosorbide mononitrate 30 mg daily. We will continue this and low-dose aspirin. If symptoms were to progress in the future, we will need to consider functional study to evaluate ischemic burden versus repeat cath with FFR of RCA and LAD.  Hyperlipidemia LDL goal < 70 in the setting of stable angina and moderate CAD. Patient recently started on Repatha, which he is tolerating well. Repeat lipid panel planned for March. Continue follow-up with the lipid clinic.  Follow-up: Return to clinic in 6 months.  Nelva Bush, MD 11/16/2017 8:50 AM

## 2017-12-18 ENCOUNTER — Other Ambulatory Visit: Payer: Medicare Other

## 2017-12-20 ENCOUNTER — Other Ambulatory Visit: Payer: Medicare Other | Admitting: *Deleted

## 2017-12-20 DIAGNOSIS — E782 Mixed hyperlipidemia: Secondary | ICD-10-CM

## 2017-12-20 LAB — HEPATIC FUNCTION PANEL
ALK PHOS: 77 IU/L (ref 39–117)
ALT: 22 IU/L (ref 0–44)
AST: 19 IU/L (ref 0–40)
Albumin: 4 g/dL (ref 3.5–4.8)
BILIRUBIN TOTAL: 0.8 mg/dL (ref 0.0–1.2)
Bilirubin, Direct: 0.23 mg/dL (ref 0.00–0.40)
Total Protein: 6.4 g/dL (ref 6.0–8.5)

## 2017-12-20 LAB — LIPID PANEL
CHOLESTEROL TOTAL: 101 mg/dL (ref 100–199)
Chol/HDL Ratio: 2.3 ratio (ref 0.0–5.0)
HDL: 43 mg/dL (ref >39)
LDL Calculated: 43 mg/dL (ref 0–99)
Triglycerides: 74 mg/dL (ref 0–149)
VLDL CHOLESTEROL CAL: 15 mg/dL (ref 5–40)

## 2017-12-21 ENCOUNTER — Other Ambulatory Visit: Payer: Medicare Other

## 2018-02-26 DIAGNOSIS — L570 Actinic keratosis: Secondary | ICD-10-CM | POA: Diagnosis not present

## 2018-02-26 DIAGNOSIS — Z6825 Body mass index (BMI) 25.0-25.9, adult: Secondary | ICD-10-CM | POA: Diagnosis not present

## 2018-02-27 DIAGNOSIS — L821 Other seborrheic keratosis: Secondary | ICD-10-CM | POA: Diagnosis not present

## 2018-02-27 DIAGNOSIS — L570 Actinic keratosis: Secondary | ICD-10-CM | POA: Diagnosis not present

## 2018-05-17 ENCOUNTER — Ambulatory Visit: Payer: Medicare Other | Admitting: Internal Medicine

## 2018-05-17 ENCOUNTER — Encounter (INDEPENDENT_AMBULATORY_CARE_PROVIDER_SITE_OTHER): Payer: Self-pay

## 2018-05-17 ENCOUNTER — Encounter: Payer: Self-pay | Admitting: Internal Medicine

## 2018-05-17 VITALS — BP 118/60 | HR 88 | Ht 70.0 in | Wt 173.0 lb

## 2018-05-17 DIAGNOSIS — I25118 Atherosclerotic heart disease of native coronary artery with other forms of angina pectoris: Secondary | ICD-10-CM

## 2018-05-17 DIAGNOSIS — E785 Hyperlipidemia, unspecified: Secondary | ICD-10-CM | POA: Diagnosis not present

## 2018-05-17 NOTE — Patient Instructions (Addendum)
Medication Instructions:  Your physician recommends that you continue on your current medications as directed. Please refer to the Current Medication list given to you today.  -- If you need a refill on your cardiac medications before your next appointment, please call your pharmacy. --  Labwork: None ordered  Testing/Procedures: None ordered  Follow-Up: Your physician wants you to follow-up in: 1 year with Dr. Okey [facility]   You will receive a reminder letter in the mail two months in advance. If you don't receive a letter, please call our office to schedule the follow-up appointment.  Thank you for choosing [facility]!!    Any Other Special Instructions Will Be Listed Below (If Applicable).

## 2018-05-17 NOTE — Progress Notes (Signed)
Follow-up Outpatient Visit Date: 05/17/2018  Primary Care Provider: Marton Redwood, MD [place] [address]  Chief Complaint: Follow-up CAD  HPI:  Mr. Duffey is a 72 y.o. year-old male with history of moderate multivessel coronary artery disease, hyperlipidemia, and imparied fasting glucose, who presents for follow-up of coronary artery disease.  I last saw him in January, at which time he was feeling well without chest pain or shortness of breath.  He had just started Repatha and tolerated his first dose well.  No interventions were undertaken at that time.  Today, Mr. Miyoshi reports feeling well.  He has not had any chest pain, shortness of breath, lightheadedness, or edema.  He has missed 2 doses of isosorbide mononitrate since it was started and noted a headache the following day when restarting it.  This resolved promptly with naproxen.  He is tolerating Repatha well and was pleased to see how much his LDL had dropped on recheck in February.  He is exercising regularly, walking 3 miles 5 to 6 days a week.  --------------------------------------------------------------------------------------------------  Cardiovascular History & Procedures: Cardiovascular Problems:  Mild to moderate coronary artery disease  Risk Factors:  Known coronary artery disease, hyperlipidemia, male gender, age greater than 74  Cath/PCI:  LHC (08/07/17): LMCA normal. LAD with 50% midvessel stenosis. Codominant LCx with 40% lPDA lesion. 50% proximal and 25% mid RCA lesions. LVEF 55-65%. LVEDP 10 mmHg.  CV Surgery:  None  EP Procedures and Devices:  None  Non-Invasive Evaluation(s):  Coronary calcium score (06/29/17): Calcium score of 952 with extensive calcifications in the LAD him a as well as less prominent calcification involving the LMCA, LCx, and RCA. Slight aneurysmal dilation of ascending aortic, measuring 4.0 cm.18 mmleft lower lobe nodule.   Recent CV Pertinent  Labs: Lab Results  Component Value Date   CHOL 101 12/20/2017   HDL 43 12/20/2017   LDLCALC 43 12/20/2017   TRIG 74 12/20/2017   CHOLHDL 2.3 12/20/2017   INR 1.1 08/04/2017   K 4.6 08/04/2017   BUN 23 08/04/2017   CREATININE 0.91 08/04/2017    Past medical and surgical history were reviewed and updated in EPIC.  Current Meds  Medication Sig  . aspirin EC 81 MG tablet Take 81 mg by mouth daily.  . Evolocumab (REPATHA SURECLICK) 470 MG/ML SOAJ Inject 1 pen into the skin every 14 (fourteen) days.  . Homeopathic Products (ANTACID MEDICINE PO) Take by mouth.  . isosorbide mononitrate (IMDUR) 30 MG 24 hr tablet Take 1 tablet (30 mg total) by mouth daily.  . Melatonin 5 MG CAPS Take 1 capsule by mouth at bedtime.  Marland Kitchen NAPROXEN DR PO Take 400 mg by mouth daily. Per pt takes 2-266m tablets po daily.  . nitroGLYCERIN (NITROSTAT) 0.4 MG SL tablet Place 1 tablet (0.4 mg total) under the tongue every 5 (five) minutes as needed for chest pain.    Allergies: Other  Social History   Tobacco Use  . Smoking status: Former Smoker    Packs/day: 1.00    Years: 2.00    Pack years: 2.00    Types: Cigarettes    Last attempt to quit: 08/03/1969    Years since quitting: 48.8  . Smokeless tobacco: Never Used  Substance Use Topics  . Alcohol use: Yes    Alcohol/week: 1.8 oz    Types: 3 Glasses of wine per week  . Drug use: No    Family History  Problem Relation Age of Onset  . Multiple myeloma  Mother   . Cancer - Other Mother 31       BREAST  . Dementia Father   . Cancer - Other Maternal Grandmother   . Diabetes Maternal Grandmother   . Heart attack Maternal Grandfather 65  . Dementia Paternal Grandfather   . Healthy Son   . Healthy Son   . Healthy Daughter   . Heart attack Cousin     Review of Systems: Notable for chronic intermittent back pain.  Otherwise, a 12-system review of systems was performed and was negative except as noted in the  HPI.  --------------------------------------------------------------------------------------------------  Physical Exam: BP 118/60   Pulse 88   Ht 5' 10"  (1.778 m)   Wt 173 lb (78.5 kg)   BMI 24.82 kg/m   General: NAD. HEENT: No conjunctival pallor or scleral icterus. Moist mucous membranes.  OP clear. Neck: Supple without lymphadenopathy, thyromegaly, JVD, or HJR. Lungs: Normal work of breathing. Clear to auscultation bilaterally without wheezes or crackles. Heart: Regular rate and rhythm without murmurs, rubs, or gallops. Non-displaced PMI. Abd: Bowel sounds present. Soft, NT/ND without hepatosplenomegaly Ext: No lower extremity edema. Radial, PT, and DP pulses are 2+ bilaterally. Skin: Warm and dry without rash.  EKG:  NSR without abnormalities  Lab Results  Component Value Date   WBC 7.6 08/04/2017   HGB 15.1 08/04/2017   HCT 44.5 08/04/2017   MCV 91 08/04/2017   PLT 202 08/04/2017    Lab Results  Component Value Date   NA 141 08/04/2017   K 4.6 08/04/2017   CL 107 (H) 08/04/2017   CO2 20 08/04/2017   BUN 23 08/04/2017   CREATININE 0.91 08/04/2017   GLUCOSE 80 08/04/2017   ALT 22 12/20/2017    Lab Results  Component Value Date   CHOL 101 12/20/2017   HDL 43 12/20/2017   LDLCALC 43 12/20/2017   TRIG 74 12/20/2017   CHOLHDL 2.3 12/20/2017    --------------------------------------------------------------------------------------------------  ASSESSMENT AND PLAN: Coronary artery disease with stable angina Mr. Kawano continues to do well with no significant chest pain on isosorbide mononitrate.  We will continue with this indefinitely as well as secondary prevention with aspirin and Repatha.  Hyperlipidemia LDL well controlled on recheck after starting Repatha.  We will plan to continue this indefinitely.  Mr. Postiglione reports that he is due for lipid panel and routine chemistries with his PCP in the next few months.  Follow-up: Return to clinic in 1 year in  [place].  Nelva Bush, MD 05/17/2018 2:18 PM

## 2018-05-21 DIAGNOSIS — H02831 Dermatochalasis of right upper eyelid: Secondary | ICD-10-CM | POA: Diagnosis not present

## 2018-05-21 DIAGNOSIS — Z961 Presence of intraocular lens: Secondary | ICD-10-CM | POA: Diagnosis not present

## 2018-05-21 DIAGNOSIS — H02834 Dermatochalasis of left upper eyelid: Secondary | ICD-10-CM | POA: Diagnosis not present

## 2018-06-23 DIAGNOSIS — S81802A Unspecified open wound, left lower leg, initial encounter: Secondary | ICD-10-CM | POA: Diagnosis not present

## 2018-06-23 DIAGNOSIS — S81801A Unspecified open wound, right lower leg, initial encounter: Secondary | ICD-10-CM | POA: Diagnosis not present

## 2018-07-10 DIAGNOSIS — Z125 Encounter for screening for malignant neoplasm of prostate: Secondary | ICD-10-CM | POA: Diagnosis not present

## 2018-07-10 DIAGNOSIS — R82998 Other abnormal findings in urine: Secondary | ICD-10-CM | POA: Diagnosis not present

## 2018-07-10 DIAGNOSIS — R7301 Impaired fasting glucose: Secondary | ICD-10-CM | POA: Diagnosis not present

## 2018-07-10 DIAGNOSIS — E785 Hyperlipidemia, unspecified: Secondary | ICD-10-CM | POA: Diagnosis not present

## 2018-07-10 DIAGNOSIS — E7849 Other hyperlipidemia: Secondary | ICD-10-CM | POA: Diagnosis not present

## 2018-07-17 DIAGNOSIS — Z0000 Encounter for general adult medical examination without abnormal findings: Secondary | ICD-10-CM | POA: Diagnosis not present

## 2018-07-17 DIAGNOSIS — E7849 Other hyperlipidemia: Secondary | ICD-10-CM | POA: Diagnosis not present

## 2018-07-17 DIAGNOSIS — R7301 Impaired fasting glucose: Secondary | ICD-10-CM | POA: Diagnosis not present

## 2018-07-17 DIAGNOSIS — R030 Elevated blood-pressure reading, without diagnosis of hypertension: Secondary | ICD-10-CM | POA: Diagnosis not present

## 2018-07-18 DIAGNOSIS — Z1212 Encounter for screening for malignant neoplasm of rectum: Secondary | ICD-10-CM | POA: Diagnosis not present

## 2018-08-13 ENCOUNTER — Other Ambulatory Visit: Payer: Self-pay | Admitting: Internal Medicine

## 2018-08-13 NOTE — Telephone Encounter (Signed)
Please review for refill.  

## 2018-09-12 DIAGNOSIS — Z23 Encounter for immunization: Secondary | ICD-10-CM | POA: Diagnosis not present

## 2018-09-25 ENCOUNTER — Other Ambulatory Visit: Payer: Self-pay | Admitting: Internal Medicine

## 2018-10-01 ENCOUNTER — Telehealth: Payer: Self-pay | Admitting: Internal Medicine

## 2018-10-01 MED ORDER — EVOLOCUMAB 140 MG/ML ~~LOC~~ SOAJ: 1.0000 "pen " | SUBCUTANEOUS | 3 refills | Status: DC

## 2018-10-01 NOTE — Telephone Encounter (Signed)
New Message    *STAT* If patient is at the pharmacy, call can be transferred to refill team.  [facility] prescription for the one month supply but is requesting a 3 month supply since pt is not seen until feb  1. Which medications need to be refilled? (please list name of each medication and dose if known) REPATHA SURECLICK 140 MG/ML SOAJ  2. Which pharmacy/location (including street and city if local pharmacy) is medication to be sent to? [facility] [phone] [place] [place] [facility] [address]  3. Do they need a 30 day or 90 day supply? 90

## 2018-10-01 NOTE — Telephone Encounter (Signed)
3 month supply refill has been sent in.

## 2018-10-16 ENCOUNTER — Other Ambulatory Visit: Payer: Self-pay | Admitting: Internal Medicine

## 2018-10-16 DIAGNOSIS — I712 Thoracic aortic aneurysm, without rupture, unspecified: Secondary | ICD-10-CM

## 2018-11-13 ENCOUNTER — Ambulatory Visit
Admission: RE | Admit: 2018-11-13 | Discharge: 2018-11-13 | Disposition: A | Discharge status: Home / Self Care | Payer: Medicare Other | Source: Ambulatory Visit | Attending: Internal Medicine | Admitting: Internal Medicine

## 2018-11-13 DIAGNOSIS — I712 Thoracic aortic aneurysm, without rupture, unspecified: Secondary | ICD-10-CM

## 2018-11-13 MED ORDER — IOPAMIDOL (ISOVUE-370) INJECTION 76%
75.0000 mL | Freq: Once | INTRAVENOUS | Status: AC | PRN
  Administered 2018-11-13: 75 mL via INTRAVENOUS

## 2018-12-15 ENCOUNTER — Emergency Department (HOSPITAL_COMMUNITY): Payer: Medicare Other

## 2018-12-15 ENCOUNTER — Emergency Department (HOSPITAL_COMMUNITY)
Admission: EM | Admit: 2018-12-15 | Discharge: 2018-12-15 | Disposition: A | Discharge status: Home / Self Care | Payer: Medicare Other | Attending: Emergency Medicine | Admitting: Emergency Medicine

## 2018-12-15 ENCOUNTER — Other Ambulatory Visit: Payer: Self-pay

## 2018-12-15 DIAGNOSIS — I6523 Occlusion and stenosis of bilateral carotid arteries: Secondary | ICD-10-CM | POA: Diagnosis not present

## 2018-12-15 DIAGNOSIS — R413 Other amnesia: Secondary | ICD-10-CM | POA: Diagnosis not present

## 2018-12-15 DIAGNOSIS — I2510 Atherosclerotic heart disease of native coronary artery without angina pectoris: Secondary | ICD-10-CM | POA: Diagnosis not present

## 2018-12-15 DIAGNOSIS — I10 Essential (primary) hypertension: Secondary | ICD-10-CM | POA: Diagnosis not present

## 2018-12-15 DIAGNOSIS — Z79899 Other long term (current) drug therapy: Secondary | ICD-10-CM | POA: Insufficient documentation

## 2018-12-15 DIAGNOSIS — R569 Unspecified convulsions: Secondary | ICD-10-CM | POA: Diagnosis not present

## 2018-12-15 DIAGNOSIS — Z7982 Long term (current) use of aspirin: Secondary | ICD-10-CM | POA: Insufficient documentation

## 2018-12-15 DIAGNOSIS — R410 Disorientation, unspecified: Secondary | ICD-10-CM | POA: Diagnosis not present

## 2018-12-15 DIAGNOSIS — Z87891 Personal history of nicotine dependence: Secondary | ICD-10-CM | POA: Insufficient documentation

## 2018-12-15 LAB — DIFFERENTIAL
Abs Immature Granulocytes: 0.01 10*3/uL (ref 0.00–0.07)
BASOS PCT: 0 %
Basophils Absolute: 0 10*3/uL (ref 0.0–0.1)
EOS PCT: 3 %
Eosinophils Absolute: 0.2 10*3/uL (ref 0.0–0.5)
Immature Granulocytes: 0 %
Lymphocytes Relative: 22 %
Lymphs Abs: 1.4 10*3/uL (ref 0.7–4.0)
Monocytes Absolute: 0.4 10*3/uL (ref 0.1–1.0)
Monocytes Relative: 6 %
NEUTROS PCT: 69 %
Neutro Abs: 4.3 10*3/uL (ref 1.7–7.7)

## 2018-12-15 LAB — COMPREHENSIVE METABOLIC PANEL
ALT: 26 U/L (ref 0–44)
AST: 24 U/L (ref 15–41)
Albumin: 3.9 g/dL (ref 3.5–5.0)
Alkaline Phosphatase: 55 U/L (ref 38–126)
Anion gap: 8 (ref 5–15)
BUN: 18 mg/dL (ref 8–23)
CO2: 23 mmol/L (ref 22–32)
Calcium: 9.1 mg/dL (ref 8.9–10.3)
Chloride: 105 mmol/L (ref 98–111)
Creatinine, Ser: 1.03 mg/dL (ref 0.61–1.24)
GFR calc Af Amer: >60 mL/min (ref >60)
Glucose, Bld: 126 mg/dL — ABNORMAL HIGH (ref 70–99)
Potassium: 4.6 mmol/L (ref 3.5–5.1)
Sodium: 136 mmol/L (ref 135–145)
Total Bilirubin: 1.4 mg/dL — ABNORMAL HIGH (ref 0.3–1.2)
Total Protein: 6.6 g/dL (ref 6.5–8.1)

## 2018-12-15 LAB — CBC
HCT: 48.4 % (ref 39.0–52.0)
Hemoglobin: 15.7 g/dL (ref 13.0–17.0)
MCH: 30.4 pg (ref 26.0–34.0)
MCHC: 32.4 g/dL (ref 30.0–36.0)
MCV: 93.8 fL (ref 80.0–100.0)
PLATELETS: 174 10*3/uL (ref 150–400)
RBC: 5.16 MIL/uL (ref 4.22–5.81)
RDW: 11.9 % (ref 11.5–15.5)
WBC: 6.4 10*3/uL (ref 4.0–10.5)
nRBC: 0 % (ref 0.0–0.2)

## 2018-12-15 LAB — PROTIME-INR
INR: 1.06
PROTHROMBIN TIME: 13.7 s (ref 11.4–15.2)

## 2018-12-15 LAB — CBG MONITORING, ED
Glucose-Capillary: 123 mg/dL — ABNORMAL HIGH (ref 70–99)
Glucose-Capillary: 129 mg/dL — ABNORMAL HIGH (ref 70–99)

## 2018-12-15 LAB — APTT: aPTT: 27 seconds (ref 24–36)

## 2018-12-15 MED ORDER — IOPAMIDOL (ISOVUE-370) INJECTION 76%
100.0000 mL | Freq: Once | INTRAVENOUS | Status: AC | PRN
  Administered 2018-12-15: 100 mL via INTRAVENOUS

## 2018-12-15 MED ORDER — SODIUM CHLORIDE 0.9% FLUSH
3.0000 mL | Freq: Once | INTRAVENOUS | Status: AC
Start: 2018-12-15 — End: 2018-12-15
  Administered 2018-12-15: 3 mL via INTRAVENOUS

## 2018-12-15 MED ORDER — IOPAMIDOL (ISOVUE-370) INJECTION 76%
INTRAVENOUS | Status: AC
  Filled 2018-12-15: qty 100

## 2018-12-15 NOTE — ED Notes (Signed)
Patient verbalizes understanding of discharge instructions. Opportunity for questioning and answers were provided. Armband removed by staff, pt discharged from ED.  

## 2018-12-15 NOTE — Discharge Instructions (Addendum)
You were seen in the emergency department for acute memory loss and confusion.  You had blood work CAT scan and an MRI that did not show an obvious cause of your symptoms.  This is likely with occult total global amnesia usually will improve.  Will be important for you to follow-up with neurology for reevaluation.  Please return if any worsening symptoms.

## 2018-12-15 NOTE — ED Notes (Signed)
This nurse and neurology NP transported pt to MRI

## 2018-12-15 NOTE — ED Notes (Signed)
Kimberly([facility]/Activate Code Stroke)called @ 1313-per Asher Muir, Charge-called by Marylene Land

## 2018-12-15 NOTE — ED Triage Notes (Signed)
Arrival by POA.  Code stroke activated.  Onset 11am LSN, per wife pt states pt starting getting confused.  Recent memory loss.

## 2018-12-15 NOTE — ED Notes (Signed)
Patient transported to MRI with Albin Felling RN and Era Skeen

## 2018-12-15 NOTE — Consult Note (Addendum)
NEURO HOSPITALIST  CONSULT   Requesting Physician: Dr. Gustavus Messing    Chief Complaint: confusion  History obtained from:  Patient   John Mcmahon  HPI:                                                                                                                                         John Mcmahon is an 73 y.o. male with PMH HLD, ED, HTN ( white coat syndrome) presented to [facility] ED for c/o confusion. Code stroke was initiated in ED.    Per patient wife at about 10:00 am this morning patient was normal and they went upstairs to have sex.  They finished and both took showers. Then About 11:00am the patient became confused. He kept asking his wife the same questions. Had no idea of the month. No prior stroke history.  ED course:  CTH: no hemorrhage. BP: 136/86 BG:129    Date last known well: 12-15-2018 Time last known well: 1000 tPA Given: No: too mild to treat Modified Rankin: Rankin Score=0 NIHSS:1; missed month    Past Medical History:  Diagnosis Date  . Acute meniscal tear, lateral, left, initial encounter   . ED (erectile dysfunction)   . Elevated blood pressure reading in office with white coat syndrome, without diagnosis of hypertension   . Hyperlipidemia   . IFG (impaired fasting glucose)   . Insomnia   . Lumbago     Past Surgical History:  Procedure Laterality Date  . CATARACT EXTRACTION    . LEFT HEART CATH AND CORONARY ANGIOGRAPHY N/A 08/07/2017   Procedure: LEFT HEART CATH AND CORONARY ANGIOGRAPHY;  Surgeon: Nelva Bush, MD;  Location: [place] [facility];  Service: Cardiovascular;  Laterality: N/A;    Family History  Problem Relation Age of Onset  . Multiple myeloma Mother   . Cancer - Other Mother 35       BREAST  . Dementia Father   . Cancer - Other Maternal Grandmother   . Diabetes Maternal Grandmother   . Heart attack Maternal Grandfather 65  . Dementia Paternal Grandfather   . Healthy Son   . Healthy Son    . Healthy Daughter   . Heart attack Cousin       Social History:  reports that he quit smoking about 49 years ago. His smoking use included cigarettes. He has a 2.00 pack-year smoking history. He has never used smokeless tobacco. He reports current alcohol use of about 3.0 standard drinks of alcohol per week. He reports that he does not use drugs.  Allergies:  Allergies  Allergen Reactions  . Other Other (See Comments)  THIMRISOL  *PRESERVATIVE     Medications:                                                                                                                           Current Facility-Administered Medications  Medication Dose Route Frequency Provider Last Rate Last Dose  . sodium chloride flush (NS) 0.9 % injection 3 mL  3 mL Intravenous Once Tegeler, Gwenyth Allegra, MD       Current Outpatient Medications  Medication Sig Dispense Refill  . aspirin EC 81 MG tablet Take 81 mg by mouth daily.    . Evolocumab (REPATHA SURECLICK) 263 MG/ML SOAJ Inject 1 pen into the skin every 14 (fourteen) days. 6 pen 3  . Homeopathic Products (ANTACID MEDICINE PO) Take by mouth.    . isosorbide mononitrate (IMDUR) 30 MG 24 hr tablet Take 1 tablet (30 mg total) by mouth daily. 30 tablet 11  . isosorbide mononitrate (IMDUR) 30 MG 24 hr tablet TAKE 1 TABLET ONCE DAILY. 30 tablet 8  . Melatonin 5 MG CAPS Take 1 capsule by mouth at bedtime.    Marland Kitchen NAPROXEN DR PO Take 400 mg by mouth daily. Per pt takes 2-2107m tablets po daily.    . nitroGLYCERIN (NITROSTAT) 0.4 MG SL tablet Place 1 tablet (0.4 mg total) under the tongue every 5 (five) minutes as needed for chest pain. 100 tablet 3     ROS:                                                                                                                                       ROS was performed and is negative except as noted in HPI    General Examination:                                                                                                       Blood pressure 136/86, pulse 90, temperature 98.3 F (36.8 C), temperature source Oral, resp.  rate 15, SpO2 97 %.  HEENT-  Normocephalic, no lesions, without obvious abnormality.  Normal external eye and conjunctiva. Cardiovascular- S1-S2 audible, pulses palpable throughout  Lungs-no rhonchi or wheezing noted, no excessive working breathing.  Saturations within normal limits on RA Abdomen- All 4 quadrants palpated and non tender Extremities- Warm, dry and intact Musculoskeletal-no joint tenderness, deformity or swelling Skin-warm and dry, no hyperpigmentation, vitiligo, or suspicious lesions  Neurological Examination Mental Status: Alert, oriented age/name/year, does not know what month/ who the current president is, but can state the previous president. Does not know why he came to the hospital. thought content appropriate. Naming intact,  Speech fluent without evidence of aphasia.  Able to follow  commands without difficulty. Cranial Nerves: II: Visual fields grossly normal,  III,IV, VI: ptosis not present, extra-ocular motions intact bilaterally, pupils equal, round, reactive to light and accommodation V,VII: smile symmetric, facial light touch sensation normal bilaterally VIII: hearing normal bilaterally IX,X: uvula rises midline XI: bilateral shoulder shrug XII: midline tongue extension Motor: Right : Upper extremity   5/5  Left:     Upper extremity   5/5  Lower extremity   5/5   Lower extremity   5/5 Tone and bulk:normal tone throughout; no atrophy noted Sensory:  light touch intact throughout, bilaterally Deep Tendon Reflexes: 2+ and symmetric biceps and patella Plantars: Right: downgoing   Left: downgoing Cerebellar: normal finger-to-nose,  normal heel-to-shin test Gait: deferred   Lab Results:  CBC: Recent Labs  Lab 12/15/18 1316  WBC 6.4  NEUTROABS 4.3  HGB 15.7  HCT 48.4  MCV 93.8  PLT 174    CBG: Recent Labs  Lab 12/15/18 1305 12/15/18 1312   GLUCAP 123* 129*    Imaging: Ct Head Code Stroke Wo Contrast  Result Date: 12/15/2018 CLINICAL DATA:  Code stroke.  Seizure sudden onset of confusion. EXAM: CT HEAD WITHOUT CONTRAST TECHNIQUE: Contiguous axial images were obtained from the base of the skull through the vertex without intravenous contrast. COMPARISON:  None. FINDINGS: Brain: Mild age related atrophy. Mild chronic small-vessel ischemic change of the white matter. No sign of acute infarction, mass lesion, hemorrhage, hydrocephalus or extra-axial collection. Vascular: There is atherosclerotic calcification of the major vessels at the base of the brain. Skull: Negative Sinuses/Orbits: Clear/normal Other: None ASPECTS ([place] Stroke Program Early CT Score) - Ganglionic level infarction (caudate, lentiform nuclei, internal capsule, insula, M1-M3 cortex): 7 - Supraganglionic infarction (M4-M6 cortex): 3 Total score (0-10 with 10 being normal): 10 IMPRESSION: 1. No acute finding. Age related atrophy with mild chronic small-vessel ischemic change of the white matter. 2. ASPECTS is 10. 3. These results were communicated to at 1:28 pmon 2/15/2020by text page via the [facility] messaging system. Electronically Signed   By: Nelson Chimes M.D.   On: 12/15/2018 13:29    Laurey Morale, MSN, NP-C [facility] [phone]   12/15/2018, 1:41 PM   Attending physician note to follow with Assessment and plan .   Assessment: 73 y.o. male with PMH HLD, ED, HTN ( whoite coat syndrome) presented to [facility] ED for c/o confusion. Code stroke was initiated in ED. CTH: no hemorrhage. Taken for STAT MRI: pending . Given that patient can remember past facts, but not the present it is most likely TGA, but will pursue MRI and TIA work-up.  Stroke Risk Factors - hyperlipidemia  Impression: Stroke vs TGA  Recommendations: -- BP goal : Permissive HTN upto 220/110 mmHg  --MRI Brain STAT : Negative for acute stroke --CTA head  and neck    --please page  stroke NP  Or  PA  Or MD from 8am -4 pm  as this patient from this time will be  followed by the stroke.   You can look them up on [email or web address]  Password [email or web address]  NEUROHOSPITALIST ADDENDUM Performed a face to face diagnostic evaluation.   I have reviewed the contents of history and physical exam as documented by PA/ARNP/Resident and agree with above documentation.  I have discussed and formulated the above plan as documented. Edits to the note have been made as needed.  Patient presents to the emergency room with sudden onset recent memory loss confusion after intercourse with his wife.  His wife states that he was fine and 11:00 what the date was, asking the same questions team disoriented to time.  He was stroke alerted in the emergency room.  NIH stroke scale was 0, no language deficits noted.  However patient could not remember month, current president or any other recent events.  Stat MRI was obtained which was negative for any infarct.  Patient gradually improved during his stay in the emergency room is back to his baseline.  CTA head neck negative for any high-grade stenosis.  She likely had transient global amnesia.  He can follow-up with neurology as outpatient.  No further neurological work-up needed as inpatient.   Karena Addison Wilburn Keir MD [facility] [phone]   If 7pm to 7am, please call on call as listed on AMION.

## 2018-12-15 NOTE — ED Notes (Signed)
Patient evaluated at bridge by MD for airway clearance. Immediately to CT

## 2018-12-15 NOTE — ED Notes (Signed)
Patient transported to CT 

## 2018-12-15 NOTE — ED Notes (Signed)
Pt back from MRI 

## 2018-12-15 NOTE — ED Provider Notes (Signed)
[facility] EMERGENCY DEPARTMENT Provider Note   CSN: [id number] Arrival date & time: 12/15/18  1300     History   Chief Complaint Chief Complaint  Patient presents with  . Code Stroke    HPI John Mcmahon is a 73 y.o. male.  The history is provided by the patient, the spouse and medical records.  Neurologic Problem  This is a new problem. The current episode started 1 to 2 hours ago. The problem occurs constantly. The problem has not changed since onset.Pertinent negatives include no chest pain, no abdominal pain, no headaches and no shortness of breath. Nothing aggravates the symptoms. Nothing relieves the symptoms. He has tried nothing for the symptoms. The treatment provided no relief.    Past Medical History:  Diagnosis Date  . Acute meniscal tear, lateral, left, initial encounter   . ED (erectile dysfunction)   . Elevated blood pressure reading in office with white coat syndrome, without diagnosis of hypertension   . Hyperlipidemia   . IFG (impaired fasting glucose)   . Insomnia   . Lumbago     Patient Active Problem List   Diagnosis Date Noted  . Coronary artery disease of native artery of native heart with stable angina pectoris ([place]) 08/04/2017  . Hyperlipidemia LDL goal <70 08/04/2017  . Coronary artery calcification 08/04/2017    Past Surgical History:  Procedure Laterality Date  . CATARACT EXTRACTION    . LEFT HEART CATH AND CORONARY ANGIOGRAPHY N/A 08/07/2017   Procedure: LEFT HEART CATH AND CORONARY ANGIOGRAPHY;  Surgeon: Nelva Bush, MD;  Location: [facility];  Service: Cardiovascular;  Laterality: N/A;        Home Medications    Prior to Admission medications   Medication Sig Start Date End Date Taking? Authorizing Provider  aspirin EC 81 MG tablet Take 81 mg by mouth daily.    [provider]  Evolocumab (REPATHA SURECLICK) 505 MG/ML SOAJ Inject 1 pen into the skin every 14 (fourteen) days. 10/01/18    End, Harrell Gave, MD  Homeopathic Products (ANTACID MEDICINE PO) Take by mouth.    [provider]  isosorbide mononitrate (IMDUR) 30 MG 24 hr tablet Take 1 tablet (30 mg total) by mouth daily. 08/07/17 08/07/18  End, Harrell Gave, MD  isosorbide mononitrate (IMDUR) 30 MG 24 hr tablet TAKE 1 TABLET ONCE DAILY. 08/13/18   End, Harrell Gave, MD  Melatonin 5 MG CAPS Take 1 capsule by mouth at bedtime.    [provider]  NAPROXEN DR PO Take 400 mg by mouth daily. Per pt takes 2-228m tablets po daily.    [provider]  nitroGLYCERIN (NITROSTAT) 0.4 MG SL tablet Place 1 tablet (0.4 mg total) under the tongue every 5 (five) minutes as needed for chest pain. 08/04/17   End, CHarrell Gave MD    Family History Family History  Problem Relation Age of Onset  . Multiple myeloma Mother   . Cancer - Other Mother 424      BREAST  . Dementia Father   . Cancer - Other Maternal Grandmother   . Diabetes Maternal Grandmother   . Heart attack Maternal Grandfather 65  . Dementia Paternal Grandfather   . Healthy Son   . Healthy Son   . Healthy Daughter   . Heart attack Cousin     Social History Social History   Tobacco Use  . Smoking status: Former Smoker    Packs/day: 1.00    Years: 2.00    Pack years:  2.00    Types: Cigarettes    Last attempt to quit: 08/03/1969    Years since quitting: 49.4  . Smokeless tobacco: Never Used  Substance Use Topics  . Alcohol use: Yes    Alcohol/week: 3.0 standard drinks    Types: 3 Glasses of wine per week  . Drug use: No     Allergies   Other   Review of Systems Review of Systems  Constitutional: Negative for activity change, chills, diaphoresis, fatigue and fever.  HENT: Negative for congestion and rhinorrhea.   Eyes: Negative for visual disturbance.  Respiratory: Negative for cough, chest tightness, shortness of breath, wheezing and stridor.   Cardiovascular: Negative for chest pain, palpitations and leg swelling.    Gastrointestinal: Negative for abdominal distention, abdominal pain, blood in stool, constipation, diarrhea, nausea and vomiting.  Genitourinary: Negative for difficulty urinating, dysuria and flank pain.  Musculoskeletal: Negative for back pain, gait problem and neck pain.  Skin: Negative for rash and wound.  Neurological: Negative for dizziness, seizures, speech difficulty, weakness, light-headedness and headaches.  Psychiatric/Behavioral: Positive for confusion. Negative for agitation.  All other systems reviewed and are negative.    Physical Exam Updated Vital Signs BP (!) 132/97   Pulse 87   Temp 98.3 F (36.8 C) (Oral)   Resp 12   Wt 77.9 kg   SpO2 94%   BMI 24.64 kg/m   Physical Exam Vitals signs and nursing note reviewed.  Constitutional:      General: He is not in acute distress.    Appearance: He is well-developed. He is not ill-appearing, toxic-appearing or diaphoretic.  HENT:     Head: Normocephalic and atraumatic.     Nose: No congestion or rhinorrhea.     Mouth/Throat:     Pharynx: No oropharyngeal exudate or posterior oropharyngeal erythema.  Eyes:     General: No visual field deficit.    Extraocular Movements: Extraocular movements intact.     Conjunctiva/sclera: Conjunctivae normal.     Pupils: Pupils are equal, round, and reactive to light.  Neck:     Musculoskeletal: Neck supple. No muscular tenderness.  Cardiovascular:     Rate and Rhythm: Normal rate and regular rhythm.     Pulses: Normal pulses.     Heart sounds: No murmur.  Pulmonary:     Effort: Pulmonary effort is normal. No respiratory distress.     Breath sounds: Normal breath sounds. No wheezing, rhonchi or rales.  Chest:     Chest wall: No tenderness.  Abdominal:     Palpations: Abdomen is soft.     Tenderness: There is no abdominal tenderness. There is no right CVA tenderness or left CVA tenderness.  Musculoskeletal:        General: No tenderness.  Skin:    General: Skin is warm  and dry.     Findings: No erythema or rash.  Neurological:     Mental Status: He is alert. He is confused.     Cranial Nerves: No cranial nerve deficit, dysarthria or facial asymmetry.     Sensory: No sensory deficit.     Motor: No weakness.     Coordination: Coordination normal. Finger-Nose-Finger Test normal.  Psychiatric:        Cognition and Memory: Memory is impaired. He exhibits impaired recent memory and impaired remote memory.      ED Treatments / Results  Labs (all labs ordered are listed, but only abnormal results are displayed) Labs Reviewed  COMPREHENSIVE METABOLIC PANEL - Abnormal;  Notable for the following components:      Result Value   Glucose, Bld 126 (*)    Total Bilirubin 1.4 (*)    All other components within normal limits  CBG MONITORING, ED - Abnormal; Notable for the following components:   Glucose-Capillary 123 (*)    All other components within normal limits  CBG MONITORING, ED - Abnormal; Notable for the following components:   Glucose-Capillary 129 (*)    All other components within normal limits  PROTIME-INR  APTT  CBC  DIFFERENTIAL    EKG EKG Interpretation  Date/Time:  Saturday December 15 2018 13:30:49 EST Ventricular Rate:  91 PR Interval:    QRS Duration: 87 QT Interval:  345 QTC Calculation: 425 R Axis:   -6 Text Interpretation:  Sinus thyrhm Baseline wander in lead(s) V1 When compared to prior, no significant changes seen.  No STEMI Confirmed by Antony Blackbird [phone]) on 12/15/2018 4:38:13 PM   Radiology Mr Brain Wo Contrast  Result Date: 12/15/2018 CLINICAL DATA:  Confusion. EXAM: MRI HEAD WITHOUT CONTRAST TECHNIQUE: Multiplanar, multiecho pulse sequences of the brain and surrounding structures were obtained without intravenous contrast. COMPARISON:  Head CT 12/15/2017 FINDINGS: Brain: There is no evidence of acute infarct, intracranial hemorrhage, mass, midline shift, or extra-axial fluid collection. Mild cerebral atrophy and  several scattered punctate foci of cerebral white matter T2 hyperintensity are all within normal limits for age. 2.5 x 1 cm asymmetric extra-axial CSF space anteromedially in the left middle cranial fossa is compatible with an incidental arachnoid cyst, of no current clinical significance. Vascular: Major intracranial vascular flow voids are preserved. Skull and upper cervical spine: Unremarkable bone marrow signal. Sinuses/Orbits: Bilateral cataract extraction. Paranasal sinuses and mastoid air cells are clear. Other: None. IMPRESSION: No acute or significant intracranial findings. Electronically Signed   By: Logan Bores M.D.   On: 12/15/2018 14:10   Ct Head Code Stroke Wo Contrast  Result Date: 12/15/2018 CLINICAL DATA:  Code stroke.  Seizure sudden onset of confusion. EXAM: CT HEAD WITHOUT CONTRAST TECHNIQUE: Contiguous axial images were obtained from the base of the skull through the vertex without intravenous contrast. COMPARISON:  None. FINDINGS: Brain: Mild age related atrophy. Mild chronic small-vessel ischemic change of the white matter. No sign of acute infarction, mass lesion, hemorrhage, hydrocephalus or extra-axial collection. Vascular: There is atherosclerotic calcification of the major vessels at the base of the brain. Skull: Negative Sinuses/Orbits: Clear/normal Other: None ASPECTS ([place] Stroke Program Early CT Score) - Ganglionic level infarction (caudate, lentiform nuclei, internal capsule, insula, M1-M3 cortex): 7 - Supraganglionic infarction (M4-M6 cortex): 3 Total score (0-10 with 10 being normal): 10 IMPRESSION: 1. No acute finding. Age related atrophy with mild chronic small-vessel ischemic change of the white matter. 2. ASPECTS is 10. 3. These results were communicated to at 1:28 pmon 2/15/2020by text page via the [facility] messaging system. Electronically Signed   By: Nelson Chimes M.D.   On: 12/15/2018 13:29    Procedures Procedures (including critical care time)  Medications  Ordered in ED Medications  sodium chloride flush (NS) 0.9 % injection 3 mL (3 mLs Intravenous Given 12/15/18 1338)     Initial Impression / Assessment and Plan / ED Course  I have reviewed the triage vital signs and the nursing notes.  Pertinent labs & imaging results that were available during my care of the patient were reviewed by me and considered in my medical decision making (see chart for details).  Clinical Course as of Dec 16 1947  Sat Dec 15, 2018  1702 I went spoke to the patient and his wife.  She seems to feel he is almost back to normal.  He still cannot remember what happened around 10 AM when his symptoms started but everything else has been fine.  Will review with neurologist Dr. Lorraine Lax.   [MB]  [phone] Discussed with Dr. Lorraine Lax who asked if he can get a CTA of his head and neck and as long as that looks okay he can follow-up outpatient with neurology for EEG and further testing.  Patient and his wife are comfortable with this plan.   [MB]    Clinical Course User Index [MB] Hayden Rasmussen, MD    John Mcmahon is a 73 y.o. male past medical history significant for hyperlipidemia, coronary artery disease, and ascending thoracic aortic aneurysm who presents for acute confusion and memory problems.  Patient was activated as a code stroke in triage.  Patient is coming by wife who reports that they had intercourse at 10 AM.  At 11 AM, patient began acting confused and not like his normal self.  He was repetitive in questioning and does not remember recent travel.  He does not remember anything going on today.  This is abnormal for him.  They called the PCP who told him to come to the emergency department immediately.  Patient denies any headache, neck pain, neck stiffness.  No history of stroke or TIA.  Patient denies any numbness, tingling, weakness of extremities.  He denies any speech difficulties or vision changes.  No nausea or vomiting.  No recent trauma.  No other  complaints on arrival aside from the memory loss and confusion.  Wife did not report any other changes.  On my exam, airway was clear for imaging.  Patient had clear speech.  No dysarthria or aphasia.  Pupils were intact and had normal extraocular movements.  No facial droop.  Normal sensation in extremities and face.  Normal finger-nose-finger testing bilaterally.  Normal strength in extremities.  Lungs clear chest nontender.  Abdomen nontender.  Back nontender, no nuchal rigidity or neck stiffness.  Clinically I suspect stroke versus transient global amnesia.  We will continue with a code stroke as wife reports he is still not his normal self with confusion and repetitive questioning.  Anticipate following up on neurology recommendations.  Neurology felt the patient did not have a stroke.  They obtained a MRI which showed no stroke.  They suspect patient had transient global amnesia which was the primary concern upon arrival.  However, patient is not back to his baseline currently.  Family still reports he is acting slightly confused and does not have normal memory.  Neurology reports that if the patient is not back to his baseline in the next several hours, he will need to be brought in overnight for further monitoring and if symptoms worsen, may need EEG for atypical seizure causing his symptoms.  I assessed patient several months ago and he would like to wait another hour to see if his memory continues to improve.  If patient feels he is back to baseline, he will be stable for discharge home.  Memory is still not at his baseline, he needs to be admitted for further monitoring.  Care transferred to Dr. Melina Copa while awaiting reassessment of patient's mental status.  At 5:00, patient is still not at his baseline, he will need admission for further monitoring of likely transient global amnesia.  Final Clinical Impressions(s) / ED Diagnoses  Final diagnoses:  Memory loss    ED Discharge  Orders    None     Clinical Impression: 1. Memory loss     Disposition: Care transferred to Dr. Melina Copa while awaiting reassessment for her memory and mental status.  Anticipate admission if he is not back to baseline per neurology recommendations.  This note was prepared with assistance of Systems analyst. Occasional wrong-word or sound-a-like substitutions may have occurred due to the inherent limitations of voice recognition software.      , Gwenyth Allegra, MD 12/15/18 2004

## 2018-12-15 NOTE — ED Provider Notes (Signed)
Signout from Dr. Rush Landmark.  73 year old male with acute onset of amnesia confusion.  CT and MRI are negative. Physical Exam  BP 118/79   Pulse 77   Temp 98.3 F (36.8 C) (Oral)   Resp 17   Wt 77.9 kg   SpO2 94%   BMI 24.64 kg/m   Physical Exam  ED Course/Procedures   Clinical Course as of Dec 16 1116  Sat Dec 15, 2018  1702 I went spoke to the patient and his wife.  She seems to feel he is almost back to normal.  He still cannot remember what happened around 10 AM when his symptoms started but everything else has been fine.  Will review with neurologist Dr. Laurence Slate.   [MB]  [phone] Discussed with Dr. Laurence Slate who asked if he can get a CTA of his head and neck and as long as that looks okay he can follow-up outpatient with neurology for EEG and further testing.  Patient and his wife are comfortable with this plan.   [MB]    Clinical Course User Index [MB] Terrilee Files, MD    Procedures  MDM  Patient is slowly returning to baseline.  Plan is to reevaluate at 5 PM and if he is improved enough he can be discharged otherwise may need a medical admission.  Neurology is following.       Terrilee Files, MD 12/16/18 1118

## 2018-12-28 ENCOUNTER — Ambulatory Visit: Payer: Medicare Other | Admitting: Neurology

## 2018-12-28 ENCOUNTER — Encounter: Payer: Self-pay | Admitting: Neurology

## 2018-12-28 VITALS — BP 108/68 | HR 100 | Ht 70.0 in | Wt 176.0 lb

## 2018-12-28 DIAGNOSIS — G454 Transient global amnesia: Secondary | ICD-10-CM | POA: Diagnosis not present

## 2018-12-28 DIAGNOSIS — R419 Unspecified symptoms and signs involving cognitive functions and awareness: Secondary | ICD-10-CM | POA: Diagnosis not present

## 2018-12-28 NOTE — Progress Notes (Signed)
[facility]    Provider:  Dr Jaynee Eagles Referring Provider: Aletta Edouard MD Primary Care Provider:  Marton Redwood, MD  CC:  Transient Global Amnesia  HPI:  John Mcmahon is a lovely 73 y.o. male here as requested by [facility] for Transient Global Amnesia.  Past medical history of erectile dysfunction, elevated blood pressure reading in the office with white coat syndrome without diagnosis of hypertension, hyperlipidemia, impaired fasting glucose, insomnia, lumbago, coronary artery disease. Here with his equally lovely wife who also provides information. They had a busy week, it was Feb 15th, they had breakfast and sex. At 11am he asked what time it was over and over again. He was responding but kept asking the same questions over and over again. He said he felt confused, wife knew he really was not kidding, he couldn;t remember what day it was. He never recalled the 4 hours. He was very cooperative. They went to the ED and a code stroke was called. He started slowly remembering things and resolved in the ED. He has not had anymore incidents. He never recovered the 4 hours. No history of seizures. No other focal neurologic deficits, associated symptoms, inciting events or modifiable factors.  Reviewed notes, labs and imaging from outside physicians, which showed:   Reviewed emergency room notes.  Patient was seen in the emergency room December 15, 2018.  A code stroke was called.  He denied chest pain, abdominal pain, headaches also shortness of breath.He is a former smoker of cigarettes.  Examination was normal except for confusion, impaired memory, impaired recent and remote memory.  At about 10:00 in the morning patient was normal and they went upstairs to have sex.  They finished and both took showers.  Then about 11 AM the patient became confused.  He kept asking his wife the same questions.  Had no idea of the month.  No prior stroke history.  Glucose and blood pressure  were unremarkable.  CT of the head showed no hemorrhage nothing acute.  MRI of the brain was negative for acute stroke.  CTA of the head and neck were also unremarkable.  MRI of the brain: personally reviewed images and agree Brain: There is no evidence of acute infarct, intracranial hemorrhage, mass, midline shift, or extra-axial fluid collection. Mild cerebral atrophy and several scattered punctate foci of cerebral white matter T2 hyperintensity are all within normal limits for age. 2.5 x 1 cm asymmetric extra-axial CSF space anteromedially in the left middle cranial fossa is compatible with an incidental arachnoid cyst, of no current clinical significance.  Review of Systems: Patient complains of symptoms per HPI as well as the following symptoms: confusion. Pertinent negatives and positives per HPI. All others negative.   Social History   Socioeconomic History  . Marital status: Married    Spouse name: Not on file  . Number of children: 3  . Years of education: Not on file  . Highest education level: Professional school degree (e.g., MD, DDS, DVM, JD)  Occupational History  . Not on file  Social Needs  . Financial resource strain: Not on file  . Food insecurity:    Worry: Not on file    Inability: Not on file  . Transportation needs:    Medical: Not on file    Non-medical: Not on file  Tobacco Use  . Smoking status: Former Smoker    Packs/day: 1.00    Years: 2.00    Pack years: 2.00    Types: Cigarettes  Last attempt to quit: 08/03/1969    Years since quitting: 49.4  . Smokeless tobacco: Never Used  Substance and Sexual Activity  . Alcohol use: Yes    Alcohol/week: 7.0 standard drinks    Types: 7 Standard drinks or equivalent per week  . Drug use: No  . Sexual activity: Not on file  Lifestyle  . Physical activity:    Days per week: Not on file    Minutes per session: Not on file  . Stress: Not on file  Relationships  . Social connections:    Talks on phone:  Not on file    Gets together: Not on file    Attends religious service: Not on file    Active member of club or organization: Not on file    Attends meetings of clubs or organizations: Not on file    Relationship status: Not on file  . Intimate partner violence:    Fear of current or ex partner: Not on file    Emotionally abused: Not on file    Physically abused: Not on file    Forced sexual activity: Not on file  Other Topics Concern  . Not on file  Social History Narrative   Lives at home with spouse    Right handed   Caffeine: 1 cup tea daily    Family History  Problem Relation Age of Onset  . Multiple myeloma Mother   . Cancer - Other Mother 49       BREAST  . Other Mother        sepsis  . Dementia Father   . Cancer Father        neck   . Cancer - Other Maternal Grandmother   . Diabetes Maternal Grandmother   . Heart attack Maternal Grandfather 65  . Dementia Paternal Grandfather   . Healthy Son   . Healthy Son   . Healthy Daughter   . Heart attack Cousin   . Cancer Paternal Grandmother     Past Medical History:  Diagnosis Date  . Acute meniscal tear, lateral, left, initial encounter   . ED (erectile dysfunction)   . Elevated blood pressure reading in office with white coat syndrome, without diagnosis of hypertension   . Heart disease   . Hyperlipidemia   . IFG (impaired fasting glucose)   . Insomnia   . Lumbago     Patient Active Problem List   Diagnosis Date Noted  . Coronary artery disease of native artery of native heart with stable angina pectoris ([place]) 08/04/2017  . Hyperlipidemia LDL goal <70 08/04/2017  . Coronary artery calcification 08/04/2017    Past Surgical History:  Procedure Laterality Date  . CATARACT EXTRACTION    . COLONOSCOPY    . LEFT HEART CATH AND CORONARY ANGIOGRAPHY N/A 08/07/2017   Procedure: LEFT HEART CATH AND CORONARY ANGIOGRAPHY;  Surgeon: Nelva Bush, MD;  Location: [facility];  Service: Cardiovascular;   Laterality: N/A;    Current Outpatient Medications  Medication Sig Dispense Refill  . aspirin EC 81 MG tablet Take 81 mg by mouth daily.    . Evolocumab (REPATHA SURECLICK) 161 MG/ML SOAJ Inject 1 pen into the skin every 14 (fourteen) days. 6 pen 3  . Ibuprofen-diphenhydrAMINE Cit (ADVIL PM) 200-38 MG TABS Take 2 tablets by mouth at bedtime.    . Melatonin 5 MG CAPS Take 2 capsules by mouth at bedtime.     . Multiple Vitamin (MULTIVITAMIN WITH MINERALS) TABS tablet Take 1  tablet by mouth daily.    . nitroGLYCERIN (NITROSTAT) 0.4 MG SL tablet Place 1 tablet (0.4 mg total) under the tongue every 5 (five) minutes as needed for chest pain. 100 tablet 3  . isosorbide mononitrate (IMDUR) 30 MG 24 hr tablet Take 1 tablet (30 mg total) by mouth daily. 30 tablet 11   No current facility-administered medications for this visit.     Allergies as of 12/28/2018 - Review Complete 12/28/2018  Allergen Reaction Noted  . Other Other (See Comments) 08/10/2011    Vitals: BP 108/68 (BP Location: Right Arm, Patient Position: Sitting)   Pulse 100   Ht _0  (1.778 m)   Wt 176 lb (79.8 kg)   BMI 25.25 kg/m  Last Weight:  Wt Readings from Last 1 Encounters:  12/28/18 176 lb (79.8 kg)   Last Height:   Ht Readings from Last 1 Encounters:  12/28/18 _1  (1.778 m)     Physical exam: Exam: Gen: NAD, conversant, well nourised, well groomed                     CV: RRR, no MRG. No Carotid Bruits. No peripheral edema, warm, nontender Eyes: Conjunctivae clear without exudates or hemorrhage  Neuro: Detailed Neurologic Exam  Speech:    Speech is normal; fluent and spontaneous with normal comprehension.  Cognition:    The patient is oriented to person, place, and time;     recent and remote memory intact;     language fluent;     normal attention, concentration,     fund of knowledge Cranial Nerves:    The pupils are equal, round, and reactive to light. The fundi are normal and spontaneous  venous pulsations are present. Visual fields are full to finger confrontation. Extraocular movements are intact. Trigeminal sensation is intact and the muscles of mastication are normal. The face is symmetric. The palate elevates in the midline. Hearing intact. Voice is normal. Shoulder shrug is normal. The tongue has normal motion without fasciculations.   Coordination:    Normal finger to nose and heel to shin.  Gait:    Heel-toe and tandem gait are normal.   Motor Observation:    No asymmetry, no atrophy, and no involuntary movements noted. Tone:    Normal muscle tone.    Posture:    Posture is normal. normal erect    Strength:    Strength is V/V in the upper and lower limbs.      Sensation: intact to LT     Reflex Exam:  DTR's:    Deep tendon reflexes in the upper and lower extremities are normal bilaterally.   Toes:    The toes are downgoing bilaterally.   Clonus:    Clonus is absent.    Assessment/Plan:  Lovely 54 year old here with wife to follow up after a classic episode of TGA. CT head, MRI brain, CTA of the head and neck unremarkable.  Orders Placed This Encounter  Procedures  . EEG     Cc: Marton Redwood, MD,    Sarina Ill, MD  [facility] [address] [place], [place] [address]  Phone [phone] Fax [phone]

## 2018-12-28 NOTE — Patient Instructions (Addendum)
EEG Follow up as needed   Transient Global Amnesia Transient global amnesia causes a sudden and temporary (transient) loss of memory (amnesia). You may recall memories from your distant past and people you know well. However, you may not recall things that happened more recently in the past days, months, or even year. A transient global amnesia episode does not last longer than 24 hours. Transient global amnesia does not affect your other brain functions. Your memory usually returns to normal after an episode is over. One episode of transient global amnesia does not make you more likely to have a stroke, a relapse, or other complications. What are the causes? The cause of this condition is not known. Certain activities have been reported to trigger transient global amnesia. These activities include:  Swimming in very cold or hot water.  Sexual intercourse.  Emotional distress, such as receiving bad news or having a lot of stress at once.  Strenuous exercise or activity. What increases the risk? You are more likely to develop this condition if:  You are 41-4 years old.  You have a history of migraine headaches. What are the signs or symptoms? The main symptoms of this condition include:  Being unable to remember recent events.  Asking repetitive questions about a situation and surroundings and not recalling the answers to these questions. Other symptoms include:  Restlessness and nervousness.  Confusion.  Headaches.  Dizziness.  Nausea. How is this diagnosed? This condition may be diagnosed based on:  Your symptoms.  A physical exam.  A test to check your mental abilities (cognitive evaluation).  Imaging studies to check brain function. These may include: ? Electroencephalogram (EEG). This test checks the brain's electrical activity. ? CT scan. ? MRI. How is this treated? There is no treatment for this condition. An episode typically goes away on its own after a  few hours. You may receive medicines to treat other conditions, such as a migraine. Follow these instructions at home:  Take over-the-counter and prescription medicines only as told by your health care provider.  Avoid taking medicines that can affect thinking, such as pain or sleeping medicines.  Learn what activities may trigger an episode. Avoid these activities as told by your health care provider.  Find ways to manage stress, such as meditation or yoga.  Keep all follow-up visits as told by your health care provider. This is important. Contact a health care provider if you:  Have a migraine that does not go away.  Experience transient global amnesia repeatedly. Get help right away if you:  Have a seizure. Summary  Transient global amnesia causes a sudden and temporary (transient) loss of memory (amnesia).  There is no treatment for this condition. An episode typically goes away on its own after a few hours.  You may receive medicines to treat other conditions, such as a migraine.  Transient global amnesia does not affect your other brain functions. Your memory usually returns to normal after an episode is over. This information is not intended to replace advice given to you by your health care provider. Make sure you discuss any questions you have with your health care provider. Document Released: 11/24/2004 Document Revised: 11/01/2017 Document Reviewed: 11/01/2017 Elsevier Interactive Patient Education  2019 [employer].

## 2019-02-11 ENCOUNTER — Telehealth: Payer: Self-pay

## 2019-02-11 NOTE — Telephone Encounter (Signed)
John Mcmahon (Key: [id number])  Repatha SureClick 140MG /ML auto-injectors  Message from Plan Effective from 02/05/2019 through 02/05/2020.

## 2019-02-12 ENCOUNTER — Telehealth: Payer: Self-pay | Admitting: *Deleted

## 2019-02-12 NOTE — Telephone Encounter (Signed)
Patient do for 1 year follow up in July. Decided to call and go ahead and offer evisit. Patient states he is doing well and would rather wait until closer to July.

## 2019-02-13 ENCOUNTER — Other Ambulatory Visit: Payer: Self-pay

## 2019-02-18 DIAGNOSIS — L570 Actinic keratosis: Secondary | ICD-10-CM | POA: Diagnosis not present

## 2019-02-18 DIAGNOSIS — L812 Freckles: Secondary | ICD-10-CM | POA: Diagnosis not present

## 2019-02-18 DIAGNOSIS — L821 Other seborrheic keratosis: Secondary | ICD-10-CM | POA: Diagnosis not present

## 2019-02-18 DIAGNOSIS — D2262 Melanocytic nevi of left upper limb, including shoulder: Secondary | ICD-10-CM | POA: Diagnosis not present

## 2019-03-12 ENCOUNTER — Other Ambulatory Visit: Payer: Self-pay

## 2019-03-12 MED ORDER — EVOLOCUMAB 140 MG/ML ~~LOC~~ SOAJ: 1.0000 "pen " | SUBCUTANEOUS | 0 refills | Status: DC

## 2019-03-20 ENCOUNTER — Ambulatory Visit (INDEPENDENT_AMBULATORY_CARE_PROVIDER_SITE_OTHER): Payer: Medicare Other | Admitting: Neurology

## 2019-03-20 ENCOUNTER — Other Ambulatory Visit: Payer: Self-pay

## 2019-03-20 DIAGNOSIS — G454 Transient global amnesia: Secondary | ICD-10-CM

## 2019-03-20 DIAGNOSIS — R4182 Altered mental status, unspecified: Secondary | ICD-10-CM

## 2019-03-20 DIAGNOSIS — R419 Unspecified symptoms and signs involving cognitive functions and awareness: Secondary | ICD-10-CM

## 2019-03-27 NOTE — Procedures (Signed)
    History:  John Mcmahon is a 73 year old gentleman with a history of transient global amnesia.  The event occurred on 15 December 2018.  The patient has not had any recurrence of this episode.  He is being evaluated for the amnestic period.  This is a routine EEG.  No skull defects are noted.  Medications include aspirin, Repatha, melatonin, multivitamins, and Imdur.  EEG classification: Normal awake  Description of the recording: The background rhythms of this recording consists of a fairly well modulated medium amplitude alpha rhythm of 8 Hz that is reactive to eye opening and closure. As the record progresses, the patient appears to remain in the waking state throughout the recording. Photic stimulation was performed, resulting in a bilateral and symmetric photic driving response. Hyperventilation was also performed, resulting in a minimal buildup of the background rhythm activities without significant slowing seen. At no time during the recording does there appear to be evidence of spike or spike wave discharges or evidence of focal slowing. EKG monitor was not available during the recording.  Impression: This is a normal EEG recording in the waking state. No evidence of ictal or interictal discharges are seen.

## 2019-03-28 ENCOUNTER — Telehealth: Payer: Self-pay | Admitting: Neurology

## 2019-03-28 NOTE — Telephone Encounter (Signed)
-----   Message from Anson Fret, MD sent at 03/27/2019 10:00 PM EDT ----- EEG is normal thanks

## 2019-03-28 NOTE — Telephone Encounter (Signed)
Called the patient to inform him of the normal EEG results. Pt verbalized understanding. Pt had no questions at this time but was encouraged to call back if questions arise.

## 2019-05-15 ENCOUNTER — Other Ambulatory Visit: Payer: Self-pay | Admitting: Internal Medicine

## 2019-06-10 NOTE — Progress Notes (Signed)
Follow-up Outpatient Visit Date: 06/12/2019  Primary Care Provider: Marton Redwood, MD [place] [address]  Chief Complaint: Follow-up coronary artery disease and hyperlipidemia  HPI:  [name] is a 73 y.o. year-old male with history of  moderate multivessel coronary artery disease, hyperlipidemia, and imparied fasting glucose, who presents for follow-up of coronary artery disease.  I last saw him in 04/2018, at which time [name] was feeling well.  He was tolerating isosorbide mononitrate and Repatha well.  Today, [name] reports that he continues to feel well.  He has not had any chest pain, shortness of breath, palpitations, edema, or orthopnea.  He notes some fatigue when working outside in high heat and humidity, though overall, he feels the same compared with 3 years ago.  He continues to walk 3 miles a day at a fast pace without any symptoms.  He is tolerating his current medications well, though he inquires about discontinuing isosorbide mononitrate so that he can use a PDE 5 inhibitor for treatment of his erectile dysfunction.  [name] notes some occasional orthostatic lightheadedness, particularly when standing up quickly after kneeling.  Lightheadedness typically lasts 5 seconds or less.  He has not fallen or passed out.  --------------------------------------------------------------------------------------------------  Cardiovascular History & Procedures: Cardiovascular Problems:  Mild to moderate coronary artery disease  Risk Factors:  Known coronary artery disease, hyperlipidemia, male gender, age greater than 67  Cath/PCI:  LHC (08/07/17): LMCA normal. LAD with 50% midvessel stenosis. Codominant LCx with 40% lPDA lesion. 50% proximal and 25% mid RCA lesions. LVEF 55-65%. LVEDP 10 mmHg.  CV Surgery:  None  EP Procedures and Devices:  None  Non-Invasive Evaluation(s):  Coronary calcium score (06/29/17): Calcium score of 952 with  extensive calcifications in the LAD him a as well as less prominent calcification involving the LMCA, LCx, and RCA. Slight aneurysmal dilation of ascending aortic, measuring 4.0 cm.18 mmleft lower lobe nodule.  Recent CV Pertinent Labs: Lab Results  Component Value Date   CHOL 101 12/20/2017   HDL 43 12/20/2017   LDLCALC 43 12/20/2017   TRIG 74 12/20/2017   CHOLHDL 2.3 12/20/2017   INR 1.06 12/15/2018   K 4.6 12/15/2018   BUN 18 12/15/2018   BUN 23 08/04/2017   CREATININE 1.03 12/15/2018    Past medical and surgical history were reviewed and updated in EPIC.  Current Meds  Medication Sig  . aspirin EC 81 MG tablet Take 81 mg by mouth daily.  . Evolocumab (REPATHA SURECLICK) 076 MG/ML SOAJ Inject 1 pen into the skin every 14 (fourteen) days.  . Ibuprofen-diphenhydrAMINE Cit (ADVIL PM) 200-38 MG TABS Take 2 tablets by mouth at bedtime.  . isosorbide mononitrate (IMDUR) 30 MG 24 hr tablet TAKE 1 TABLET ONCE DAILY.  . Melatonin 5 MG CAPS Take 2 capsules by mouth at bedtime.   . nitroGLYCERIN (NITROSTAT) 0.4 MG SL tablet Place 1 tablet (0.4 mg total) under the tongue every 5 (five) minutes as needed for chest pain.    Allergies: Other  Social History   Tobacco Use  . Smoking status: Former Smoker    Packs/day: 1.00    Years: 2.00    Pack years: 2.00    Types: Cigarettes    Quit date: 08/03/1969    Years since quitting: 49.8  . Smokeless tobacco: Never Used  Substance Use Topics  . Alcohol use: Yes    Alcohol/week: 7.0 standard drinks    Types: 7 Standard drinks or equivalent per week  .  Drug use: No    Family History  Problem Relation Age of Onset  . Multiple myeloma Mother   . Cancer - Other Mother 39       BREAST  . Other Mother        sepsis  . Dementia Father   . Cancer Father        neck   . Cancer - Other Maternal Grandmother   . Diabetes Maternal Grandmother   . Heart attack Maternal Grandfather 65  . Dementia Paternal Grandfather   . Healthy Son   .  Healthy Son   . Healthy Daughter   . Heart attack Cousin   . Cancer Paternal Grandmother     Review of Systems: A 12-system review of systems was performed and was negative except as noted in the HPI.  --------------------------------------------------------------------------------------------------  Physical Exam: BP 128/68 (BP Location: Left Arm, Patient Position: Sitting, Cuff Size: Normal)   Pulse 74   Ht 5' 10"  (1.778 m)   Wt 169 lb 12 oz (77 kg)   BMI 24.36 kg/m   General: NAD. HEENT: No conjunctival pallor or scleral icterus.  Facemask in place. Neck: Supple without lymphadenopathy, thyromegaly, JVD, or HJR. Lungs: Normal work of breathing. Clear to auscultation bilaterally without wheezes or crackles. Heart: Regular rate and rhythm without murmurs, rubs, or gallops. Non-displaced PMI. Abd: Bowel sounds present. Soft, NT/ND without hepatosplenomegaly Ext: No lower extremity edema. Radial, PT, and DP pulses are 2+ bilaterally. Skin: Warm and dry without rash.  EKG: Normal sinus rhythm without abnormality.  Lab Results  Component Value Date   WBC 6.4 12/15/2018   HGB 15.7 12/15/2018   HCT 48.4 12/15/2018   MCV 93.8 12/15/2018   PLT 174 12/15/2018    Lab Results  Component Value Date   NA 136 12/15/2018   K 4.6 12/15/2018   CL 105 12/15/2018   CO2 23 12/15/2018   BUN 18 12/15/2018   CREATININE 1.03 12/15/2018   GLUCOSE 126 (H) 12/15/2018   ALT 26 12/15/2018    Lab Results  Component Value Date   CHOL 101 12/20/2017   HDL 43 12/20/2017   LDLCALC 43 12/20/2017   TRIG 74 12/20/2017   CHOLHDL 2.3 12/20/2017    --------------------------------------------------------------------------------------------------  ASSESSMENT AND PLAN: Coronary artery disease with stable angina: [name] continues to do well.  He does not have any symptoms to suggest worsening coronary insufficiency.  He would like to discontinue isosorbide mononitrate in order to allow him  to use a PDE 5 inhibitor for management of erectile dysfunction.  We have discussed discontinuing this and adding an alternative anti-anginal medication.  We have agreed to defer adding another agent at this time.  Should he have recurrent angina, I would favor trying amlodipine or ranolazine.  We will continue with aspirin and PCSK9 inhibitor therapy to prevent progression of moderate CAD.  I counseled [name] not to use sublingual nitroglycerin within 24 hours of sildenafil or 48 hours of tadalafil use but to instead seek immediate medical attention for chest pain.  Hyperlipidemia: LDL well controlled on last check through Dr. Raul Del office in 07/2018 (LDL 57).  We will continue current dose of Repatha; repeat labs due next month with Dr. Brigitte Pulse.  Thoracic aortic aneurysm: Mild enlargement of ascending aorta noted on prior cross-sectional imaging, stable on most recent CTA in 10/2018.  We will continue with lipid management.  Plan for repeat cross-sectional imaging in 1 year with Dr. Brigitte Pulse.  Follow-up: Return to clinic in  1 year.  Nelva Bush, MD 06/12/2019 8:35 AM

## 2019-06-12 ENCOUNTER — Encounter: Payer: Self-pay | Admitting: Internal Medicine

## 2019-06-12 ENCOUNTER — Ambulatory Visit (INDEPENDENT_AMBULATORY_CARE_PROVIDER_SITE_OTHER): Payer: Medicare Other | Admitting: Internal Medicine

## 2019-06-12 ENCOUNTER — Other Ambulatory Visit: Payer: Self-pay

## 2019-06-12 VITALS — BP 128/68 | HR 74 | Ht 70.0 in | Wt 169.8 lb

## 2019-06-12 DIAGNOSIS — E785 Hyperlipidemia, unspecified: Secondary | ICD-10-CM | POA: Diagnosis not present

## 2019-06-12 DIAGNOSIS — I712 Thoracic aortic aneurysm, without rupture, unspecified: Secondary | ICD-10-CM

## 2019-06-12 DIAGNOSIS — I25118 Atherosclerotic heart disease of native coronary artery with other forms of angina pectoris: Secondary | ICD-10-CM | POA: Diagnosis not present

## 2019-06-12 NOTE — Patient Instructions (Signed)
Medication Instructions:  Your physician recommends that you continue on your current medications as directed. Please refer to the Current Medication list given to you today.  If you need a refill on your cardiac medications before your next appointment, please call your pharmacy.   Lab work: None ordered  If you have labs (blood work) drawn today and your tests are completely normal, you will receive your results only by: . MyChart Message (if you have MyChart) OR . A paper copy in the mail If you have any lab test that is abnormal or we need to change your treatment, we will call you to review the results.  Testing/Procedures: None ordered   Follow-Up: At [facility], you and your health needs are our priority.  As part of our continuing mission to provide you with exceptional heart care, we have created designated Provider Care Teams.  These Care Teams include your primary Cardiologist (physician) and Advanced Practice Providers (APPs -  Physician Assistants and Nurse Practitioners) who all work together to provide you with the care you need, when you need it. You will need a follow up appointment in 12 months.  Please call our office 2 months in advance to schedule this appointment.  You may see Christopher End, MD or one of the following Advanced Practice Providers on your designated Care Team:   Christopher Berge, NP Ryan Dunn, PA-C . Jacquelyn Visser, PA-C   

## 2019-06-17 ENCOUNTER — Other Ambulatory Visit: Payer: Self-pay | Admitting: Internal Medicine

## 2019-06-20 DIAGNOSIS — Z23 Encounter for immunization: Secondary | ICD-10-CM | POA: Diagnosis not present

## 2019-07-15 DIAGNOSIS — E7849 Other hyperlipidemia: Secondary | ICD-10-CM | POA: Diagnosis not present

## 2019-07-15 DIAGNOSIS — R7301 Impaired fasting glucose: Secondary | ICD-10-CM | POA: Diagnosis not present

## 2019-07-15 DIAGNOSIS — Z125 Encounter for screening for malignant neoplasm of prostate: Secondary | ICD-10-CM | POA: Diagnosis not present

## 2019-07-18 DIAGNOSIS — R82998 Other abnormal findings in urine: Secondary | ICD-10-CM | POA: Diagnosis not present

## 2019-07-22 DIAGNOSIS — R7301 Impaired fasting glucose: Secondary | ICD-10-CM | POA: Diagnosis not present

## 2019-07-22 DIAGNOSIS — Z0000 Encounter for general adult medical examination without abnormal findings: Secondary | ICD-10-CM | POA: Diagnosis not present

## 2019-07-22 DIAGNOSIS — R030 Elevated blood-pressure reading, without diagnosis of hypertension: Secondary | ICD-10-CM | POA: Diagnosis not present

## 2019-07-22 DIAGNOSIS — E782 Mixed hyperlipidemia: Secondary | ICD-10-CM | POA: Diagnosis not present

## 2019-07-24 DIAGNOSIS — Z1212 Encounter for screening for malignant neoplasm of rectum: Secondary | ICD-10-CM | POA: Diagnosis not present

## 2019-08-14 DIAGNOSIS — S81819A Laceration without foreign body, unspecified lower leg, initial encounter: Secondary | ICD-10-CM | POA: Diagnosis not present

## 2019-08-19 IMAGING — CT CT HEART SCORING
1 of 2 series · 11 of 20 positions shown, 14 images · non-contrast
Comparison: None.

CLINICAL DATA: Hyperlipidemia

EXAM:
CT HEART FOR CALCIUM SCORING
TECHNIQUE: CT heart was performed on a 64 channel system using prospective ECG
gating.
A non-contrast exam for calcium scoring was performed.
Note that this exam targets the heart and the chest was not imaged
in its entirety.

[Series 2: smartscore - gated 0.4 sec · axial · 0.49mm/px · z∈[-208,-78]mm · 11 of 64 slices shown, 14 images]
[im 6/64  vessel]
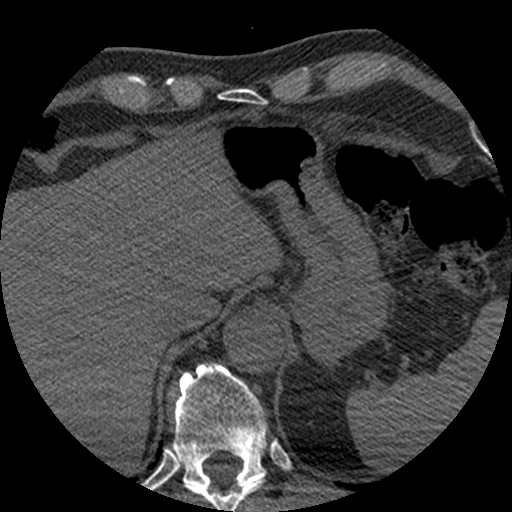
[im 6/64  lung]
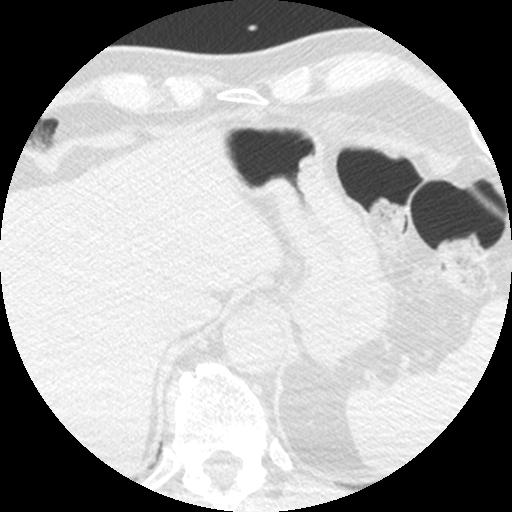
[im 11/64  vessel]
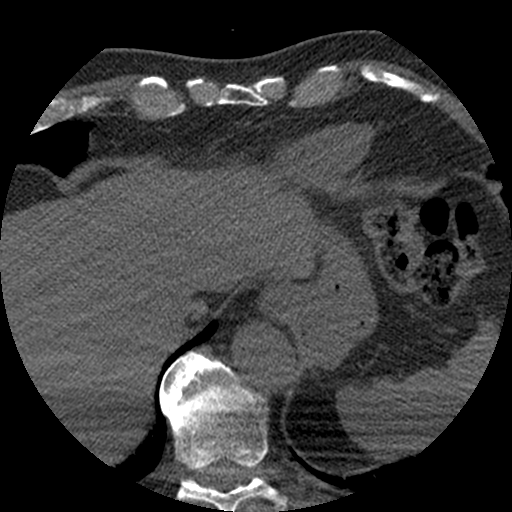
[im 16/64  vessel]
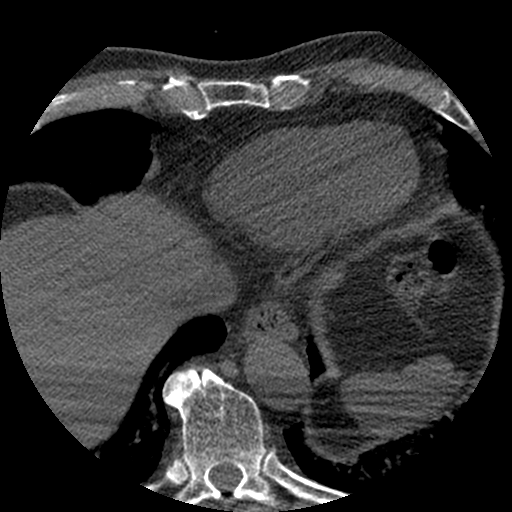
[im 22/64  vessel]
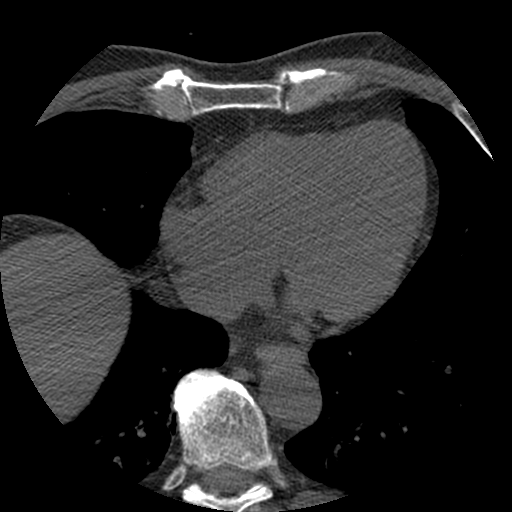
[im 27/64  vessel]
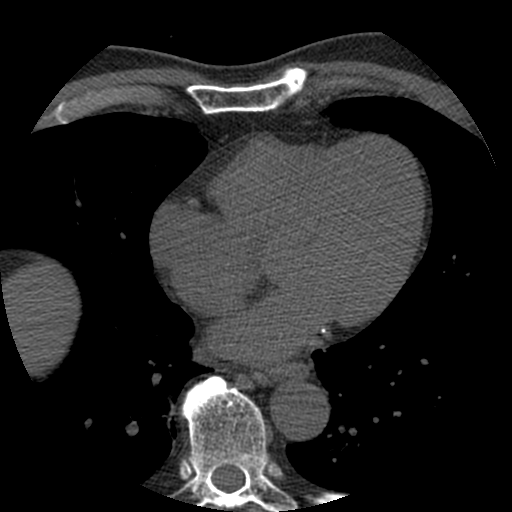
[im 27/64  lung]
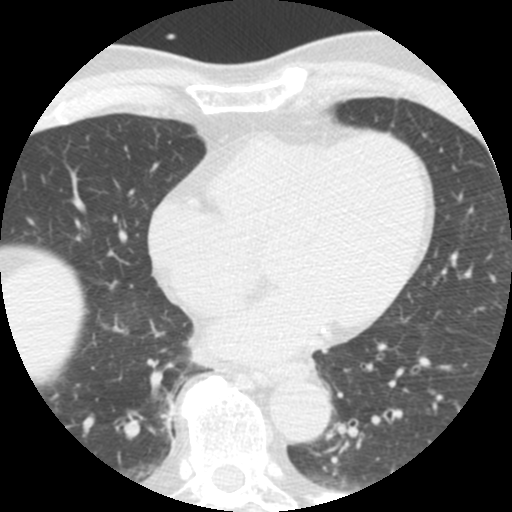
[im 32/64  vessel]
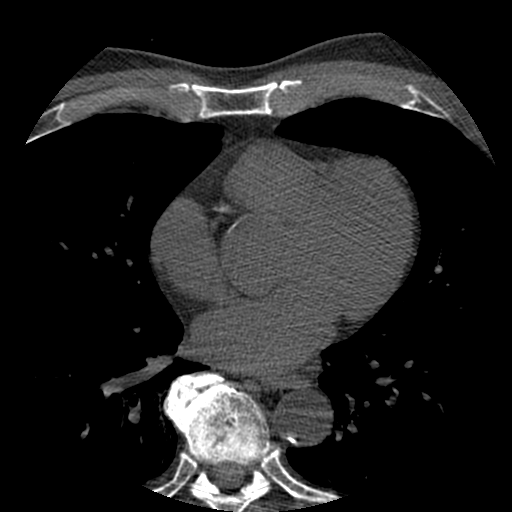
[im 37/64  vessel]
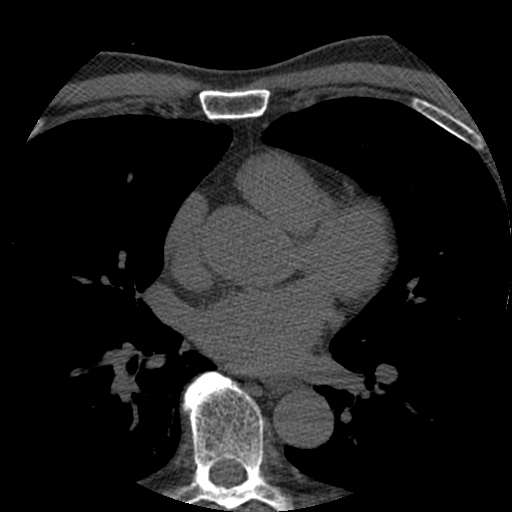
[im 43/64  vessel]
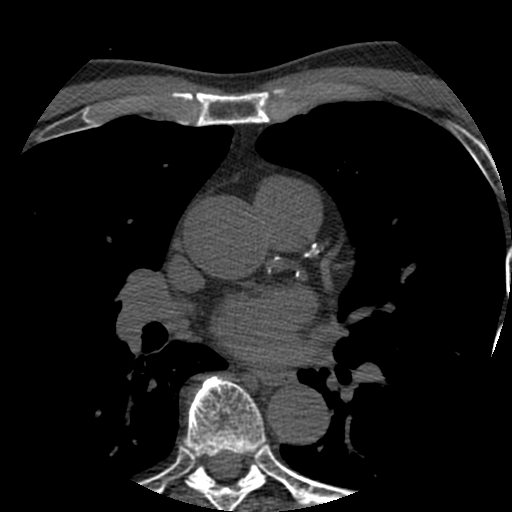
[im 48/64  vessel]
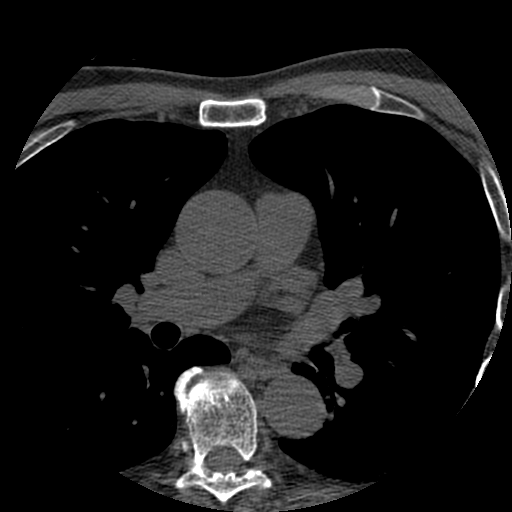
[im 48/64  lung]
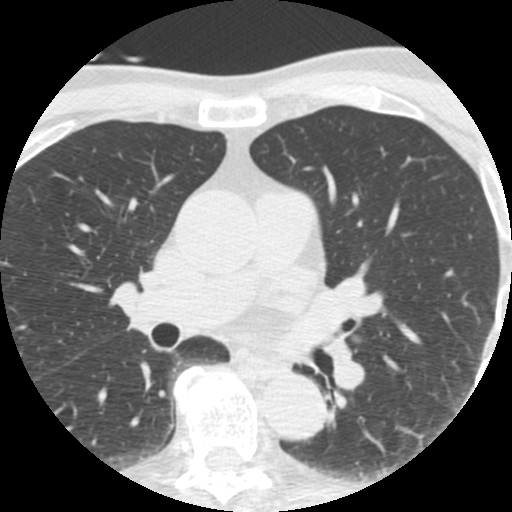
[im 53/64  vessel]
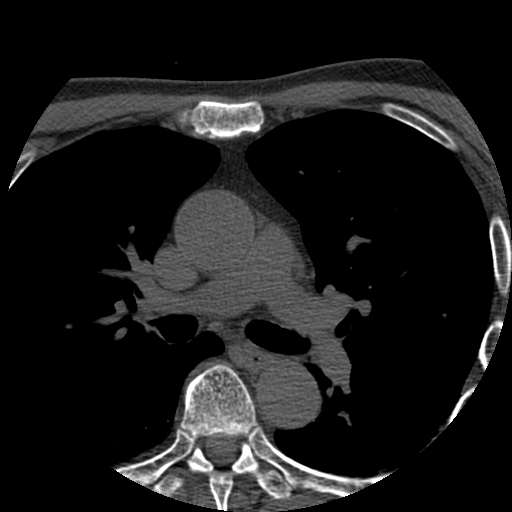
[im 58/64  vessel]
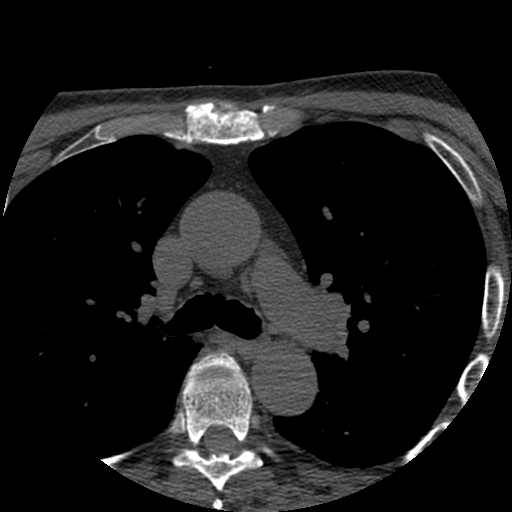

[11 of 20 positions shown; findings below may reference images not displayed]

FINDINGS: Technical quality: Good.

CORONARY CALCIUM

Total Agatston Score: 952, with most pronounced calcifications in
the left anterior descending coronary artery. Calcifications also
noted in the left main, left circumflex, and right coronary.

[HOSPITAL] percentile:  83

OTHER FINDINGS:

Cardiovascular: Aortic calcifications. Ascending thoracic aorta
slightly dilated at 4 cm.

Mediastinum/Nodes: No adenopathy in the lower mediastinum or hila.

Lungs/Pleura: 18 mm nodule in the left lung base posteriorly in the
left lower lobe. No effusions.

Upper Abdomen: Imaging into the upper abdomen shows no acute
findings.

Musculoskeletal: Chest wall soft tissues are unremarkable. No acute
bony abnormality.
IMPRESSION: Total coronary calcium score of 952 with extensive calcifications
noted in the left anterior descending artery, with calcifications
also noted in the left main, left circumflex, and right coronary
arteries. This is eighty-third percentile based on age and gender
controlled group.

Aortic atherosclerosis. Slight aneurysmal dilatation of the
ascending thoracic aorta, 4 cm. Recommend annual imaging followup by
CTA or MRA. This recommendation follows 7848
ACCF/AHA/AATS/ACR/ASA/SCA/GIUDICE/TAIS/HARGUEM/TED Guidelines for the
Diagnosis and Management of Patients with Thoracic Aortic Disease.
Circulation. 7848; 121: e266-e369

A 18 mm pulmonary nodule in the left lower lobe posteriorly at the
left base. Consider one of the following in 3 months for both
low-risk and high-risk individuals: (a) repeat chest CT, (b)
follow-up PET-CT, or (c) tissue sampling. This recommendation
follows the consensus statement: Guidelines for Management of
Incidental Pulmonary Nodules Detected on CT Images: From the

## 2019-11-21 ENCOUNTER — Ambulatory Visit: Payer: Medicare Other | Attending: Internal Medicine

## 2019-11-21 DIAGNOSIS — Z23 Encounter for immunization: Secondary | ICD-10-CM | POA: Insufficient documentation

## 2019-11-21 NOTE — Progress Notes (Signed)
   Covid-19 Vaccination Clinic  Name:  [name]    [name]: [id number] DOB: February 07, 1946  11/21/2019  [name] was observed post Covid-19 immunization for 15 minutes without incidence. He was provided with Vaccine Information Sheet and instruction to access the V-Safe system.   [name] was instructed to call 911 with any severe reactions post vaccine: Marland Kitchen Difficulty breathing  . Swelling of your face and throat  . A fast heartbeat  . A bad rash all over your body  . Dizziness and weakness    Immunizations Administered    Name Date Dose VIS Date Route   Pfizer COVID-19 Vaccine 11/21/2019 10:32 AM 0.3 mL 10/11/2019 Intramuscular   Manufacturer: [employer], [employer]   Lot: [id number]   NDC: [address]-9

## 2019-11-22 ENCOUNTER — Other Ambulatory Visit: Payer: Self-pay

## 2019-11-22 MED ORDER — REPATHA SURECLICK 140 MG/ML ~~LOC~~ SOAJ: 1.0000 "pen " | SUBCUTANEOUS | 1 refills | Status: DC

## 2019-12-09 ENCOUNTER — Ambulatory Visit: Payer: Medicare Other

## 2019-12-11 ENCOUNTER — Ambulatory Visit: Payer: Medicare Other | Attending: Internal Medicine

## 2019-12-11 DIAGNOSIS — Z23 Encounter for immunization: Secondary | ICD-10-CM | POA: Insufficient documentation

## 2019-12-11 NOTE — Progress Notes (Signed)
   Covid-19 Vaccination Clinic  Name:  John Mcmahon    [name]: [id number] DOB: 1946-09-06  12/11/2019  John Mcmahon was observed post Covid-19 immunization for 15 minutes without incidence. He was provided with Vaccine Information Sheet and instruction to access the V-Safe system.   John Mcmahon was instructed to call 911 with any severe reactions post vaccine: Marland Kitchen Difficulty breathing  . Swelling of your face and throat  . A fast heartbeat  . A bad rash all over your body  . Dizziness and weakness    Immunizations Administered    Name Date Dose VIS Date Route   Pfizer COVID-19 Vaccine 12/11/2019 10:10 AM 0.3 mL 10/11/2019 Intramuscular   Manufacturer: [employer], [employer]   Lot: [id number]   NDC: [address]-8

## 2019-12-21 ENCOUNTER — Other Ambulatory Visit: Payer: Self-pay | Admitting: Internal Medicine

## 2019-12-21 DIAGNOSIS — I712 Thoracic aortic aneurysm, without rupture, unspecified: Secondary | ICD-10-CM

## 2020-01-03 ENCOUNTER — Other Ambulatory Visit: Payer: Medicare Other

## 2020-01-24 ENCOUNTER — Ambulatory Visit
Admission: RE | Admit: 2020-01-24 | Discharge: 2020-01-24 | Disposition: A | Discharge status: Home / Self Care | Payer: Medicare Other | Source: Ambulatory Visit | Attending: Internal Medicine | Admitting: Internal Medicine

## 2020-01-24 DIAGNOSIS — I712 Thoracic aortic aneurysm, without rupture, unspecified: Secondary | ICD-10-CM

## 2020-01-24 MED ORDER — IOPAMIDOL (ISOVUE-370) INJECTION 76%
75.0000 mL | Freq: Once | INTRAVENOUS | Status: AC | PRN
  Administered 2020-01-24: 75 mL via INTRAVENOUS

## 2020-02-24 DIAGNOSIS — H524 Presbyopia: Secondary | ICD-10-CM | POA: Diagnosis not present

## 2020-02-24 DIAGNOSIS — Z961 Presence of intraocular lens: Secondary | ICD-10-CM | POA: Diagnosis not present

## 2020-02-24 DIAGNOSIS — H52203 Unspecified astigmatism, bilateral: Secondary | ICD-10-CM | POA: Diagnosis not present

## 2020-02-26 DIAGNOSIS — L738 Other specified follicular disorders: Secondary | ICD-10-CM | POA: Diagnosis not present

## 2020-02-26 DIAGNOSIS — L80 Vitiligo: Secondary | ICD-10-CM | POA: Diagnosis not present

## 2020-02-26 DIAGNOSIS — D1801 Hemangioma of skin and subcutaneous tissue: Secondary | ICD-10-CM | POA: Diagnosis not present

## 2020-02-26 DIAGNOSIS — L812 Freckles: Secondary | ICD-10-CM | POA: Diagnosis not present

## 2020-02-27 DIAGNOSIS — H524 Presbyopia: Secondary | ICD-10-CM | POA: Diagnosis not present

## 2020-05-06 ENCOUNTER — Other Ambulatory Visit: Payer: Self-pay | Admitting: Cardiovascular Disease

## 2020-06-18 ENCOUNTER — Ambulatory Visit: Payer: Medicare Other | Admitting: Internal Medicine

## 2020-06-18 ENCOUNTER — Other Ambulatory Visit: Payer: Self-pay

## 2020-06-18 ENCOUNTER — Encounter: Payer: Self-pay | Admitting: Internal Medicine

## 2020-06-18 VITALS — BP 124/72 | HR 68 | Ht 70.0 in | Wt 171.1 lb

## 2020-06-18 DIAGNOSIS — I712 Thoracic aortic aneurysm, without rupture, unspecified: Secondary | ICD-10-CM | POA: Insufficient documentation

## 2020-06-18 DIAGNOSIS — E785 Hyperlipidemia, unspecified: Secondary | ICD-10-CM

## 2020-06-18 DIAGNOSIS — I2510 Atherosclerotic heart disease of native coronary artery without angina pectoris: Secondary | ICD-10-CM

## 2020-06-18 DIAGNOSIS — G720 Drug-induced myopathy: Secondary | ICD-10-CM

## 2020-06-18 DIAGNOSIS — T466X5A Adverse effect of antihyperlipidemic and antiarteriosclerotic drugs, initial encounter: Secondary | ICD-10-CM

## 2020-06-18 NOTE — Patient Instructions (Signed)
Medication Instructions:  Your physician recommends that you continue on your current medications as directed. Please refer to the Current Medication list given to you today.  *If you need a refill on your cardiac medications before your next appointment, please call your pharmacy*  Follow-Up: At [facility], you and your health needs are our priority.  As part of our continuing mission to provide you with exceptional heart care, we have created designated Provider Care Teams.  These Care Teams include your primary Cardiologist (physician) and Advanced Practice Providers (APPs -  Physician Assistants and Nurse Practitioners) who all work together to provide you with the care you need, when you need it.  We recommend signing up for the patient portal called "MyChart".  Sign up information is provided on this After Visit Summary.  MyChart is used to connect with patients for Virtual Visits (Telemedicine).  Patients are able to view lab/test results, encounter notes, upcoming appointments, etc.  Non-urgent messages can be sent to your provider as well.   To learn more about what you can do with MyChart, go to [email or web address].    Your next appointment:   12 month(s)  The format for your next appointment:   In Person  Provider:    You may see Christopher End, MD or one of the following Advanced Practice Providers on your designated Care Team:    Christopher Berge, NP  Ryan Dunn, PA-C  Jacquelyn Visser, PA-C 

## 2020-06-18 NOTE — Progress Notes (Signed)
Follow-up Outpatient Visit Date: 06/18/2020  Primary Care Provider: Marton Redwood, MD [place] [address]  Chief Complaint: Follow-up coronary artery disease  HPI:  Mr. Duquette is a 74 y.o. male with history of coronary artery disease with moderate multivessel CAD, thoracic aortic aneurysm with mildly dilated aortic arch (followed by Dr. Brigitte Pulse) hyperlipidemia, and impaired fasting glucose, who presents for follow-up of coronary artery disease.  I last saw Mr. Mofield a year ago, at which time he was feeling well other than occasional orthostatic lightheadedness.  We agreed to discontinue isosorbide mononitrate, as Mr. Linsley wished to try a PDE5 inhibitor for management of erectile dysfunction.  Today, Mr. Nudd reports that he is feeling very well. He has not had any chest pain or shortness of breath, palpitations, or edema. He has occasional brief orthostatic lightheadedness, which actually has decreased in frequency with discontinuation of isosorbide mononitrate. He walks regularly and is active around the house without limitations. He is scheduled for follow-up with Dr. Brigitte Pulse next month, including lab work.  --------------------------------------------------------------------------------------------------  Cardiovascular History & Procedures: Cardiovascular Problems:  Mild to moderate coronary artery disease  Thoracic aortic aneurysm (mild dilation of aortic arch)  Risk Factors:  Known coronary artery disease, hyperlipidemia, male gender, age greater than 45  Cath/PCI:  LHC (08/07/17): LMCA normal. LAD with 50% midvessel stenosis. Codominant LCx with 40% lPDA lesion. 50% proximal and 25% mid RCA lesions. LVEF 55-65%. LVEDP 10 mmHg.  CV Surgery:  None  EP Procedures and Devices:  None  Non-Invasive Evaluation(s):  Coronary calcium score (06/29/17): Calcium score of 952 with extensive calcifications in the LAD him a as well as less prominent calcification  involving the LMCA, LCx, and RCA. Slight aneurysmal dilation of ascending aortic, measuring 4.0 cm.18 mmleft lower lobe nodule.  Recent CV Pertinent Labs: Lab Results  Component Value Date   CHOL 101 12/20/2017   HDL 43 12/20/2017   LDLCALC 43 12/20/2017   TRIG 74 12/20/2017   CHOLHDL 2.3 12/20/2017   INR 1.06 12/15/2018   K 4.6 12/15/2018   BUN 18 12/15/2018   BUN 23 08/04/2017   CREATININE 1.03 12/15/2018    Past medical and surgical history were reviewed and updated in EPIC.  Current Meds  Medication Sig  . aspirin EC 81 MG tablet Take 81 mg by mouth daily.  . Ibuprofen-diphenhydrAMINE Cit (ADVIL PM) 200-38 MG TABS Take 2 tablets by mouth at bedtime.  . Melatonin 5 MG CAPS Take 2 capsules by mouth at bedtime.   . nitroGLYCERIN (NITROSTAT) 0.4 MG SL tablet Place 1 tablet (0.4 mg total) under the tongue every 5 (five) minutes as needed for chest pain.  Marland Kitchen REPATHA SURECLICK 338 MG/ML SOAJ INJECT 1 PEN INTO THE SKIN EVERY 14 DAYS    Allergies: Other  Social History   Tobacco Use  . Smoking status: Former Smoker    Packs/day: 1.00    Years: 2.00    Pack years: 2.00    Types: Cigarettes    Quit date: 08/03/1969    Years since quitting: 50.9  . Smokeless tobacco: Never Used  Vaping Use  . Vaping Use: Never used  Substance Use Topics  . Alcohol use: Yes    Alcohol/week: 7.0 standard drinks    Types: 7 Standard drinks or equivalent per week  . Drug use: No    Family History  Problem Relation Age of Onset  . Multiple myeloma Mother   . Cancer - Other Mother 33  BREAST  . Other Mother        sepsis  . Dementia Father   . Cancer Father        neck   . Cancer - Other Maternal Grandmother   . Diabetes Maternal Grandmother   . Heart attack Maternal Grandfather 65  . Dementia Paternal Grandfather   . Healthy Son   . Healthy Son   . Healthy Daughter   . Heart attack Cousin   . Cancer Paternal Grandmother     Review of Systems: A 12-system review of  systems was performed and was negative except as noted in the HPI.  --------------------------------------------------------------------------------------------------  Physical Exam: BP 124/72 (BP Location: Left Arm, Patient Position: Sitting, Cuff Size: Normal)   Pulse 68   Ht 5' 10"  (1.778 m)   Wt 171 lb 2 oz (77.6 kg)   SpO2 98%   BMI 24.55 kg/m   General: NAD. Neck: No JVD or HJR. Lungs: Clear to auscultation bilaterally without wheezes or crackles. Heart: Regular rate and rhythm without murmurs, rubs, or gallops. Abdomen: Soft, nontender, nondistended. Extremities: No lower extremity edema.  EKG: Normal sinus rhythm without abnormality.  Lab Results  Component Value Date   WBC 6.4 12/15/2018   HGB 15.7 12/15/2018   HCT 48.4 12/15/2018   MCV 93.8 12/15/2018   PLT 174 12/15/2018    Lab Results  Component Value Date   NA 136 12/15/2018   K 4.6 12/15/2018   CL 105 12/15/2018   CO2 23 12/15/2018   BUN 18 12/15/2018   CREATININE 1.03 12/15/2018   GLUCOSE 126 (H) 12/15/2018   ALT 26 12/15/2018    Lab Results  Component Value Date   CHOL 101 12/20/2017   HDL 43 12/20/2017   LDLCALC 43 12/20/2017   TRIG 74 12/20/2017   CHOLHDL 2.3 12/20/2017    --------------------------------------------------------------------------------------------------  ASSESSMENT AND PLAN: Coronary artery disease: Mr. Edgell continues to do well without any recurrent angina, now off isosorbide mononitrate. We will continue aspirin and evolocumab for secondary prevention.  Hyperlipidemia: Continue evolocumab. Mr. Pariseau is scheduled for follow-up with Dr. Brigitte Pulse next month, at which time a lipid panel and LFTs should be checked to ensure adequate response to lipid therapy.  Thoracic aortic aneurysm: Mild fusiform dilation of the transverse aorta previously noted, stable on most recent CTA of the chest. I recommend continued lipid control as well as monitoring of blood pressure. Ongoing  follow-up of TAA per Dr. Brigitte Pulse.  Follow-up: Return to clinic in 1 year.  Nelva Bush, MD 06/18/2020 1:33 PM

## 2020-07-17 DIAGNOSIS — Z125 Encounter for screening for malignant neoplasm of prostate: Secondary | ICD-10-CM | POA: Diagnosis not present

## 2020-07-17 DIAGNOSIS — E785 Hyperlipidemia, unspecified: Secondary | ICD-10-CM | POA: Diagnosis not present

## 2020-07-17 DIAGNOSIS — R7301 Impaired fasting glucose: Secondary | ICD-10-CM | POA: Diagnosis not present

## 2020-07-21 DIAGNOSIS — E785 Hyperlipidemia, unspecified: Secondary | ICD-10-CM | POA: Diagnosis not present

## 2020-07-21 DIAGNOSIS — R82998 Other abnormal findings in urine: Secondary | ICD-10-CM | POA: Diagnosis not present

## 2020-07-27 DIAGNOSIS — D485 Neoplasm of uncertain behavior of skin: Secondary | ICD-10-CM | POA: Diagnosis not present

## 2020-07-27 DIAGNOSIS — D0439 Carcinoma in situ of skin of other parts of face: Secondary | ICD-10-CM | POA: Diagnosis not present

## 2020-07-29 DIAGNOSIS — Z1212 Encounter for screening for malignant neoplasm of rectum: Secondary | ICD-10-CM | POA: Diagnosis not present

## 2020-08-13 DIAGNOSIS — Z20822 Contact with and (suspected) exposure to covid-19: Secondary | ICD-10-CM | POA: Diagnosis not present

## 2020-08-13 DIAGNOSIS — Z03818 Encounter for observation for suspected exposure to other biological agents ruled out: Secondary | ICD-10-CM | POA: Diagnosis not present

## 2020-08-26 DIAGNOSIS — Z85828 Personal history of other malignant neoplasm of skin: Secondary | ICD-10-CM | POA: Diagnosis not present

## 2020-08-26 DIAGNOSIS — C44329 Squamous cell carcinoma of skin of other parts of face: Secondary | ICD-10-CM | POA: Diagnosis not present

## 2020-09-22 DIAGNOSIS — G720 Drug-induced myopathy: Secondary | ICD-10-CM | POA: Insufficient documentation

## 2020-09-22 DIAGNOSIS — T466X5A Adverse effect of antihyperlipidemic and antiarteriosclerotic drugs, initial encounter: Secondary | ICD-10-CM | POA: Insufficient documentation

## 2020-10-21 ENCOUNTER — Other Ambulatory Visit: Payer: Self-pay | Admitting: Cardiovascular Disease

## 2021-01-13 DIAGNOSIS — M25512 Pain in left shoulder: Secondary | ICD-10-CM | POA: Diagnosis not present

## 2021-02-25 DIAGNOSIS — L570 Actinic keratosis: Secondary | ICD-10-CM | POA: Diagnosis not present

## 2021-02-25 DIAGNOSIS — L821 Other seborrheic keratosis: Secondary | ICD-10-CM | POA: Diagnosis not present

## 2021-02-25 DIAGNOSIS — D2262 Melanocytic nevi of left upper limb, including shoulder: Secondary | ICD-10-CM | POA: Diagnosis not present

## 2021-02-25 DIAGNOSIS — Z85828 Personal history of other malignant neoplasm of skin: Secondary | ICD-10-CM | POA: Diagnosis not present

## 2021-03-11 DIAGNOSIS — Z20822 Contact with and (suspected) exposure to covid-19: Secondary | ICD-10-CM | POA: Diagnosis not present

## 2021-04-05 ENCOUNTER — Emergency Department (HOSPITAL_COMMUNITY)
Admission: EM | Admit: 2021-04-05 | Discharge: 2021-04-05 | Disposition: A | Discharge status: Home / Self Care | Payer: Medicare Other | Attending: Emergency Medicine | Admitting: Emergency Medicine

## 2021-04-05 ENCOUNTER — Telehealth: Payer: Self-pay | Admitting: Internal Medicine

## 2021-04-05 ENCOUNTER — Emergency Department (HOSPITAL_COMMUNITY): Payer: Medicare Other

## 2021-04-05 DIAGNOSIS — Z7982 Long term (current) use of aspirin: Secondary | ICD-10-CM | POA: Insufficient documentation

## 2021-04-05 DIAGNOSIS — Y9353 Activity, golf: Secondary | ICD-10-CM | POA: Diagnosis not present

## 2021-04-05 DIAGNOSIS — R6884 Jaw pain: Secondary | ICD-10-CM | POA: Insufficient documentation

## 2021-04-05 DIAGNOSIS — M542 Cervicalgia: Secondary | ICD-10-CM | POA: Diagnosis not present

## 2021-04-05 DIAGNOSIS — I25118 Atherosclerotic heart disease of native coronary artery with other forms of angina pectoris: Secondary | ICD-10-CM | POA: Insufficient documentation

## 2021-04-05 DIAGNOSIS — R202 Paresthesia of skin: Secondary | ICD-10-CM

## 2021-04-05 DIAGNOSIS — Z87891 Personal history of nicotine dependence: Secondary | ICD-10-CM | POA: Diagnosis not present

## 2021-04-05 DIAGNOSIS — I10 Essential (primary) hypertension: Secondary | ICD-10-CM | POA: Insufficient documentation

## 2021-04-05 LAB — BASIC METABOLIC PANEL
Anion gap: 9 (ref 5–15)
BUN: 24 mg/dL — ABNORMAL HIGH (ref 8–23)
CO2: 22 mmol/L (ref 22–32)
Calcium: 9.1 mg/dL (ref 8.9–10.3)
Chloride: 105 mmol/L (ref 98–111)
Creatinine, Ser: 0.95 mg/dL (ref 0.61–1.24)
GFR, Estimated: >60 mL/min (ref >60)
Glucose, Bld: 132 mg/dL — ABNORMAL HIGH (ref 70–99)
Potassium: 4.5 mmol/L (ref 3.5–5.1)
Sodium: 136 mmol/L (ref 135–145)

## 2021-04-05 LAB — CBC
HCT: 46.3 % (ref 39.0–52.0)
Hemoglobin: 15.2 g/dL (ref 13.0–17.0)
MCH: 30.9 pg (ref 26.0–34.0)
MCHC: 32.8 g/dL (ref 30.0–36.0)
MCV: 94.1 fL (ref 80.0–100.0)
Platelets: 198 10*3/uL (ref 150–400)
RBC: 4.92 MIL/uL (ref 4.22–5.81)
RDW: 12.2 % (ref 11.5–15.5)
WBC: 7.7 10*3/uL (ref 4.0–10.5)
nRBC: 0 % (ref 0.0–0.2)

## 2021-04-05 LAB — TROPONIN I (HIGH SENSITIVITY)
Troponin I (High Sensitivity): 4 ng/L (ref <18)
Troponin I (High Sensitivity): 4 ng/L (ref <18)

## 2021-04-05 NOTE — Telephone Encounter (Signed)
Patient calling in to report some tingling, neck and jaw pain and left arm tingling. Patient also reports feeling strange in the heat. Patient reports no medication changes and nothing out of the ordinary for him.   Please advise

## 2021-04-05 NOTE — ED Provider Notes (Signed)
[facility] EMERGENCY DEPARTMENT Provider Note   CSN: [id number] Arrival date & time: 04/05/21  1036     History Chief Complaint  Patient presents with  . Jaw Pain    John Mcmahon is a 74 y.o. male.  HPI      John Mcmahon is a 75 y.o. male, with a history of HTN, hyperlipidemia, presenting to the ED at the advice of the cardiology office. Patient states for the last couple weeks he has been experiencing intermittent episodes of abnormal sensations to the left hand and forearm.  He also notes discomfort in the left side of the neck. Patient states prior to coming to the ED he played a full 18 holes of golf prior to coming to the ED, walked the entire course, and had no worsening in his symptoms or onset of additional symptoms.  Denies fever/chills, dizziness, syncope, chest pain, shortness of breath, back pain, weakness, N/V/D, headaches, confusion, head injury, or any other complaints.   Past Medical History:  Diagnosis Date  . Acute meniscal tear, lateral, left, initial encounter   . ED (erectile dysfunction)   . Elevated blood pressure reading in office with white coat syndrome, without diagnosis of hypertension   . Heart disease   . Hyperlipidemia   . IFG (impaired fasting glucose)   . Insomnia   . Lumbago     Patient Active Problem List   Diagnosis Date Noted  . Statin myopathy 09/22/2020  . Thoracic aortic aneurysm without rupture ([place]) 06/18/2020  . TGA (transient global amnesia) 12/28/2018  . Coronary artery disease of native artery of native heart with stable angina pectoris ([place]) 08/04/2017  . Hyperlipidemia LDL goal <70 08/04/2017  . Coronary artery calcification 08/04/2017    Past Surgical History:  Procedure Laterality Date  . CATARACT EXTRACTION    . COLONOSCOPY    . LEFT HEART CATH AND CORONARY ANGIOGRAPHY N/A 08/07/2017   Procedure: LEFT HEART CATH AND CORONARY ANGIOGRAPHY;  Surgeon: Nelva Bush, MD;  Location: [facility];  Service: Cardiovascular;  Laterality: N/A;       Family History  Problem Relation Age of Onset  . Multiple myeloma Mother   . Cancer - Other Mother 38       BREAST  . Other Mother        sepsis  . Dementia Father   . Cancer Father        neck   . Cancer - Other Maternal Grandmother   . Diabetes Maternal Grandmother   . Heart attack Maternal Grandfather 65  . Dementia Paternal Grandfather   . Healthy Son   . Healthy Son   . Healthy Daughter   . Heart attack Cousin   . Cancer Paternal Grandmother     Social History   Tobacco Use  . Smoking status: Former Smoker    Packs/day: 1.00    Years: 2.00    Pack years: 2.00    Types: Cigarettes    Quit date: 08/03/1969    Years since quitting: 51.7  . Smokeless tobacco: Never Used  Vaping Use  . Vaping Use: Never used  Substance Use Topics  . Alcohol use: Yes    Alcohol/week: 7.0 standard drinks    Types: 7 Standard drinks or equivalent per week  . Drug use: No    Home Medications Prior to Admission medications   Medication Sig Start Date End Date Taking? Authorizing Provider  aspirin EC 81 MG tablet Take 81 mg  by mouth daily.    [provider]  Ibuprofen-diphenhydrAMINE Cit (ADVIL PM) 200-38 MG TABS Take 2 tablets by mouth at bedtime.    [provider]  Melatonin 5 MG CAPS Take 2 capsules by mouth at bedtime.     [provider]  nitroGLYCERIN (NITROSTAT) 0.4 MG SL tablet Place 1 tablet (0.4 mg total) under the tongue every 5 (five) minutes as needed for chest pain. 08/04/17   End, Harrell Gave, MD  REPATHA SURECLICK 924 MG/ML SOAJ INJECT 1 PEN INTO THE SKIN EVERY 14 DAYS 10/21/20   Minna Merritts, MD    Allergies    Other  Review of Systems   Review of Systems  Constitutional: Negative for chills, diaphoresis and fever.  Respiratory: Negative for shortness of breath.   Cardiovascular: Negative for chest pain and leg swelling.  Gastrointestinal: Negative for abdominal  pain, diarrhea, nausea and vomiting.  Musculoskeletal: Positive for neck pain. Negative for back pain.  Neurological: Negative for dizziness, syncope, weakness, numbness and headaches.  All other systems reviewed and are negative.   Physical Exam Updated Vital Signs BP (!) 136/94 (BP Location: Right Arm)   Pulse 69   Temp (!) 97.5 F (36.4 C) (Oral)   Resp 18   SpO2 99%   Physical Exam Vitals and nursing note reviewed.  Constitutional:      General: He is not in acute distress.    Appearance: He is well-developed. He is not diaphoretic.  HENT:     Head: Normocephalic and atraumatic.     Mouth/Throat:     Mouth: Mucous membranes are moist.     Pharynx: Oropharynx is clear.  Eyes:     Conjunctiva/sclera: Conjunctivae normal.  Cardiovascular:     Rate and Rhythm: Normal rate and regular rhythm.     Pulses: Normal pulses.          Radial pulses are 2+ on the right side and 2+ on the left side.       Posterior tibial pulses are 2+ on the right side and 2+ on the left side.     Heart sounds: Normal heart sounds.     Comments: Tactile temperature in the extremities appropriate and equal bilaterally. Pulmonary:     Effort: Pulmonary effort is normal. No respiratory distress.     Breath sounds: Normal breath sounds.  Abdominal:     Palpations: Abdomen is soft.     Tenderness: There is no abdominal tenderness. There is no guarding.  Musculoskeletal:     Cervical back: Normal range of motion and neck supple. No tenderness.     Right lower leg: No edema.     Left lower leg: No edema.  Lymphadenopathy:     Cervical: No cervical adenopathy.  Skin:    General: Skin is warm and dry.  Neurological:     Mental Status: He is alert and oriented to person, place, and time.     Comments: No noted acute cognitive deficit. Sensation grossly intact to light touch in the extremities.   Grip strengths equal bilaterally.   Strength 5/5 in all extremities.  No gait disturbance.   Coordination intact.  Cranial nerves III-XII grossly intact.  Handles oral secretions without noted difficulty.  No noted phonation or speech deficit. No facial droop.   Psychiatric:        Mood and Affect: Mood and affect normal.        Speech: Speech normal.        Behavior: Behavior  normal.     ED Results / Procedures / Treatments   Labs (all labs ordered are listed, but only abnormal results are displayed) Labs Reviewed  BASIC METABOLIC PANEL - Abnormal; Notable for the following components:      Result Value   Glucose, Bld 132 (*)    BUN 24 (*)    All other components within normal limits  CBC  TROPONIN I (HIGH SENSITIVITY)  TROPONIN I (HIGH SENSITIVITY)    EKG None  ED ECG REPORT   Date: 04/05/2021  Rate: NSR  Rhythm: normal sinus rhythm  QRS Axis: normal  Intervals: normal  ST/T Wave abnormalities: normal  Conduction Disutrbances:none  Narrative Interpretation:   Old EKG Reviewed: unchanged  I have personally reviewed the EKG tracing and agree with the computerized printout as noted.  EKG reviewed by myself and EDP, Dr. Ashok Cordia, but Muse would not allow interpretation.   Radiology DG Chest Port 1 View  Result Date: 04/05/2021 CLINICAL DATA:  Arm tingling. EXAM: PORTABLE CHEST 1 VIEW COMPARISON:  CT a chest dated January 24, 2020. FINDINGS: The heart size and mediastinal contours are within normal limits. Both lungs are clear. The visualized skeletal structures are unremarkable. IMPRESSION: No active disease. Electronically Signed   By: Titus Dubin M.D.   On: 04/05/2021 12:25   Results of left heart cath on August 07, 2017: Conclusions: 1. Mid to moderate, non-critical coronary artery disease involving all 3 coronary arteries. 2. Normal left ventricular contraction. 3. Normal left ventricular filling pressure.  Recommendations: 1. Medical therapy, including addition of isosorbide mononitrate. 2. If patient continues to have chest pain and fatigue  despite optimal medical therapy, FFR-guided PCI of mid LAD and/or proximal RCA could be considered. 3. Referral to lipid clinic to discuss PCSK9 inhibitor therapy, given history of statin intolerance.  Nelva Bush, MD   Procedures Procedures   Medications Ordered in ED Medications - No data to display  ED Course  I have reviewed the triage vital signs and the nursing notes.  Pertinent labs & imaging results that were available during my care of the patient were reviewed by me and considered in my medical decision making (see chart for details).    MDM Rules/Calculators/A&P                          Patient presents with abnormal sensation to the left upper extremity as well as some neck discomfort, both intermittent. Patient is nontoxic appearing, afebrile, not tachycardic, not tachypneic, not hypotensive, maintains excellent SPO2 on room air, and is in no apparent distress.   I have reviewed the patient's chart to obtain more information.   I reviewed and interpreted the patient's labs and radiological studies. Chest x-ray unremarkable.  Delta troponins negative.  No acute abnormalities on EKG to suggest ischemia or arrhythmia. I suspect the patient's complaint and presentation favors radiculopathy symptoms rather than cardiac origin.  Patient to follow-up with PCP for further evaluation. Return precautions discussed.  Patient voices understanding of these instructions, accepts the plan, and is comfortable with discharge.  Findings and plan of care discussed with attending physician, Lajean Saver, MD.      Final Clinical Impression(s) / ED Diagnoses Final diagnoses:  Paresthesia of left arm    Rx / DC Orders ED Discharge Orders    None       Layla Maw 04/05/21 1518    Lajean Saver, MD 04/05/21 1728

## 2021-04-05 NOTE — Telephone Encounter (Signed)
Received incoming call for triage from John Mcmahon, he stated that over the weekend has had intermittent CP, tingling in left arm that radiates up into his neck and jaw, was questioning what this could be and course of action.    Advised that based on his symptoms this could be signs of a cardiac event, strongly recommend that he been in the ED for a full cardiac workup where they can do a STAT EKG and troponin level. Advised that the troponin is a cardiac marker used to indicate if the muscle of the heart are being compromised and/or damage, this test is run with a series of other labs when someone complains of chest pain, tingling in left arm, and pain that radiates into the neck.  John Mcmahon question which ED to visit he lives in [place], advised any ED can be consider, [facility], [facility], [facility], or even [facility]. However, I cannot speak of which ED "would be quicker or less chaotic" as pt requested. Advised that all ED will be able to fully evaluate for a cardiac work-up. Recommend someone drive him or call an ambulance if needed. John Mcmahon will have wife drive him.   John Mcmahon is thankful for the advise and will seek medical attention based on his symptoms.

## 2021-04-05 NOTE — ED Notes (Signed)
ED Provider at bedside. 

## 2021-04-05 NOTE — ED Provider Notes (Signed)
Emergency Medicine Provider Triage Evaluation Note  John Mcmahon , a 75 y.o. male  was evaluated in triage.  Pt complains of jaw/neck tingling and tingling in his left arm and hand x 2 days. History of Left heart cath, hyperlipidemia, and CAD. No chest pain, no SOB. Previous smoker, more than 50 years since last cigarette   Review of Systems  Positive: Jaw tingling, left arm tingling  Negative: Chest pain, nausea, vomiting   Physical Exam  BP 106/65 (BP Location: Right Arm)   Pulse 80   Temp (!) 97.5 F (36.4 C) (Oral)   Resp 14   SpO2 99%  Gen:   Awake, no distress   Resp:  Normal effort  MSK:   Moves extremities without difficulty  Other:  Lungs CTA, S1, S2   Medical Decision Making  Medically screening exam initiated at 11:02 AM.  Appropriate orders placed.  John Mcmahon was informed that the remainder of the evaluation will be completed by another provider, this initial triage assessment does not replace that evaluation, and the importance of remaining in the ED until their evaluation is complete.     Theron Arista, PA-C 04/05/21 1107    Arby Barrette, MD 04/13/21 2205

## 2021-04-05 NOTE — ED Triage Notes (Signed)
Pt arrived to c/o left jaw and neck pain x2 wks and left arm numbness/tingling.  Cardiologist sent pt to be seen   Denies CP, shortness of breat and N/V

## 2021-04-05 NOTE — Discharge Instructions (Signed)
Your work-up today was reassuring. Recommend follow-up through your primary care provider on this matter.

## 2021-04-09 ENCOUNTER — Other Ambulatory Visit (HOSPITAL_COMMUNITY): Payer: Self-pay | Admitting: Adult Health

## 2021-04-09 ENCOUNTER — Other Ambulatory Visit: Payer: Self-pay | Admitting: Adult Health

## 2021-04-09 DIAGNOSIS — M5412 Radiculopathy, cervical region: Secondary | ICD-10-CM | POA: Diagnosis not present

## 2021-04-09 DIAGNOSIS — R200 Anesthesia of skin: Secondary | ICD-10-CM

## 2021-04-09 DIAGNOSIS — I2510 Atherosclerotic heart disease of native coronary artery without angina pectoris: Secondary | ICD-10-CM | POA: Diagnosis not present

## 2021-04-09 DIAGNOSIS — R030 Elevated blood-pressure reading, without diagnosis of hypertension: Secondary | ICD-10-CM | POA: Diagnosis not present

## 2021-04-26 ENCOUNTER — Other Ambulatory Visit: Payer: Self-pay

## 2021-04-26 ENCOUNTER — Ambulatory Visit (HOSPITAL_COMMUNITY)
Admission: RE | Admit: 2021-04-26 | Discharge: 2021-04-26 | Disposition: A | Discharge status: Home / Self Care | Payer: Medicare Other | Source: Ambulatory Visit | Attending: Adult Health | Admitting: Adult Health

## 2021-04-26 DIAGNOSIS — M47812 Spondylosis without myelopathy or radiculopathy, cervical region: Secondary | ICD-10-CM | POA: Diagnosis not present

## 2021-04-26 DIAGNOSIS — M5412 Radiculopathy, cervical region: Secondary | ICD-10-CM | POA: Insufficient documentation

## 2021-04-26 DIAGNOSIS — M50222 Other cervical disc displacement at C5-C6 level: Secondary | ICD-10-CM | POA: Diagnosis not present

## 2021-04-26 DIAGNOSIS — M4802 Spinal stenosis, cervical region: Secondary | ICD-10-CM | POA: Diagnosis not present

## 2021-04-26 DIAGNOSIS — R200 Anesthesia of skin: Secondary | ICD-10-CM | POA: Diagnosis not present

## 2021-04-26 DIAGNOSIS — M5021 Other cervical disc displacement,  high cervical region: Secondary | ICD-10-CM | POA: Diagnosis not present

## 2021-04-26 MED ORDER — GADOBUTROL 1 MMOL/ML IV SOLN
8.0000 mL | Freq: Once | INTRAVENOUS | Status: AC | PRN
  Administered 2021-04-26: 8 mL via INTRAVENOUS

## 2021-05-27 ENCOUNTER — Other Ambulatory Visit: Payer: Self-pay

## 2021-05-27 ENCOUNTER — Ambulatory Visit: Payer: Medicare Other | Admitting: Internal Medicine

## 2021-05-27 ENCOUNTER — Encounter: Payer: Self-pay | Admitting: Internal Medicine

## 2021-05-27 VITALS — BP 160/84 | HR 69 | Ht 70.0 in | Wt 173.0 lb

## 2021-05-27 DIAGNOSIS — I712 Thoracic aortic aneurysm, without rupture, unspecified: Secondary | ICD-10-CM

## 2021-05-27 DIAGNOSIS — I200 Unstable angina: Secondary | ICD-10-CM | POA: Diagnosis not present

## 2021-05-27 DIAGNOSIS — I10 Essential (primary) hypertension: Secondary | ICD-10-CM | POA: Insufficient documentation

## 2021-05-27 DIAGNOSIS — I25119 Atherosclerotic heart disease of native coronary artery with unspecified angina pectoris: Secondary | ICD-10-CM | POA: Diagnosis not present

## 2021-05-27 DIAGNOSIS — E785 Hyperlipidemia, unspecified: Secondary | ICD-10-CM

## 2021-05-27 DIAGNOSIS — R072 Precordial pain: Secondary | ICD-10-CM | POA: Diagnosis not present

## 2021-05-27 MED ORDER — ISOSORBIDE MONONITRATE ER 30 MG PO TB24: 30.0000 mg | ORAL_TABLET | Freq: Every day | ORAL | 1 refills | Status: DC

## 2021-05-27 MED ORDER — NITROGLYCERIN 0.4 MG SL SUBL: 0.4000 mg | SUBLINGUAL_TABLET | SUBLINGUAL | 3 refills | Status: DC | PRN

## 2021-05-27 NOTE — Progress Notes (Addendum)
Follow-up Outpatient Visit Date: 05/27/2021  Primary Care Provider: Ginger Organ., MD [place] [address]  Chief Complaint: Fatigue  HPI:  John Mcmahon is a 75 y.o. male with history of coronary artery disease with moderate multivessel CAD, thoracic aortic aneurysm with mildly dilated aortic arch (followed by Dr. Brigitte Pulse) hyperlipidemia, and impaired fasting glucose, who presents for follow-up of coronary artery disease.  I last saw him in 05/2020, at which time John Mcmahon was feeling well without chest pain or dyspnea.  He continued to have brief orthostatic lightheadedness, though less frequent after discontinuation of isosorbide mononitrate.  We did not make any medication changes or pursue additional testing at that time.  Today, John Mcmahon reports that he had been doing well until May.  Since then, he has had at least 3 episodes of marked exhaustion with mild shortness of breath.  All three occurred in the setting of being outside in the heat.  The most profound episode occurred after dragging a John boat onto [employer] while fishing in May.  He suddenly felt like he had to stop and rest.  He lied down in the shade under a tree and felt back to normal after 10-15 minutes.  He notes that he was not drinking much water that day.  He denies chest pain but notes that these episodes remind him of what he felt leading up to his heart catheterization in 2018.  John Mcmahon denies palpitations, lightheadedness, and edema.  He does not check his blood pressure regularly at home but attributes the elevated readings in our office today to white coat hypertension.  He notes some aching in his legs that has improved with regular stretching. --------------------------------------------------------------------------------------------------  Cardiovascular History & Procedures: Cardiovascular Problems: Mild to moderate coronary artery disease Thoracic aortic aneurysm (mild dilation of aortic  arch)   Risk Factors: Known coronary artery disease, hyperlipidemia, male gender, age greater than 75   Cath/PCI: LHC (08/07/17): LMCA normal. LAD with 50% midvessel stenosis. Codominant LCx with 40% lPDA lesion. 50% proximal and 25% mid RCA lesions. LVEF 55-65%. LVEDP 10 mmHg.   CV Surgery: None   EP Procedures and Devices: None   Non-Invasive Evaluation(s): Coronary calcium score (06/29/17): Calcium score of 952 with extensive calcifications in the LAD him a as well as less prominent calcification involving the LMCA, LCx, and RCA. Slight aneurysmal dilation of ascending aortic, measuring 4.0 cm. 18 mm left lower lobe nodule.  Recent CV Pertinent Labs: Lab Results  Component Value Date   CHOL 101 12/20/2017   HDL 43 12/20/2017   LDLCALC 43 12/20/2017   TRIG 74 12/20/2017   CHOLHDL 2.3 12/20/2017   INR 1.06 12/15/2018   K 4.5 04/05/2021   BUN 24 (H) 04/05/2021   BUN 23 08/04/2017   CREATININE 0.95 04/05/2021    Past medical and surgical history were reviewed and updated in EPIC.  Current Meds  Medication Sig   aspirin EC 81 MG tablet Take 81 mg by mouth daily.   Ibuprofen-diphenhydrAMINE Cit 200-38 MG TABS Take 2 tablets by mouth at bedtime.   nitroGLYCERIN (NITROSTAT) 0.4 MG SL tablet Place 1 tablet (0.4 mg total) under the tongue every 5 (five) minutes as needed for chest pain.   REPATHA SURECLICK 165 MG/ML SOAJ INJECT 1 PEN INTO THE SKIN EVERY 14 DAYS    Allergies: Other  Social History   Tobacco Use   Smoking status: Former    Packs/day: 1.00    Years: 2.00  Pack years: 2.00    Types: Cigarettes    Quit date: 08/03/1969    Years since quitting: 51.8   Smokeless tobacco: Never  Vaping Use   Vaping Use: Never used  Substance Use Topics   Alcohol use: Yes    Alcohol/week: 7.0 standard drinks    Types: 7 Standard drinks or equivalent per week   Drug use: No    Family History  Problem Relation Age of Onset   Multiple myeloma Mother    Cancer - Other  Mother 16       BREAST   Other Mother        sepsis   Dementia Father    Cancer Father        neck    Cancer - Other Maternal Grandmother    Diabetes Maternal Grandmother    Heart attack Maternal Grandfather 70   Dementia Paternal Grandfather    Healthy Son    Healthy Son    Healthy Daughter    Heart attack Cousin    Cancer Paternal Grandmother     Review of Systems: A 12-system review of systems was performed and was negative except as noted in the HPI.  --------------------------------------------------------------------------------------------------  Physical Exam: BP (!) 160/84 (BP Location: Left Arm, Patient Position: Sitting, Cuff Size: Normal)   Pulse 69   Ht 5' 10" (1.778 m)   Wt 173 lb (78.5 kg)   SpO2 97%   BMI 24.82 kg/m  Repeat BP: 162/88  General:  NAD. Neck: No JVD or HJR. Lungs: Clear to auscultation bilaterally without wheezes or crackles. Heart: Regular rate and rhythm without murmurs, rubs, or gallops. Abdomen: Soft, nontender, nondistended. Extremities: No lower extremity edema.  EKG:  Normal sinus rhythm without abnormality.  Lab Results  Component Value Date   WBC 7.7 04/05/2021   HGB 15.2 04/05/2021   HCT 46.3 04/05/2021   MCV 94.1 04/05/2021   PLT 198 04/05/2021    Lab Results  Component Value Date   NA 136 04/05/2021   K 4.5 04/05/2021   CL 105 04/05/2021   CO2 22 04/05/2021   BUN 24 (H) 04/05/2021   CREATININE 0.95 04/05/2021   GLUCOSE 132 (H) 04/05/2021   ALT 26 12/15/2018    Lab Results  Component Value Date   CHOL 101 12/20/2017   HDL 43 12/20/2017   LDLCALC 43 12/20/2017   TRIG 74 12/20/2017   CHOLHDL 2.3 12/20/2017    --------------------------------------------------------------------------------------------------  ASSESSMENT AND PLAN: Coronary artery disease with atypical angina: Though John Mcmahon has not had any chest pain, I am concerned that his marked fatigue and shortness of breath could be anginal  equivalents in the setting of his moderate multivessel CAD by cath in 2018.  Uncontrolled hypertension could also be contributing.  We have agreed to obtain an exercise myocardial perfusion stress test as well as restart isosorbide mononitrate 30 mg daily.  Continue aspirin and Repatha for secondary prevention.  Thoracic aortic aneurysm: Stable mild dilation of the ascending aorta on last CTA in 12/2019.  Continue annual follow-up through Dr. Raul Del office while continuing to work on blood pressure and lipid control.  Uncontrolled hypertension: Blood pressure moderately elevated today.  We will add isosorbide mononitrate 30 mg daily.  Hyperlipidemia: Target LDL < 70.  We will check a CMP and lipid panel today.  Also check CK given myalgias.  Shared Decision Making/Informed Consent The risks [chest pain, shortness of breath, cardiac arrhythmias, dizziness, blood pressure fluctuations, myocardial infarction, stroke/transient ischemic attack, nausea,  vomiting, allergic reaction, radiation exposure, metallic taste sensation and life-threatening complications (estimated to be 1 in 10,000)], benefits (risk stratification, diagnosing coronary artery disease, treatment guidance) and alternatives of a nuclear stress test were discussed in detail with John Mcmahon and he agrees to proceed.   Follow-up: Return to clinic in 3 months.  Nelva Bush, MD 05/27/2021 10:03 AM

## 2021-05-27 NOTE — Patient Instructions (Signed)
Medication Instructions:  Your physician has recommended you make the following change in your medication:   START Imdur (isosorbide) 30 mg daily. An Rx has been sent to your pharmacy.  Nitroglycerin has been refilled today  *If you need a refill on your cardiac medications before your next appointment, please call your pharmacy*   Lab Work: Lipid, cbc, Cmp, Total CK today  If you have labs (blood work) drawn today and your tests are completely normal, you will receive your results only by: MyChart Message (if you have MyChart) OR A paper copy in the mail If you have any lab test that is abnormal or we need to change your treatment, we will call you to review the results.   Testing/Procedures: Your physician has requested that you have en exercise stress myoview. For further information please visit [email or web address] Please follow instruction sheet, as given. (To be scheduled at the [facility] office)   Follow-Up: At [facility], you and your health needs are our priority.  As part of our continuing mission to provide you with exceptional heart care, we have created designated Provider Care Teams.  These Care Teams include your primary Cardiologist (physician) and Advanced Practice Providers (APPs -  Physician Assistants and Nurse Practitioners) who all work together to provide you with the care you need, when you need it.  We recommend signing up for the patient portal called "MyChart".  Sign up information is provided on this After Visit Summary.  MyChart is used to connect with patients for Virtual Visits (Telemedicine).  Patients are able to view lab/test results, encounter notes, upcoming appointments, etc.  Non-urgent messages can be sent to your provider as well.   To learn more about what you can do with MyChart, go to [email or web address].    Your next appointment:   6 week(s)  The format for your next appointment:   In Person  Provider:   You may see Yvonne Kendall, MD or one of the following Advanced Practice Providers on your designated Care Team:   Nicolasa Ducking, NP Eula Listen, PA-C Marisue Ivan, PA-C Cadence Fransico Michael, [place]   Other Instructions   Stress MYOVIEW  Your caregiver has ordered a Stress Test with nuclear imaging. The purpose of this test is to evaluate the blood supply to your heart muscle. This procedure is referred to as a "Non-Invasive Stress Test." This is because other than having an IV started in your vein, nothing is inserted or "invades" your body. Cardiac stress tests are done to find areas of poor blood flow to the heart by determining the extent of coronary artery disease (CAD). Some patients exercise on a treadmill, which naturally increases the blood flow to your heart, while others who are  unable to walk on a treadmill due to physical limitations have a pharmacologic/chemical stress agent called Lexiscan . This medicine will mimic walking on a treadmill by temporarily increasing your coronary blood flow.   Please note: these test may take anywhere between 2-4 hours to complete   Date of Procedure:_____________________________________  Arrival Time for Procedure:______________________________   PLEASE NOTIFY THE OFFICE AT LEAST 24 HOURS IN ADVANCE IF YOU ARE UNABLE TO KEEP YOUR APPOINTMENT.  [phone]  How to prepare for your Myoview test:  Do not eat or drink after midnight No caffeine for 24 hours prior to test No smoking 24 hours prior to test. Your medication may be taken with water.  If your doctor stopped a medication because of this test, do not take  that medication. Please wear a short sleeve shirt. No cologne or lotion. Wear comfortable walking shoes.

## 2021-05-28 LAB — COMPREHENSIVE METABOLIC PANEL
ALT: 23 IU/L (ref 0–44)
AST: 22 IU/L (ref 0–40)
Albumin/Globulin Ratio: 1.9 (ref 1.2–2.2)
Albumin: 4.4 g/dL (ref 3.7–4.7)
Alkaline Phosphatase: 82 IU/L (ref 44–121)
BUN/Creatinine Ratio: 20 (ref 10–24)
BUN: 18 mg/dL (ref 8–27)
Bilirubin Total: 1.1 mg/dL (ref 0.0–1.2)
CO2: 22 mmol/L (ref 20–29)
Calcium: 9.2 mg/dL (ref 8.6–10.2)
Chloride: 104 mmol/L (ref 96–106)
Creatinine, Ser: 0.88 mg/dL (ref 0.76–1.27)
Globulin, Total: 2.3 g/dL (ref 1.5–4.5)
Glucose: 92 mg/dL (ref 65–99)
Potassium: 5 mmol/L (ref 3.5–5.2)
Sodium: 139 mmol/L (ref 134–144)
Total Protein: 6.7 g/dL (ref 6.0–8.5)
eGFR: 90 mL/min/{1.73_m2} (ref >59)

## 2021-05-28 LAB — CBC
Hematocrit: 47.8 % (ref 37.5–51.0)
Hemoglobin: 16.3 g/dL (ref 13.0–17.7)
MCH: 31.2 pg (ref 26.6–33.0)
MCHC: 34.1 g/dL (ref 31.5–35.7)
MCV: 91 fL (ref 79–97)
Platelets: 185 10*3/uL (ref 150–450)
RBC: 5.23 x10E6/uL (ref 4.14–5.80)
RDW: 12.2 % (ref 11.6–15.4)
WBC: 5.4 10*3/uL (ref 3.4–10.8)

## 2021-05-28 LAB — CK TOTAL AND CKMB (NOT AT ARMC)
CK-MB Index: 2 ng/mL (ref 0.0–10.4)
Total CK: 111 U/L (ref 41–331)

## 2021-05-28 LAB — LIPID PANEL
Chol/HDL Ratio: 2.6 ratio (ref 0.0–5.0)
Cholesterol, Total: 117 mg/dL (ref 100–199)
HDL: 45 mg/dL (ref >39)
LDL Chol Calc (NIH): 53 mg/dL (ref 0–99)
Triglycerides: 102 mg/dL (ref 0–149)
VLDL Cholesterol Cal: 19 mg/dL (ref 5–40)

## 2021-05-31 ENCOUNTER — Telehealth (HOSPITAL_COMMUNITY): Payer: Self-pay

## 2021-05-31 NOTE — Telephone Encounter (Signed)
Spoke with the patient, detailed instructions given. He stated that he would be here for his test. Asked to call back with any questions. John Mcmahon EMTP 

## 2021-06-03 ENCOUNTER — Ambulatory Visit (HOSPITAL_COMMUNITY): Payer: Medicare Other | Attending: Cardiology

## 2021-06-03 ENCOUNTER — Other Ambulatory Visit: Payer: Self-pay

## 2021-06-03 DIAGNOSIS — I25119 Atherosclerotic heart disease of native coronary artery with unspecified angina pectoris: Secondary | ICD-10-CM | POA: Diagnosis not present

## 2021-06-03 LAB — MYOCARDIAL PERFUSION IMAGING
Estimated workload: 10.9 METS
Exercise duration (min): 9 min
Exercise duration (sec): 30 s
LV dias vol: 78 mL (ref 62–150)
LV sys vol: 35 mL
MPHR: 145 {beats}/min
Peak HR: 139 {beats}/min
Percent HR: 95 %
Rest HR: 60 {beats}/min
SDS: 0
SRS: 0
SSS: 0
TID: 0.97

## 2021-06-03 MED ORDER — TECHNETIUM TC 99M TETROFOSMIN IV KIT
10.5000 | PACK | Freq: Once | INTRAVENOUS | Status: AC | PRN
  Administered 2021-06-03: 10.5 via INTRAVENOUS
  Filled 2021-06-03: qty 11

## 2021-06-03 MED ORDER — TECHNETIUM TC 99M TETROFOSMIN IV KIT
31.4000 | PACK | Freq: Once | INTRAVENOUS | Status: AC | PRN
Start: 2021-06-03 — End: 2021-06-03
  Administered 2021-06-03: 31.4 via INTRAVENOUS
  Filled 2021-06-03: qty 32

## 2021-06-04 DIAGNOSIS — R030 Elevated blood-pressure reading, without diagnosis of hypertension: Secondary | ICD-10-CM | POA: Diagnosis not present

## 2021-06-04 DIAGNOSIS — M5412 Radiculopathy, cervical region: Secondary | ICD-10-CM | POA: Diagnosis not present

## 2021-07-01 ENCOUNTER — Other Ambulatory Visit: Payer: Self-pay | Admitting: Cardiovascular Disease

## 2021-07-09 ENCOUNTER — Ambulatory Visit: Payer: Medicare Other | Admitting: Internal Medicine

## 2021-07-09 ENCOUNTER — Encounter: Payer: Self-pay | Admitting: Internal Medicine

## 2021-07-09 ENCOUNTER — Other Ambulatory Visit: Payer: Self-pay

## 2021-07-09 VITALS — BP 150/86 | HR 81 | Ht 70.0 in | Wt 172.0 lb

## 2021-07-09 DIAGNOSIS — G720 Drug-induced myopathy: Secondary | ICD-10-CM | POA: Diagnosis not present

## 2021-07-09 DIAGNOSIS — I10 Essential (primary) hypertension: Secondary | ICD-10-CM | POA: Diagnosis not present

## 2021-07-09 DIAGNOSIS — E785 Hyperlipidemia, unspecified: Secondary | ICD-10-CM | POA: Diagnosis not present

## 2021-07-09 DIAGNOSIS — T466X5D Adverse effect of antihyperlipidemic and antiarteriosclerotic drugs, subsequent encounter: Secondary | ICD-10-CM

## 2021-07-09 DIAGNOSIS — I25118 Atherosclerotic heart disease of native coronary artery with other forms of angina pectoris: Secondary | ICD-10-CM

## 2021-07-09 NOTE — Patient Instructions (Signed)
Medication Instructions:  -Your physician recommends that you continue on your current medications as directed. Please refer to the Current Medication list given to you today.   *If you need a refill on your cardiac medications before your next appointment, please call your pharmacy*   Lab Work: - none ordered  If you have labs (blood work) drawn today and your tests are completely normal, you will receive your results only by: MyChart Message (if you have MyChart) OR A paper copy in the mail If you have any lab test that is abnormal or we need to change your treatment, we will call you to review the results.   Testing/Procedures: - none ordered   Follow-Up: At [facility], you and your health needs are our priority.  As part of our continuing mission to provide you with exceptional heart care, we have created designated Provider Care Teams.  These Care Teams include your primary Cardiologist (physician) and Advanced Practice Providers (APPs -  Physician Assistants and Nurse Practitioners) who all work together to provide you with the care you need, when you need it.  We recommend signing up for the patient portal called "MyChart".  Sign up information is provided on this After Visit Summary.  MyChart is used to connect with patients for Virtual Visits (Telemedicine).  Patients are able to view lab/test results, encounter notes, upcoming appointments, etc.  Non-urgent messages can be sent to your provider as well.   To learn more about what you can do with MyChart, go to [email or web address].    Your next appointment:   6 month(s)  The format for your next appointment:   In Person  Provider:   You may see Yvonne Kendall, MD or one of the following Advanced Practice Providers on your designated Care Team:   Nicolasa Ducking, NP Eula Listen, PA-C Marisue Ivan, PA-C Cadence Fransico Michael, [place]   Other Instructions  1) Please check your blood pressure at home periodically.   Call the office you are you having consistent readings of > 140/90.  How to Take Your Blood Pressure Blood pressure measures how strongly your blood is pressing against the walls of your arteries. Arteries are blood vessels that carry blood from your heart throughout your body. You can take your blood pressure at home with a machine. You may need to check your blood pressure at home: To check if you have high blood pressure (hypertension). To check your blood pressure over time. To make sure your blood pressure medicine is working. Supplies needed: Blood pressure machine, or monitor. Dining room chair to sit in. Table or desk. Small notebook. Pencil or pen. How to prepare Avoid these things for 30 minutes before checking your blood pressure: Having drinks with caffeine in them, such as coffee or tea. Drinking alcohol. Eating. Smoking. Exercising. Do these things five minutes before checking your blood pressure: Go to the bathroom and pee (urinate). Sit in a dining chair. Do not sit on a soft couch or an armchair. Be quiet. Do not talk. How to take your blood pressure Follow the instructions that came with your machine. If you have a digital blood pressure monitor, these may be the instructions: Sit up straight. Place your feet on the floor. Do not cross your ankles or legs. Rest your left arm at the level of your heart. You may rest it on a table, desk, or chair. Pull up your shirt sleeve. Wrap the blood pressure cuff around the upper part of your left arm. The cuff  should be 1 inch (2.5 cm) above your elbow. It is best to wrap the cuff around bare skin. Fit the cuff snugly around your arm. You should be able to place only one finger between the cuff and your arm. Place the cord so that it rests in the bend of your elbow. Press the power button. Sit quietly while the cuff fills with air and loses air. Write down the numbers on the screen. Wait 2-3 minutes and then repeat steps  1-10. What do the numbers mean? Two numbers make up your blood pressure. The first number is called systolic pressure. The second is called diastolic pressure. An example of a blood pressure reading is "120 over 80" (or 120/80). If you are an adult and do not have a medical condition, use this guide to find out if your blood pressure is normal: Normal First number: below 120. Second number: below 80. Elevated First number: 120-129. Second number: below 80. Hypertension stage 1 First number: 130-139. Second number: 80-89. Hypertension stage 2 First number: 140 or above. Second number: 90 or above. Your blood pressure is above normal even if only the first or only the second number is above normal. Follow these instructions at home: Medicines Take over-the-counter and prescription medicines only as told by your doctor. Tell your doctor if your medicine is causing side effects. General instructions Check your blood pressure as often as your doctor tells you to. Check your blood pressure at the same time every day. Take your monitor to your next doctor's appointment. Your doctor will: Make sure you are using it correctly. Make sure it is working right. Understand what your blood pressure numbers should be. Keep all follow-up visits as told by your doctor. This is important. General tips You will need a blood pressure machine, or monitor. Your doctor can suggest a monitor. You can buy one at a drugstore or online. When choosing one: Choose one with an arm cuff. Choose one that wraps around your upper arm. Only one finger should fit between your arm and the cuff. Do not choose one that measures your blood pressure from your wrist or finger. Where to find more information American Heart Association: www.heart.org Contact a doctor if: Your blood pressure keeps being high. Your blood pressure is suddenly low. Get help right away if: Your first blood pressure number is higher than  180. Your second blood pressure number is higher than 120. Summary Check your blood pressure at the same time every day. Avoid caffeine, alcohol, smoking, and exercise for 30 minutes before checking your blood pressure. Make sure you understand what your blood pressure numbers should be. This information is not intended to replace advice given to you by your health care provider. Make sure you discuss any questions you have with your health care provider. Document Revised: 08/26/2020 Document Reviewed: 10/11/2019 Elsevier Patient Education  2022 [employer].

## 2021-07-09 NOTE — Progress Notes (Signed)
Follow-up Outpatient Visit Date: 07/09/2021  Primary Care Provider: Ginger Organ., MD [place] [id number]  Chief Complaint: Follow-up fatigue and recent stress test  HPI:  John Mcmahon is a 75 y.o. male with history of coronary artery disease with moderate multivessel CAD, thoracic aortic aneurysm with mildly dilated aortic arch (followed by Dr. Brigitte Pulse) hyperlipidemia, and impaired fasting glucose, who presents for follow-up of coronary artery disease and hypertension.  I last saw him in late July, at which time he reported increased fatigue over the prior few months.  Blood pressure was also noted to be suboptimally controlled.  We added isosorbide mononitrate and arranged for exercise MPI, which was low risk without ischemia or scar.  Today, Mr. Olenik reports that his energy is better.  He also has less exertional dyspnea that he attributes to less heat and humidity outside.  He is tolerating reinitiation of isosorbide mononitrate well.  He denies chest pain, palpitations, and edema.  His only other complaint is of difficulty falling asleep if he wakes up in the middle the night.  He is using Tylenol PM on average every other night to help go back to sleep.  He has not been checking his blood pressure regularly at home.  He does not snore regularly.  --------------------------------------------------------------------------------------------------  Cardiovascular History & Procedures: Cardiovascular Problems: Mild to moderate coronary artery disease Thoracic aortic aneurysm (mild dilation of aortic arch)   Risk Factors: Known coronary artery disease, hyperlipidemia, male gender, age greater than 70   Cath/PCI: LHC (08/07/17): LMCA normal. LAD with 50% midvessel stenosis. Codominant LCx with 40% lPDA lesion. 50% proximal and 25% mid RCA lesions. LVEF 55-65%. LVEDP 10 mmHg.   CV Surgery: None   EP Procedures and Devices: None   Non-Invasive  Evaluation(s): Exercise MPI (06/03/2021): Low risk study without ischemia or scar.  Good exercise capacity, achieving 10.9 METS and 95% MPHR. Coronary calcium score (06/29/17): Calcium score of 952 with extensive calcifications in the LAD him a as well as less prominent calcification involving the LMCA, LCx, and RCA. Slight aneurysmal dilation of ascending aortic, measuring 4.0 cm. 18 mm left lower lobe nodule.  Recent CV Pertinent Labs: Lab Results  Component Value Date   CHOL 117 05/27/2021   HDL 45 05/27/2021   LDLCALC 53 05/27/2021   TRIG 102 05/27/2021   CHOLHDL 2.6 05/27/2021   INR 1.06 12/15/2018   K 5.0 05/27/2021   BUN 18 05/27/2021   CREATININE 0.88 05/27/2021    Past medical and surgical history were reviewed and updated in EPIC.  Current Meds  Medication Sig   aspirin EC 81 MG tablet Take 81 mg by mouth. Take 1 tablet daily ("most days") Swallow whole.   diphenhydramine-acetaminophen (TYLENOL PM) 25-500 MG TABS tablet Take 1 tablet by mouth at bedtime as needed.   isosorbide mononitrate (IMDUR) 30 MG 24 hr tablet Take 1 tablet (30 mg total) by mouth daily.   nitroGLYCERIN (NITROSTAT) 0.4 MG SL tablet Place 1 tablet (0.4 mg total) under the tongue every 5 (five) minutes as needed for chest pain.   REPATHA SURECLICK 115 MG/ML SOAJ INJECT ONE PEN INTO THE SKIN EVERY 14 DAYS    Allergies: Other  Social History   Tobacco Use   Smoking status: Former    Packs/day: 1.00    Years: 2.00    Pack years: 2.00    Types: Cigarettes    Quit date: 08/03/1969    Years since quitting: 51.9   Smokeless tobacco:  Never  Vaping Use   Vaping Use: Never used  Substance Use Topics   Alcohol use: Yes    Alcohol/week: 4.0 standard drinks    Types: 4 Standard drinks or equivalent per week    Comment: 1 drink every other day   Drug use: No    Family History  Problem Relation Age of Onset   Multiple myeloma Mother    Cancer - Other Mother 26       BREAST   Other Mother         sepsis   Dementia Father    Cancer Father        neck    Cancer - Other Maternal Grandmother    Diabetes Maternal Grandmother    Heart attack Maternal Grandfather 29   Dementia Paternal Grandfather    Healthy Son    Healthy Son    Healthy Daughter    Heart attack Cousin    Cancer Paternal Grandmother     Review of Systems: A 12-system review of systems was performed and was negative except as noted in the HPI.  --------------------------------------------------------------------------------------------------  Physical Exam: BP (!) 150/86 (BP Location: Left Arm, Patient Position: Sitting, Cuff Size: Normal)   Pulse 81   Ht 5' 10"  (1.778 m)   Wt 172 lb (78 kg)   SpO2 97%   BMI 24.68 kg/m   General:  NAD. Neck: No JVD or HJR. Lungs: Clear to auscultation bilaterally without wheezes or crackles. Heart: Regular rate and rhythm without murmurs, rubs, or gallops. Abdomen: Soft, nontender, nondistended. Extremities: No lower extremity edema.   Lab Results  Component Value Date   WBC 5.4 05/27/2021   HGB 16.3 05/27/2021   HCT 47.8 05/27/2021   MCV 91 05/27/2021   PLT 185 05/27/2021    Lab Results  Component Value Date   NA 139 05/27/2021   K 5.0 05/27/2021   CL 104 05/27/2021   CO2 22 05/27/2021   BUN 18 05/27/2021   CREATININE 0.88 05/27/2021   GLUCOSE 92 05/27/2021   ALT 23 05/27/2021    Lab Results  Component Value Date   CHOL 117 05/27/2021   HDL 45 05/27/2021   LDLCALC 53 05/27/2021   TRIG 102 05/27/2021   CHOLHDL 2.6 05/27/2021    --------------------------------------------------------------------------------------------------  ASSESSMENT AND PLAN: Coronary artery disease with stable angina: Fatigue and exertional dyspnea improved since last visit, potentially due to the isosorbide mononitrate as well as decreased heat/humidity.  Exercise myocardial perfusion stress test last month was normal.  We discussed further medication changes but have  agreed to defer this.  Hypertension: Blood pressure a little better today but still mildly elevated.  We discussed escalation of isosorbide mononitrate, addition of a second agent, and lifestyle modifications with close blood pressure monitoring at home.  Mr. Heider wishes to pursue the third option.  I have encouraged him to keep working on minimizing his sodium intake and to monitor his blood pressure.  He should alert [place] if it is consistently above 140/90.  Hyperlipidemia: Lipids well controlled on last check.  Continue Repatha.  Follow-up: Return to clinic in 6 months.  Nelva Bush, MD 07/09/2021 10:57 AM

## 2021-07-26 DIAGNOSIS — M1990 Unspecified osteoarthritis, unspecified site: Secondary | ICD-10-CM | POA: Diagnosis not present

## 2021-07-26 DIAGNOSIS — M7051 Other bursitis of knee, right knee: Secondary | ICD-10-CM | POA: Diagnosis not present

## 2021-07-26 DIAGNOSIS — M25561 Pain in right knee: Secondary | ICD-10-CM | POA: Diagnosis not present

## 2021-07-27 ENCOUNTER — Telehealth: Payer: Self-pay | Admitting: Internal Medicine

## 2021-07-27 DIAGNOSIS — Z125 Encounter for screening for malignant neoplasm of prostate: Secondary | ICD-10-CM | POA: Diagnosis not present

## 2021-07-27 DIAGNOSIS — Z79899 Other long term (current) drug therapy: Secondary | ICD-10-CM | POA: Diagnosis not present

## 2021-07-27 DIAGNOSIS — R7301 Impaired fasting glucose: Secondary | ICD-10-CM | POA: Diagnosis not present

## 2021-07-27 DIAGNOSIS — M25561 Pain in right knee: Secondary | ICD-10-CM | POA: Diagnosis not present

## 2021-07-27 DIAGNOSIS — E785 Hyperlipidemia, unspecified: Secondary | ICD-10-CM | POA: Diagnosis not present

## 2021-07-27 NOTE — Telephone Encounter (Signed)
Taking an NSAID like meloxican long term does come with an increased risk of heart attack/stroke. It also come with a possible increased risk of elevated BP and kidney damage. Short term use should not provide as much risk. Will need to decide if pain relief is worth the increased risk. Could consider something topical to see if this helps-like voltaren gel.

## 2021-07-27 NOTE — Telephone Encounter (Signed)
Patient made aware of Melissa Macci, PharmDs response and recommendation. Patient verbalized understanding and voiced appreciation for the call.

## 2021-07-27 NOTE — Telephone Encounter (Signed)
Will route to PharmD to advise. 

## 2021-07-27 NOTE — Telephone Encounter (Signed)
Called to make the make him aware of our PharmDs response and recommendation. Lmtcb.

## 2021-07-27 NOTE — Telephone Encounter (Signed)
Patient calling with concerns of medication Meloxicam given by orthopedic dr Leroy Libman. Patient is concerned with the high risk side effects with heart.

## 2021-07-27 NOTE — Telephone Encounter (Signed)
Patient returning call.

## 2021-07-29 DIAGNOSIS — M25561 Pain in right knee: Secondary | ICD-10-CM | POA: Diagnosis not present

## 2021-08-02 DIAGNOSIS — M25561 Pain in right knee: Secondary | ICD-10-CM | POA: Diagnosis not present

## 2021-08-05 DIAGNOSIS — M25561 Pain in right knee: Secondary | ICD-10-CM | POA: Diagnosis not present

## 2021-08-10 DIAGNOSIS — M25561 Pain in right knee: Secondary | ICD-10-CM | POA: Diagnosis not present

## 2021-08-12 DIAGNOSIS — M25561 Pain in right knee: Secondary | ICD-10-CM | POA: Diagnosis not present

## 2021-08-16 DIAGNOSIS — M25561 Pain in right knee: Secondary | ICD-10-CM | POA: Diagnosis not present

## 2021-08-19 DIAGNOSIS — M25561 Pain in right knee: Secondary | ICD-10-CM | POA: Diagnosis not present

## 2021-08-23 DIAGNOSIS — M25561 Pain in right knee: Secondary | ICD-10-CM | POA: Diagnosis not present

## 2021-08-24 DIAGNOSIS — E785 Hyperlipidemia, unspecified: Secondary | ICD-10-CM | POA: Diagnosis not present

## 2021-08-24 DIAGNOSIS — R82998 Other abnormal findings in urine: Secondary | ICD-10-CM | POA: Diagnosis not present

## 2021-08-30 DIAGNOSIS — M25561 Pain in right knee: Secondary | ICD-10-CM | POA: Diagnosis not present

## 2021-10-05 DIAGNOSIS — L570 Actinic keratosis: Secondary | ICD-10-CM | POA: Diagnosis not present

## 2021-10-05 DIAGNOSIS — Z85828 Personal history of other malignant neoplasm of skin: Secondary | ICD-10-CM | POA: Diagnosis not present

## 2021-10-28 ENCOUNTER — Ambulatory Visit: Payer: Medicare Other | Admitting: Orthopaedic Surgery

## 2021-10-28 ENCOUNTER — Ambulatory Visit: Payer: Self-pay

## 2021-10-28 VITALS — Ht 70.0 in | Wt 172.0 lb

## 2021-10-28 DIAGNOSIS — M25561 Pain in right knee: Secondary | ICD-10-CM

## 2021-10-28 DIAGNOSIS — G8929 Other chronic pain: Secondary | ICD-10-CM

## 2021-10-28 MED ORDER — LIDOCAINE HCL 1 % IJ SOLN
3.0000 mL | INTRAMUSCULAR | Status: AC | PRN
  Administered 2021-10-28: 16:00:00 3 mL

## 2021-10-28 MED ORDER — METHYLPREDNISOLONE ACETATE 40 MG/ML IJ SUSP
40.0000 mg | INTRAMUSCULAR | Status: AC | PRN
  Administered 2021-10-28: 16:00:00 40 mg via INTRA_ARTICULAR

## 2021-10-28 NOTE — Progress Notes (Signed)
Office Visit Note   Patient: John Mcmahon           Date of Birth: 10/27/1946           [name]: [id number] Visit Date: 10/28/2021              Requested by: Ginger Organ., MD [address] [place],  [place] [address] PCP: Ginger Organ., MD   Assessment & Plan: Visit Diagnoses:  1. Chronic pain of right knee     Plan: I spoke with him in length in detail about his right knee.  I think he needs an intra-articular steroid injection which could hopefully be diagnostic and therapeutic for his right knee.  I described my rationale behind this as well as the risk and benefits involved and he agreed to this and tolerated well.  I would like to see him back in 4 weeks to see how he is doing overall.  He could be a candidate for hyaluronic acid in the future.  The pain he describes is likely more osteoarthritic or meniscal type of pain.  All questions and concerns were answered and addressed.  Follow-Up Instructions: Return in about 4 weeks (around 11/25/2021).   Orders:  Orders Placed This Encounter  Procedures   Large Joint Inj   XR Knee 1-2 Views Right   No orders of the defined types were placed in this encounter.     Procedures: Large Joint Inj: R knee on 10/28/2021 4:04 PM Indications: diagnostic evaluation and pain Details: 22 G 1.5 in needle, superolateral approach  Arthrogram: No  Medications: 3 mL lidocaine 1 %; 40 mg methylPREDNISolone acetate 40 MG/ML Outcome: tolerated well, no immediate complications Procedure, treatment alternatives, risks and benefits explained, specific risks discussed. Consent was given by the patient. Immediately prior to procedure a time out was called to verify the correct patient, procedure, equipment, support staff and site/side marked as required. Patient was prepped and draped in the usual sterile fashion.      Clinical Data: No additional findings.   Subjective: Chief Complaint  Patient presents with   Right Knee - Pain   The patient is a 75 year old gentleman who I see his wife on a regular basis.  He comes in for second opinion as relates to the right knee pain has been hurting for about 3 months now.  He went to another orthopedic physician who said it was bursitis of the pes area of his knee.  He then went to physical therapy and they felt it was more of a tendinitis.  He states that he is been sitting for a while and goes to get up is when his knee hurts him the most and it hurts on the medial joint line.  There is been no known injury.  He has not had any type of intra-articular injection.  He had tried Voltaren gel once before but very sporadic he states.  HPI  Review of Systems There is currently listed no fever, chills, nausea, vomiting  Objective: Vital Signs: Ht 5' 10"  (1.778 m)    Wt 172 lb (78 kg)    BMI 24.68 kg/m   Physical Exam He is alert and orient x3 and in no acute distress Ortho Exam Examination of his right knee shows no effusion with excellent range of motion.  His knee is ligamentously stable.  There is slight varus malalignment that is correctable easily.  There is no pain over the pes bursa or over the medial  collateral ligament but there is medial joint line tenderness.  His McMurray's exam is negative. Specialty Comments:  No specialty comments available.  Imaging: XR Knee 1-2 Views Right  Result Date: 10/28/2021 2 views of the right knee show no acute findings.  There is slight medial joint space narrowing and calcifications around the medial and lateral meniscus.  There is slight varus malalignment.    PMFS History: Patient Active Problem List   Diagnosis Date Noted   Uncontrolled hypertension 05/27/2021   Statin myopathy 09/22/2020   Thoracic aortic aneurysm without rupture 06/18/2020   TGA (transient global amnesia) 12/28/2018   Coronary artery disease of native artery of native heart with stable angina pectoris ([place]) 08/04/2017   Hyperlipidemia LDL goal <70  08/04/2017   Coronary artery calcification 08/04/2017   Past Medical History:  Diagnosis Date   Acute meniscal tear, lateral, left, initial encounter    ED (erectile dysfunction)    Elevated blood pressure reading in office with white coat syndrome, without diagnosis of hypertension    Heart disease    Hyperlipidemia    IFG (impaired fasting glucose)    Insomnia    Lumbago     Family History  Problem Relation Age of Onset   Multiple myeloma Mother    Cancer - Other Mother 63       BREAST   Other Mother        sepsis   Dementia Father    Cancer Father        neck    Cancer - Other Maternal Grandmother    Diabetes Maternal Grandmother    Heart attack Maternal Grandfather 74   Dementia Paternal Grandfather    Healthy Son    Healthy Son    Healthy Daughter    Heart attack Cousin    Cancer Paternal Grandmother     Past Surgical History:  Procedure Laterality Date   CARDIAC CATHETERIZATION     CATARACT EXTRACTION     COLONOSCOPY     LEFT HEART CATH AND CORONARY ANGIOGRAPHY N/A 08/07/2017   Procedure: LEFT HEART CATH AND CORONARY ANGIOGRAPHY;  Surgeon: Nelva Bush, MD;  Location: [facility];  Service: Cardiovascular;  Laterality: N/A;   Social History   Occupational History   Not on file  Tobacco Use   Smoking status: Former    Packs/day: 1.00    Years: 2.00    Pack years: 2.00    Types: Cigarettes    Quit date: 08/03/1969    Years since quitting: 52.2   Smokeless tobacco: Never  Vaping Use   Vaping Use: Never used  Substance and Sexual Activity   Alcohol use: Yes    Alcohol/week: 4.0 standard drinks    Types: 4 Standard drinks or equivalent per week    Comment: 1 drink every other day   Drug use: No   Sexual activity: Not on file

## 2021-11-17 ENCOUNTER — Telehealth: Payer: Self-pay | Admitting: Internal Medicine

## 2021-11-17 NOTE — Telephone Encounter (Signed)
Spoke to the pt.   Yesterday around 4PM he suddenly felt that he was "suffocating", became totally immobilized and had to lay flat on the floor. Pt states that he was able to move air in and out of his lungs but felt as if he could not breathe.   Episode lasted approximately 30-40 minutes.   Pt called 911 and when EMS arrived his SaO2 was "in the 80s."  BP range with EMS present was ~122/80 - 130/86.   Pt states that before EMS left he completely recovered and SaO2 was 96% and vitals stable.   Pt denies chest pain during episode. Pt cannot recall any further symptoms leading up to this episode or any other changes other than he has not slept as well at night the past couple months.   Pt feels stable at time of call, denies SOB or any further s/s.   Pt currently has follow up scheduled with Dr. Okey Dupre 01/06/22.   Notified pt I will discuss with Dr. Okey Dupre and will call back with further recc.

## 2021-11-17 NOTE — Telephone Encounter (Signed)
Pt c/o Shortness Of Breath: STAT if SOB developed within the last 24 hours or pt is noticeably SOB on the phone  1. Are you currently SOB (can you hear that pt is SOB on the phone)? No, but last night had an episode where he felt like he was suffocating and couldn't breathe. He had to call 911. States when he moved , he felt worse  2. How long have you been experiencing SOB? Just last night, EMS was called, by the time they came, he was "coming back". They gave oxygen. His oxygen when they arrived was 85.  3. Are you SOB when sitting or when up moving around?   4. Are you currently experiencing any other symptoms? Last night, Patient states he felt like he was suffocating. . States face was "blue/purple". Denies any chest pain.

## 2021-11-17 NOTE — Telephone Encounter (Signed)
Pt has been scheduled to see Ward Givens, NP tomorrow 1/19 at 1055.  Pt voiced understanding to go to the ER if he has recurrent symptoms.

## 2021-11-17 NOTE — Telephone Encounter (Signed)
Please have Mr. Fruehauf see me or an APP in the next day or two.  It is okay to add him onto my DOD slot tomorrow, if needed.  If he has recurrent symptoms in the meantime, he should go to the ED for further evaluation.  Nelva Bush, MD [facility]

## 2021-11-17 NOTE — Progress Notes (Signed)
Cardiology Office Note:    Date:  11/18/2021   ID:  John Mcmahon, DOB Apr 10, 1946, [name] [id number]  PCP:  John Organ., MD   [facility] Providers Cardiologist:  John Bush, MD     Referring MD: John Organ., MD   Chief Complaint  Patient presents with   OTHER    Patient c/o having difficulty breathing. Medications verbally reviewed with patient.     History of Present Illness:    John Mcmahon is a 76 y.o. male with a hx of non-obstructive CAD, thoracic aortic aneurysm, HTN, and hyperlipidemia,   He established care with our group in October 2018 for elevated coronary calcium score, fatigue, and shortness of breath at the request of his PCP. He had a stress test 25 years prior for a work physical and no further cardiac testing prior to the calcium score. He was felt to have accelerating angina and underwent LHC which revealed mild to moderate, non-critical CAD in 3 vessels (left main, left circumflex, and right coronary). Medical management was recommended.   He was last seen in our office by Dr. Saunders Mcmahon on 07/09/21 for evaluation of dyspnea and fatigue. His myocardial perfusion study the month prior was normal. He was advised to f/u in 6 months.   He presented to [facility] ED on 12/15/18 for evaluation of confusion. His wife reported that he became disoriented at home and was asking her repetitive questions. Upon arrival to the ED Code Stroke was initiated and MRI and CT revealed no hemorrhage, no acute stroke. His memory slowly returned while in the ED and he was diagnosed with transient global amnesia and followed up with neurology for EEG which was normal.   He called our office on 11/17/21 and reported an episode of feeling like he was "suffocating" at approximately 4 pm on 1/17. EMS responded and he had low SpO2 which recovered. He was not transported to the ED. He was scheduled for appointment today.  Today, he is here with his wife and states he is feeling well.  He  describes the events that occurred on 11/16/2021 as he had a normal morning with breakfast, some stretching, and walking 9 holes of golf  with no symptoms of shortness of breath, chest pain or any other concern.  He came home ate lunch and at approximately 3 PM went upstairs to water a plant.  During that time he suddenly felt like he was "suffocating" and he laid down on the floor.  States he could see his chest moving up and down but did not feel like he was getting enough air. He is uncertain how long he was on the floor but could not do more than pick up his head for at least 20 to 30 minutes. Prior to walking upstairs he did eat a few dried apricots which he eats often and noticed some mild throat scratchiness for which he put a Hall's cough drop into his mouth. Eventually, he was able to get himself slowly down the steps and into the room where his cell phone was and at about that time his wife came home. She states she returned home at 4:30 PM and is not sure how long the patient was laying on the floor as they are not certain about the time he went upstairs to water the plant.  She states that he told her he could not breathe but did so without difficulty speaking and when she got down on the floor beside  him she noticed that his face was blue.  He had removed his shirt due to excessive sweating. She immediately called 911 and upon arrival patient was found to have an SPO2 in the high 80s, for which he was placed on oxygen.  Rhythm strip showed sinus rhythm and was brought in for our analysis today. Blood pressure was 130/90's. Following the administration of oxygen, his O2 sat improved to 96% and his vitals remained stable. He felt well enough to stay at home and reports no further concerning symptoms since that time. He denies chest pain, shortness of breath, lower extremity edema, fatigue, palpitations, melena, hematuria, hemoptysis, diaphoresis, weakness, presyncope, syncope, orthopnea, and PND. He  reports a history of 3 total episodes of developing SOB when carrying something heavy that requires him to have to stop and sit down or get down on his knees and rest. These episodes have occurred over the last few years, with the first one prior to when he established care with Dr. Saunders Mcmahon in 2018. He did not have an episode like that on 11/16/21. He exercises regularly walking 2 to 2-1/2 miles most every day and also walks several holes of golf a few times a month. He stopped isosorbide 2 to 3 months ago due to severe headaches.  Hypertension is listed in his history, however he reports he has not taken antihypertensives.  Reports labile blood pressure readings at home.  Also has whitecoat hypertension per his report.   Past Medical History:  Diagnosis Date   Acute meniscal tear, lateral, left, initial encounter    Aortic arch aneurysm    a. 12/2019 CTA: 3.1cm fusiform Ao arch aneurysm - stable.   CAD (coronary artery disease)    a. 07/2017 Cath: LM nl, LAD 40p, 21m D2 25, RI 457mLCX 40d, RCA 50p, 2552mF 55-65%-->Med rx; b. 05/2021 MV: EF 55%, no ischemia/infarct-->low risk.   ED (erectile dysfunction)    Elevated blood pressure reading in office with white coat syndrome, without diagnosis of hypertension    Hyperlipidemia    IFG (impaired fasting glucose)    Insomnia    Lumbago     Past Surgical History:  Procedure Laterality Date   CARDIAC CATHETERIZATION     CATARACT EXTRACTION     COLONOSCOPY     LEFT HEART CATH AND CORONARY ANGIOGRAPHY N/A 08/07/2017   Procedure: LEFT HEART CATH AND CORONARY ANGIOGRAPHY;  Surgeon: EndNelva BushD;  Location: [facility];  Service: Cardiovascular;  Laterality: N/A;    Current Medications: Current Meds  Medication Sig   aspirin EC 81 MG tablet Take 81 mg by mouth. Take 1 tablet daily ("most days") Swallow whole.   diphenhydramine-acetaminophen (TYLENOL PM) 25-500 MG TABS tablet Take 1 tablet by mouth at bedtime as needed.   nitroGLYCERIN  (NITROSTAT) 0.4 MG SL tablet Place 1 tablet (0.4 mg total) under the tongue every 5 (five) minutes as needed for chest pain.   REPATHA SURECLICK 140132/ML SOAJ INJECT ONE PEN INTO THE SKIN EVERY 14 DAYS     Allergies:   Other   Social History   Socioeconomic History   Marital status: Married    Spouse name: Not on file   Number of children: 3   Years of education: Not on file   Highest education level: Professional school degree (e.g., MD, DDS, DVM, JD)  Occupational History   Not on file  Tobacco Use   Smoking status: Former    Packs/day: 1.00    Years:  2.00    Pack years: 2.00    Types: Cigarettes    Quit date: 08/03/1969    Years since quitting: 52.3   Smokeless tobacco: Never  Vaping Use   Vaping Use: Never used  Substance and Sexual Activity   Alcohol use: Yes    Alcohol/week: 4.0 standard drinks    Types: 4 Standard drinks or equivalent per week    Comment: 1 drink every other day   Drug use: No   Sexual activity: Not on file  Other Topics Concern   Not on file  Social History Narrative   Lives at home with spouse    Right handed   Caffeine: 1 cup tea daily   Social Determinants of Health   Financial Resource Strain: Not on file  Food Insecurity: Not on file  Transportation Needs: Not on file  Physical Activity: Not on file  Stress: Not on file  Social Connections: Not on file     Family History: The patient's family history includes Cancer in his father and paternal grandmother; Cancer - Other in his maternal grandmother; Cancer - Other (age of onset: 87) in his mother; Dementia in his father and paternal grandfather; Diabetes in his maternal grandmother; Healthy in his daughter, son, and son; Heart attack in his cousin; Heart attack (age of onset: 2) in his maternal grandfather; Multiple myeloma in his mother; Other in his mother.  ROS:   Please see the history of present illness. All other systems reviewed and are negative.  Labs/Other Studies  Reviewed:    The following studies were reviewed today:  LHC 08/07/17  Conclusions: Mid to moderate, non-critical coronary artery disease involving all 3 coronary arteries. Normal left ventricular contraction. Normal left ventricular filling pressure.   Recommendations: Medical therapy, including addition of isosorbide mononitrate. If patient continues to have chest pain and fatigue despite optimal medical therapy, FFR-guided PCI of mid LAD and/or proximal RCA could be considered. Referral to lipid clinic to discuss PCSK9 inhibitor therapy, given history of statin intolerance.    Lexiscan myoview 8/22  Nuclear stress EF: 55%. The left ventricular ejection fraction is normal (55-65%). Blood pressure demonstrated a normal response to exercise. Upsloping ST segment depression ST segment depression was noted during stress. The study is normal. This is a low risk study.  Coronary calcium score 8/18  Coronary calcium score (06/29/17): Calcium score of 952 with extensive calcifications in the LAD him a as well as less prominent calcification involving the LMCA, LCx, and RCA. Slight aneurysmal dilation of ascending aortic, measuring 4.0 cm. 18 mm left lower lobe nodule.   Recent Labs: 05/27/2021: ALT 23; BUN 18; Creatinine, Ser 0.88; Potassium 5.0; Sodium 139 11/18/2021: Hemoglobin 15.7; Platelets 148  Recent Lipid Panel    Component Value Date/Time   CHOL 117 05/27/2021 1016   TRIG 102 05/27/2021 1016   HDL 45 05/27/2021 1016   CHOLHDL 2.6 05/27/2021 1016   LDLCALC 53 05/27/2021 1016     Risk Assessment/Calculations:       Physical Exam:    VS:  BP 140/88 (BP Location: Left Arm, Patient Position: Sitting, Cuff Size: Normal)    Pulse 83    Ht 5' 10" (1.778 m)    Wt 169 lb (76.7 kg)    SpO2 97%    BMI 24.25 kg/m     Wt Readings from Last 3 Encounters:  11/18/21 169 lb (76.7 kg)  10/28/21 172 lb (78 kg)  07/09/21 172 lb (78 kg)  GEN: Well nourished, well developed in  no acute distress HEENT: Normal NECK: No JVD; No carotid bruits CARDIAC: RRR, no murmurs, rubs, gallops RESPIRATORY:  Clear to auscultation without rales, wheezing or rhonchi  ABDOMEN: Soft, non-tender, non-distended MUSCULOSKELETAL:  No edema; No deformity. 2+ pedal pulses, equal bilaterally SKIN: Warm and dry NEUROLOGIC:  Alert and oriented x 3 PSYCHIATRIC:  Normal affect   EKG:  EKG is ordered today.  The ekg ordered today demonstrates NSR at rate of 83 bpm, no ST/T wave abnormality   Diagnoses:    1. Coronary artery disease involving native coronary artery of native heart without angina pectoris   2. Pre-syncope   3. Weakness   4. Dyspnea, unspecified type   5. Hyperlipidemia LDL goal <70   6. Thoracic aortic aneurysm without rupture, unspecified part   7. Hypoxia    Assessment and Plan:     Presyncope/Weakness:  One episode on 11/16/2021 of severe weakness, extreme dyspnea that resolved after administration of oxygen and time lapse of 1-2+ hours. No further symptoms since that time and feeling well today. It is unclear what caused this episode and perplexing that he has had no further symptoms since that time. Will get cbc/tsh/mag/cmet/d-dimer today. If d-dimer is elevated, will order chest CTA. We will place a live 14 day monitor for evaluation of arrhythmia.   Dyspnea/Hypoxia: One episode of hypoxia on 11/16/21. Has a past history of 3 episodes of dyspnea over the past 5+ years when carrying heavy objects. Dyspnea resolved with a few minutes of rest and he would feel fine afterwards. He denies chest pain. He has no change in activity intolerance, exercises on a regular basis. Denies orthopnea, PND, edema.  Will get echo to evaluate heart and valvular function. Could consider further ischemic evaluation if echo reveals regional wall abnormality.   Non-obstructive CAD without angina: Cardiac catheterization 2018 revealed mild to moderate, noncritical coronary artery disease involving  the LAD, left main, left circumflex, and right coronary arteries. He denies chest pain. Could not tolerate isosorbide due to headache.  We will get echocardiogram, and consider further ischemic evaluation if regional wall abnormalities are revealed. Continue aspirin, Repatha.  Hyperlipidemia LDL goal < 70: LDL 53 on 05/27/21. Continue Repatha.   Thoracic aortic aneurysm without rupture: Mild dilatation of the aortic arch at 3.1 cm on CTA 3/21. Recommendation for 1 year follow-up. This has been followed by PCP, Dr. Brigitte Pulse in the past. We will check d-dimer today and if is elevated will get CTA.  Also will evaluate measurement of thoracic aorta by echo and order additional CTA if needed.   Disposition: 4-6 weeks with Dr. Saunders Mcmahon or APP    Medication Adjustments/Labs and Tests Ordered: Current medicines are reviewed at length with the patient today.  Concerns regarding medicines are outlined above.  Orders Placed This Encounter  Procedures   D-Dimer, Quantitative   TSH   Magnesium   Comp Met (CMET)   CBC   LONG TERM MONITOR-LIVE TELEMETRY (3-14 DAYS)   EKG 12-Lead   ECHOCARDIOGRAM COMPLETE   No orders of the defined types were placed in this encounter.   Patient Instructions  Medication Instructions:  No changes at this time.  *If you need a refill on your cardiac medications before your next appointment, please call your pharmacy*   Lab Work: D-Dimer, TSH, Mag, CMET, CBC today over at the medical mall. Check in at registration.   If you have labs (blood work) drawn today and your tests are completely  normal, you will receive your results only by: MyChart Message (if you have MyChart) OR A paper copy in the mail If you have any lab test that is abnormal or we need to change your treatment, we will call you to review the results.   Testing/Procedures: Your physician has requested that you have an echocardiogram. Echocardiography is a painless test that uses sound waves to create images  of your heart. It provides your doctor with information about the size and shape of your heart and how well your hearts chambers and valves are working. This procedure takes approximately one hour. There are no restrictions for this procedure.  Your provider has ordered a heart monitor to wear for 14 days. This will be mailed to your home with instructions on placement. Once you have finished the time frame requested, you will return monitor in box provided.      Follow-Up: At [facility], you and your health needs are our priority.  As part of our continuing mission to provide you with exceptional heart care, we have created designated Provider Care Teams.  These Care Teams include your primary Cardiologist (physician) and Advanced Practice Providers (APPs -  Physician Assistants and Nurse Practitioners) who all work together to provide you with the care you need, when you need it.   Your next appointment:   1 month(s)  The format for your next appointment:   In Person  Provider:   Nelva Bush, MD or Murray Hodgkins, NP     Signed, Emmaline Life, NP  11/18/2021 12:57 PM    [place]

## 2021-11-18 ENCOUNTER — Ambulatory Visit (INDEPENDENT_AMBULATORY_CARE_PROVIDER_SITE_OTHER): Payer: Medicare Other

## 2021-11-18 ENCOUNTER — Inpatient Hospital Stay (HOSPITAL_COMMUNITY)
Admission: EM | Admit: 2021-11-18 | Discharge: 2021-11-21 | DRG: 176 | Disposition: A | Discharge status: Home / Self Care | Payer: Medicare Other | Source: Ambulatory Visit | Attending: Internal Medicine | Admitting: Internal Medicine

## 2021-11-18 ENCOUNTER — Encounter (HOSPITAL_COMMUNITY): Payer: Self-pay

## 2021-11-18 ENCOUNTER — Emergency Department (HOSPITAL_COMMUNITY): Payer: Medicare Other

## 2021-11-18 ENCOUNTER — Other Ambulatory Visit: Payer: Self-pay

## 2021-11-18 ENCOUNTER — Encounter: Payer: Self-pay | Admitting: Nurse Practitioner

## 2021-11-18 ENCOUNTER — Ambulatory Visit: Payer: Medicare Other | Admitting: Nurse Practitioner

## 2021-11-18 ENCOUNTER — Other Ambulatory Visit
Admission: RE | Admit: 2021-11-18 | Discharge: 2021-11-18 | Disposition: A | Discharge status: Home / Self Care | Payer: Medicare Other | Source: Ambulatory Visit | Attending: Nurse Practitioner | Admitting: Nurse Practitioner

## 2021-11-18 ENCOUNTER — Emergency Department (HOSPITAL_COMMUNITY)
Admit: 2021-11-18 | Discharge: 2021-11-18 | Disposition: A | Discharge status: Home / Self Care | Payer: Medicare Other | Attending: Emergency Medicine | Admitting: Emergency Medicine

## 2021-11-18 VITALS — BP 140/88 | HR 83 | Ht 70.0 in | Wt 169.0 lb

## 2021-11-18 DIAGNOSIS — I10 Essential (primary) hypertension: Secondary | ICD-10-CM | POA: Diagnosis not present

## 2021-11-18 DIAGNOSIS — I712 Thoracic aortic aneurysm, without rupture, unspecified: Secondary | ICD-10-CM | POA: Diagnosis not present

## 2021-11-18 DIAGNOSIS — Z20822 Contact with and (suspected) exposure to covid-19: Secondary | ICD-10-CM | POA: Diagnosis present

## 2021-11-18 DIAGNOSIS — R55 Syncope and collapse: Secondary | ICD-10-CM

## 2021-11-18 DIAGNOSIS — E785 Hyperlipidemia, unspecified: Secondary | ICD-10-CM | POA: Diagnosis present

## 2021-11-18 DIAGNOSIS — R0600 Dyspnea, unspecified: Secondary | ICD-10-CM

## 2021-11-18 DIAGNOSIS — I82451 Acute embolism and thrombosis of right peroneal vein: Secondary | ICD-10-CM | POA: Diagnosis present

## 2021-11-18 DIAGNOSIS — R0902 Hypoxemia: Secondary | ICD-10-CM

## 2021-11-18 DIAGNOSIS — I82411 Acute embolism and thrombosis of right femoral vein: Secondary | ICD-10-CM | POA: Diagnosis present

## 2021-11-18 DIAGNOSIS — Z87891 Personal history of nicotine dependence: Secondary | ICD-10-CM

## 2021-11-18 DIAGNOSIS — Z888 Allergy status to other drugs, medicaments and biological substances status: Secondary | ICD-10-CM | POA: Diagnosis not present

## 2021-11-18 DIAGNOSIS — I2694 Multiple subsegmental pulmonary emboli without acute cor pulmonale: Secondary | ICD-10-CM | POA: Diagnosis not present

## 2021-11-18 DIAGNOSIS — I2510 Atherosclerotic heart disease of native coronary artery without angina pectoris: Secondary | ICD-10-CM | POA: Diagnosis present

## 2021-11-18 DIAGNOSIS — Z7982 Long term (current) use of aspirin: Secondary | ICD-10-CM

## 2021-11-18 DIAGNOSIS — Z79899 Other long term (current) drug therapy: Secondary | ICD-10-CM | POA: Diagnosis not present

## 2021-11-18 DIAGNOSIS — R778 Other specified abnormalities of plasma proteins: Secondary | ICD-10-CM | POA: Diagnosis present

## 2021-11-18 DIAGNOSIS — I25118 Atherosclerotic heart disease of native coronary artery with other forms of angina pectoris: Secondary | ICD-10-CM

## 2021-11-18 DIAGNOSIS — E1169 Type 2 diabetes mellitus with other specified complication: Secondary | ICD-10-CM | POA: Diagnosis present

## 2021-11-18 DIAGNOSIS — I2609 Other pulmonary embolism with acute cor pulmonale: Secondary | ICD-10-CM | POA: Diagnosis not present

## 2021-11-18 DIAGNOSIS — I2699 Other pulmonary embolism without acute cor pulmonale: Principal | ICD-10-CM | POA: Diagnosis present

## 2021-11-18 DIAGNOSIS — R531 Weakness: Secondary | ICD-10-CM

## 2021-11-18 DIAGNOSIS — I25119 Atherosclerotic heart disease of native coronary artery with unspecified angina pectoris: Secondary | ICD-10-CM | POA: Diagnosis present

## 2021-11-18 DIAGNOSIS — R739 Hyperglycemia, unspecified: Secondary | ICD-10-CM | POA: Diagnosis not present

## 2021-11-18 DIAGNOSIS — N529 Male erectile dysfunction, unspecified: Secondary | ICD-10-CM | POA: Diagnosis present

## 2021-11-18 DIAGNOSIS — R7989 Other specified abnormal findings of blood chemistry: Secondary | ICD-10-CM | POA: Diagnosis not present

## 2021-11-18 DIAGNOSIS — R0602 Shortness of breath: Secondary | ICD-10-CM

## 2021-11-18 DIAGNOSIS — Z9109 Other allergy status, other than to drugs and biological substances: Secondary | ICD-10-CM | POA: Diagnosis not present

## 2021-11-18 LAB — BASIC METABOLIC PANEL
Anion gap: 16 — ABNORMAL HIGH (ref 5–15)
BUN: 21 mg/dL (ref 8–23)
CO2: 26 mmol/L (ref 22–32)
Calcium: 9.8 mg/dL (ref 8.9–10.3)
Chloride: 100 mmol/L (ref 98–111)
Creatinine, Ser: 1.11 mg/dL (ref 0.61–1.24)
GFR, Estimated: >60 mL/min (ref >60)
Glucose, Bld: 171 mg/dL — ABNORMAL HIGH (ref 70–99)
Potassium: 4.3 mmol/L (ref 3.5–5.1)
Sodium: 142 mmol/L (ref 135–145)

## 2021-11-18 LAB — TROPONIN I (HIGH SENSITIVITY)
Troponin I (High Sensitivity): 43 ng/L — ABNORMAL HIGH (ref <18)
Troponin I (High Sensitivity): 36 ng/L — ABNORMAL HIGH (ref <18)

## 2021-11-18 LAB — CBC
HCT: 46 % (ref 39.0–52.0)
HCT: 46.8 % (ref 39.0–52.0)
Hemoglobin: 15.7 g/dL (ref 13.0–17.0)
Hemoglobin: 15.6 g/dL (ref 13.0–17.0)
MCH: 31 pg (ref 26.0–34.0)
MCH: 30.6 pg (ref 26.0–34.0)
MCHC: 34.1 g/dL (ref 30.0–36.0)
MCHC: 33.3 g/dL (ref 30.0–36.0)
MCV: 90.7 fL (ref 80.0–100.0)
MCV: 91.9 fL (ref 80.0–100.0)
Platelets: 148 10*3/uL — ABNORMAL LOW (ref 150–400)
Platelets: 136 10*3/uL — ABNORMAL LOW (ref 150–400)
RBC: 5.07 MIL/uL (ref 4.22–5.81)
RBC: 5.09 MIL/uL (ref 4.22–5.81)
RDW: 12.3 % (ref 11.5–15.5)
RDW: 12.2 % (ref 11.5–15.5)
WBC: 7.5 10*3/uL (ref 4.0–10.5)
WBC: 7.1 10*3/uL (ref 4.0–10.5)
nRBC: 0 % (ref 0.0–0.2)
nRBC: 0 % (ref 0.0–0.2)

## 2021-11-18 LAB — COMPREHENSIVE METABOLIC PANEL
ALT: 21 U/L (ref 0–44)
AST: 20 U/L (ref 15–41)
Albumin: 3.8 g/dL (ref 3.5–5.0)
Alkaline Phosphatase: 71 U/L (ref 38–126)
Anion gap: 8 (ref 5–15)
BUN: 21 mg/dL (ref 8–23)
CO2: 26 mmol/L (ref 22–32)
Calcium: 9.3 mg/dL (ref 8.9–10.3)
Chloride: 104 mmol/L (ref 98–111)
Creatinine, Ser: 0.84 mg/dL (ref 0.61–1.24)
GFR, Estimated: >60 mL/min (ref >60)
Glucose, Bld: 142 mg/dL — ABNORMAL HIGH (ref 70–99)
Potassium: 4.3 mmol/L (ref 3.5–5.1)
Sodium: 138 mmol/L (ref 135–145)
Total Bilirubin: 1.6 mg/dL — ABNORMAL HIGH (ref 0.3–1.2)
Total Protein: 7.2 g/dL (ref 6.5–8.1)

## 2021-11-18 LAB — RESP PANEL BY RT-PCR (FLU A&B, COVID) ARPGX2
Influenza A by PCR: NEGATIVE
Influenza B by PCR: NEGATIVE
SARS Coronavirus 2 by RT PCR: NEGATIVE

## 2021-11-18 LAB — D-DIMER, QUANTITATIVE: D-Dimer, Quant: 10.04 ug/mL-FEU — ABNORMAL HIGH (ref 0.00–0.50)

## 2021-11-18 LAB — MAGNESIUM: Magnesium: 2.1 mg/dL (ref 1.7–2.4)

## 2021-11-18 LAB — BRAIN NATRIURETIC PEPTIDE: B Natriuretic Peptide: 56.9 pg/mL (ref 0.0–100.0)

## 2021-11-18 LAB — MRSA NEXT GEN BY PCR, NASAL: MRSA by PCR Next Gen: NOT DETECTED

## 2021-11-18 LAB — TSH: TSH: 2.471 u[IU]/mL (ref 0.350–4.500)

## 2021-11-18 MED ORDER — CHLORHEXIDINE GLUCONATE CLOTH 2 % EX PADS
6.0000 | MEDICATED_PAD | Freq: Every day | CUTANEOUS | Status: DC
  Administered 2021-11-18 – 2021-11-20 (×2): 6 via TOPICAL

## 2021-11-18 MED ORDER — IOHEXOL 350 MG/ML SOLN
100.0000 mL | Freq: Once | INTRAVENOUS | Status: AC | PRN
  Administered 2021-11-18: 100 mL via INTRAVENOUS

## 2021-11-18 MED ORDER — ACETAMINOPHEN 325 MG PO TABS: 650.0000 mg | ORAL_TABLET | Freq: Four times a day (QID) | ORAL | Status: DC | PRN

## 2021-11-18 MED ORDER — HYDRALAZINE HCL 20 MG/ML IJ SOLN: 10.0000 mg | INTRAMUSCULAR | Status: DC | PRN

## 2021-11-18 MED ORDER — HEPARIN BOLUS VIA INFUSION
5300.0000 [IU] | Freq: Once | INTRAVENOUS | Status: AC
  Administered 2021-11-18: 5300 [IU] via INTRAVENOUS
  Filled 2021-11-18: qty 5300

## 2021-11-18 MED ORDER — HEPARIN (PORCINE) 25000 UT/250ML-% IV SOLN
1200.0000 [IU]/h | INTRAVENOUS | Status: DC
  Administered 2021-11-18 – 2021-11-19 (×2): 1300 [IU]/h via INTRAVENOUS
  Administered 2021-11-20: 1200 [IU]/h via INTRAVENOUS
  Filled 2021-11-18 (×3): qty 250

## 2021-11-18 NOTE — ED Notes (Signed)
Vascular Tech at bedside.  

## 2021-11-18 NOTE — Progress Notes (Signed)
Bilateral lower extremity venous duplex has been completed. Preliminary results can be found in CV Proc through chart review.  Results were given to Dr. Rush Landmark.  11/18/21 7:23 PM Olen Cordial RVT

## 2021-11-18 NOTE — Patient Instructions (Signed)
Medication Instructions:  No changes at this time.  *If you need a refill on your cardiac medications before your next appointment, please call your pharmacy*   Lab Work: D-Dimer, TSH, Mag, CMET, CBC today over at the medical mall. Check in at registration.   If you have labs (blood work) drawn today and your tests are completely normal, you will receive your results only by: MyChart Message (if you have MyChart) OR A paper copy in the mail If you have any lab test that is abnormal or we need to change your treatment, we will call you to review the results.   Testing/Procedures: Your physician has requested that you have an echocardiogram. Echocardiography is a painless test that uses sound waves to create images of your heart. It provides your doctor with information about the size and shape of your heart and how well your hearts chambers and valves are working. This procedure takes approximately one hour. There are no restrictions for this procedure.  Your provider has ordered a heart monitor to wear for 14 days. This will be mailed to your home with instructions on placement. Once you have finished the time frame requested, you will return monitor in box provided.      Follow-Up: At [facility], you and your health needs are our priority.  As part of our continuing mission to provide you with exceptional heart care, we have created designated Provider Care Teams.  These Care Teams include your primary Cardiologist (physician) and Advanced Practice Providers (APPs -  Physician Assistants and Nurse Practitioners) who all work together to provide you with the care you need, when you need it.   Your next appointment:   1 month(s)  The format for your next appointment:   In Person  Provider:   Yvonne Kendall, MD or Nicolasa Ducking, NP

## 2021-11-18 NOTE — ED Provider Notes (Signed)
[facility] EMERGENCY DEPARTMENT Provider Note   CSN: [id number] Arrival date & time: 11/18/21  1523     History  Chief Complaint  Patient presents with   Shortness of Breath    John Mcmahon is a 76 y.o. male.  The history is provided by the patient, the spouse and medical records. No language interpreter was used.  Shortness of Breath Severity:  Severe Onset quality:  Sudden Duration:  1 day Timing:  Unable to specify Progression:  Resolved Chronicity:  New Context: not URI   Relieved by:  Nothing Worsened by:  Nothing Ineffective treatments:  None tried Associated symptoms: chest pain   Associated symptoms: no abdominal pain, no cough, no diaphoresis, no fever, no headaches, no hemoptysis, no neck pain, no sputum production, no syncope (near syncope 2 days ago), no vomiting and no wheezing   Risk factors: no hx of PE/DVT       Home Medications Prior to Admission medications   Medication Sig Start Date End Date Taking? Authorizing Provider  aspirin EC 81 MG tablet Take 81 mg by mouth. Take 1 tablet daily ("most days") Swallow whole.    [provider]  diphenhydramine-acetaminophen (TYLENOL PM) 25-500 MG TABS tablet Take 1 tablet by mouth at bedtime as needed.    [provider]  nitroGLYCERIN (NITROSTAT) 0.4 MG SL tablet Place 1 tablet (0.4 mg total) under the tongue every 5 (five) minutes as needed for chest pain. 05/27/21   End, Cristal Deer, MD  REPATHA SURECLICK 140 MG/ML SOAJ INJECT ONE PEN INTO THE SKIN EVERY 14 DAYS 07/01/21   Antonieta Iba, MD      Allergies    Other    Review of Systems   Review of Systems  Constitutional:  Positive for fatigue. Negative for chills, diaphoresis and fever.  HENT:  Negative for congestion.   Respiratory:  Positive for chest tightness (resolved) and shortness of breath. Negative for cough, hemoptysis, sputum production and wheezing.   Cardiovascular:  Positive for chest pain. Negative  for palpitations, leg swelling and syncope (near syncope 2 days ago).  Gastrointestinal:  Negative for abdominal pain, constipation, diarrhea, nausea and vomiting.  Genitourinary:  Negative for dysuria and flank pain.  Musculoskeletal:  Negative for back pain and neck pain.  Skin:  Negative for wound.  Neurological:  Positive for light-headedness (resolved). Negative for dizziness, syncope, weakness and headaches.  Psychiatric/Behavioral:  Negative for agitation and confusion.   All other systems reviewed and are negative.  Physical Exam Updated Vital Signs BP (!) 158/88    Pulse 86    Temp 98.3 F (36.8 C) (Oral)    Resp 18    Ht 5\' 10"  (1.778 m)    Wt 76.6 kg    SpO2 97%    BMI 24.23 kg/m  Physical Exam Vitals and nursing note reviewed.  Constitutional:      General: He is not in acute distress.    Appearance: He is well-developed. He is not ill-appearing, toxic-appearing or diaphoretic.  HENT:     Head: Normocephalic and atraumatic.  Eyes:     Conjunctiva/sclera: Conjunctivae normal.     Pupils: Pupils are equal, round, and reactive to light.  Cardiovascular:     Rate and Rhythm: Normal rate and regular rhythm.     Heart sounds: No murmur heard. Pulmonary:     Effort: Pulmonary effort is normal. No tachypnea or respiratory distress.     Breath sounds: Normal breath sounds. No wheezing, rhonchi  or rales.  Chest:     Chest wall: No tenderness.  Abdominal:     Palpations: Abdomen is soft.     Tenderness: There is no abdominal tenderness.  Musculoskeletal:        General: No swelling.     Cervical back: Neck supple.     Right lower leg: No tenderness. No edema.     Left lower leg: No tenderness. No edema.  Skin:    General: Skin is warm and dry.     Capillary Refill: Capillary refill takes less than 2 seconds.     Findings: No erythema.  Neurological:     General: No focal deficit present.     Mental Status: He is alert.  Psychiatric:        Mood and Affect: Mood  normal.    ED Results / Procedures / Treatments   Labs (all labs ordered are listed, but only abnormal results are displayed) Labs Reviewed  BASIC METABOLIC PANEL - Abnormal; Notable for the following components:      Result Value   Glucose, Bld 171 (*)    Anion gap 16 (*)    All other components within normal limits  CBC - Abnormal; Notable for the following components:   Platelets 136 (*)    All other components within normal limits  TROPONIN I (HIGH SENSITIVITY) - Abnormal; Notable for the following components:   Troponin I (High Sensitivity) 43 (*)    All other components within normal limits  TROPONIN I (HIGH SENSITIVITY) - Abnormal; Notable for the following components:   Troponin I (High Sensitivity) 36 (*)    All other components within normal limits  RESP PANEL BY RT-PCR (FLU A&B, COVID) ARPGX2  MRSA NEXT GEN BY PCR, NASAL  BRAIN NATRIURETIC PEPTIDE  HEPARIN LEVEL (UNFRACTIONATED)  CBC  LACTIC ACID, PLASMA  COMPREHENSIVE METABOLIC PANEL    EKG EKG Interpretation  Date/Time:  Thursday November 18 2021 15:55:30 EST Ventricular Rate:  92 PR Interval:  150 QRS Duration: 76 QT Interval:  346 QTC Calculation: 427 R Axis:   4 Text Interpretation: Normal sinus rhythm Normal ECG When compared with ECG of 05-Apr-2021 11:00, PREVIOUS ECG IS PRESENT When compared to prior, similar appearance. No STEMI Confirmed by Theda Belfast ([id number]) on 11/18/2021 6:07:12 PM  Radiology CT Angio Chest PE W/Cm &/Or Wo Cm  Result Date: 11/18/2021 CLINICAL DATA:  Short of breath, positive D-dimer EXAM: CT ANGIOGRAPHY CHEST WITH CONTRAST TECHNIQUE: Multidetector CT imaging of the chest was performed using the standard protocol during bolus administration of intravenous contrast. Multiplanar CT image reconstructions and MIPs were obtained to evaluate the vascular anatomy. RADIATION DOSE REDUCTION: This exam was performed according to the departmental dose-optimization program which includes  automated exposure control, adjustment of the mA and/or kV according to patient size and/or use of iterative reconstruction technique. CONTRAST:  OMNIPAQUE IOHEXOL 350 MG/ML SOLN COMPARISON:  01/24/2020 FINDINGS: Cardiovascular: This is a technically adequate evaluation of the pulmonary vasculature. There are large bilateral pulmonary emboli involving the main pulmonary arteries and segmental branches. Moderate clot burden. There is straightening of the interventricular septum, with RV/LV ratio measuring 1.3 consistent with right heart strain. No pericardial effusion. No evidence of thoracic aortic aneurysm or dissection. Mild atherosclerosis of the aorta and coronary vasculature. Mediastinum/Nodes: No enlarged mediastinal, hilar, or axillary lymph nodes. Thyroid gland, trachea, and esophagus demonstrate no significant findings. Lungs/Pleura: No acute airspace disease, effusion, or pneumothorax. Central airways are patent. Upper Abdomen: No acute abnormality. Musculoskeletal:  No acute or destructive bony lesions. Reconstructed images demonstrate no additional findings. Review of the MIP images confirms the above findings. IMPRESSION: 1. Large bilateral pulmonary emboli, with CT evidence of right heart strain (RV/LV Ratio = 1.3) consistent with at least submassive (intermediate risk) PE. The presence of right heart strain has been associated with an increased risk of morbidity and mortality. Please refer to the "PE Focused" order set in EPIC. 2.  Aortic Atherosclerosis (ICD10-I70.0). Critical Value/emergent results were called by telephone at the time of interpretation on 11/18/2021 at 5:48 pm to provider HALEY SAGE , who verbally acknowledged these results. Electronically Signed   By: Sharlet SalinaMichael  Brown M.D.   On: 11/18/2021 17:51    Procedures Procedures    CRITICAL CARE Performed by: Canary Brimhristopher J Damyiah Moxley Total critical care time: 35 minutes Critical care time was exclusive of separately billable  procedures and treating other patients. Critical care was necessary to treat or prevent imminent or life-threatening deterioration. Critical care was time spent personally by me on the following activities: development of treatment plan with patient and/or surrogate as well as nursing, discussions with consultants, evaluation of patient's response to treatment, examination of patient, obtaining history from patient or surrogate, ordering and performing treatments and interventions, ordering and review of laboratory studies, ordering and review of radiographic studies, pulse oximetry and re-evaluation of patient's condition.   Medications Ordered in ED Medications  heparin ADULT infusion 100 units/mL (25000 units/26950mL) (1,300 Units/hr Intravenous Infusion Verify 11/19/21 0000)  acetaminophen (TYLENOL) tablet 650 mg (has no administration in time range)  Chlorhexidine Gluconate Cloth 2 % PADS 6 each (6 each Topical Given 11/18/21 2200)  hydrALAZINE (APRESOLINE) injection 10 mg (has no administration in time range)  iohexol (OMNIPAQUE) 350 MG/ML injection 100 mL (100 mLs Intravenous Contrast Given 11/18/21 1733)  heparin bolus via infusion 5,300 Units (5,300 Units Intravenous Bolus from Bag 11/18/21 1859)    ED Course/ Medical Decision Making/ A&P                           Medical Decision Making Amount and/or Complexity of Data Reviewed Labs: ordered. ECG/medicine tests: ordered.  Risk Prescription drug management. Decision regarding hospitalization.    John DuskyRichard L Nebel is a 76 y.o. male with a past medical history significant for CAD, previous thoracic aortic aneurysm, hypertension, hyperlipidemia, and previous transient global amnesia who presents at the direction of his cardiologist office for further evaluation of shortness of breath and collapse with a positive D-dimer.  According to patient and spouse, 2 days ago, patient had a very significant episode where he was walking upstairs and  then collapsed with shortness of breath and chest tightness and he reportedly turned cyanotic.  This lasted for nearly an hour and wife was able to call EMS.  When EMS arrived, he sat up and his symptoms had nearly resolved.  He denied further chest pain or shortness of breath and his color returned.  They decided not to come to the emergency department and instead he went to his cardiologist today and had blood work showing elevated D-dimer.  Patient was then sent for evaluation to rule out PE.  Otherwise he denies any current chest pain, palpitations, shortness of breath.  No lightheadedness.  He denies recent fevers, chills congestion, cough, nausea, vomiting, constipation, diarrhea, or urinary changes.  He does report he has had some waxing and waning pain in both of his legs recently but denies any history of DVT  or PE.  Denies recent surgery or recent long travel.  On exam, lungs are clear and chest is nontender.  Back is nontender.  Abdomen nontender.  Good pulses in extremities.  Patient resting comfortably.  He is hypertensive but otherwise is not tachycardic, tachypneic, or febrile.  Oxygen saturations are normal on room air initially.  EKG did not show STEMI.  Patient had CT PE study ordered in triage after hearing about the positive D-dimer with cardiology.  Prior to coming back to his exam room, he had a CT scan which did confirm evidence of bilateral large pulmonary emboli with evidence of heart strain.  I reviewed his labs and noticed his troponin also came back elevated.  Patient was eventually brought back to my exam room for his work-up his second troponin is downtrending and his labs otherwise were reassuring with similarly low platelets.  COVID test is in process.   Due to the heart strain and elevated troponin and the amount of clot on the CT PE study, critical care was called.  Initially, given his reassuring vital signs and appearance now and lack of symptoms, they suspect he might be  okay for floor but after review with their team, critical care will see and admit for further management.  We will start heparin and patient will be admitted.  Patient and family understand plan of care.         Final Clinical Impression(s) / ED Diagnoses Final diagnoses:  Acute pulmonary embolism, unspecified pulmonary embolism type, unspecified whether acute cor pulmonale present (HCC)  SOB (shortness of breath)    Clinical Impression: 1. Acute pulmonary embolism, unspecified pulmonary embolism type, unspecified whether acute cor pulmonale present (HCC)   2. SOB (shortness of breath)     Disposition: Admit  This note was prepared with assistance of Dragon voice recognition software. Occasional wrong-word or sound-a-like substitutions may have occurred due to the inherent limitations of voice recognition software.     Zandrea Kenealy, Canary Brimhristopher J, MD 11/19/21 [phone]

## 2021-11-18 NOTE — Progress Notes (Signed)
ANTICOAGULATION CONSULT NOTE - Initial Consult  Pharmacy Consult for IV Heparin Indication: pulmonary embolus  Allergies  Allergen Reactions   Other Other (See Comments)    THIMRISOL-PRESERVATIVE  Thiomersal thy-o-mer-suhleyes red and swell    Atorvastatin Other (See Comments)    Insomnia    Patient Measurements: Height: 5\' 10"  (177.8 cm) Weight: 76.6 kg (168 lb 14 oz) IBW/kg (Calculated) : 73 Heparin Dosing Weight: 76.6  Vital Signs: Temp: 98.3 F (36.8 C) (01/19 1555) Temp Source: Oral (01/19 1555) BP: 169/104 (01/19 1830) Pulse Rate: 84 (01/19 1830)  Labs: Recent Labs    11/18/21 1220 11/18/21 1600 11/18/21 1746  HGB 15.7 15.6  --   HCT 46.0 46.8  --   PLT 148* 136*  --   CREATININE 0.84 1.11  --   TROPONINIHS  --  43* 36*    Estimated Creatinine Clearance: 59.4 mL/min (by C-G formula based on SCr of 1.11 mg/dL).   Medical History: Past Medical History:  Diagnosis Date   Acute meniscal tear, lateral, left, initial encounter    Aortic arch aneurysm    a. 12/2019 CTA: 3.1cm fusiform Ao arch aneurysm - stable.   CAD (coronary artery disease)    a. 07/2017 Cath: LM nl, LAD 40p, 4m, D2 25, RI 39m, LCX 40d, RCA 50p, 30m, EF 55-65%-->Med rx; b. 05/2021 MV: EF 55%, no ischemia/infarct-->low risk.   ED (erectile dysfunction)    Elevated blood pressure reading in office with white coat syndrome, without diagnosis of hypertension    Hyperlipidemia    IFG (impaired fasting glucose)    Insomnia    Lumbago    Assessment: 75 YOM presenting with shortness of breath to ED for further evaluation after positive D-dimer from cardiologist's office. No history of anticoagulation prior to admission. CT angio positive for large, bilateral PE with CT evidence of right heart strain. Pharmacy to dose IV heparin.  Hgb 15.6 and within normal limits, platelets slightly decreased at 136 but stable. Renal function appears to be at baseline.  Goal of Therapy:  Heparin level 0.3-0.7  units/ml Monitor platelets by anticoagulation protocol: Yes   Plan:  IV heparin 5300 unit bolus x1 Start IV heparin gtt @ 1300 units/h 8h heparin level check Daily heparin level, CBC Monitor for signs and symptoms of bleeding Follow-up long-term anticoagulation plan  Thank you for involving pharmacy in this patient's care.  07/2021, PharmD PGY1 Ambulatory Care Pharmacy Resident 11/18/2021 6:58 PM  **Pharmacist phone directory can be found on [email or web address] listed under [facility]**

## 2021-11-18 NOTE — ED Provider Triage Note (Signed)
Emergency Medicine Provider Triage Evaluation Note  John Mcmahon , a 76 y.o. male  was evaluated in triage.  Pt complains of weakness.  2 days ago he had an episode where he felt short of breath like he could not move air through his chest.  Did not have any chest pain at the time, it resolved on its own.  Was seen by his cardiologist earlier today who checked D-dimer, the dimer resulted greater than 10.  He was told to go to the ED for evaluation for PE.Marland Kitchen  Review of Systems  Positive: SOB Negative: CP  Physical Exam  BP (!) 149/103 (BP Location: Right Arm)    Pulse (!) 101    Temp 98.3 F (36.8 C) (Oral)    Resp 18    Ht 5\' 10"  (1.778 m)    Wt 76.6 kg    SpO2 96%    BMI 24.23 kg/m  Gen:   Awake, no distress   Resp:  Normal effort  MSK:   Moves extremities without difficulty  Other:  Tachycardic  Medical Decision Making  Medically screening exam initiated at 4:02 PM.  Appropriate orders placed.  John Mcmahon was informed that the remainder of the evaluation will be completed by another provider, this initial triage assessment does not replace that evaluation, and the importance of remaining in the ED until their evaluation is complete.  CTA ordered   Kathlyn Sacramento, PA-C 11/18/21 1603

## 2021-11-18 NOTE — ED Triage Notes (Signed)
Pt arrived POV c/o SHOB that started 2 days ago. Pt states SHOB has subsided but was sent form the drs office for an elevated d-dimer.

## 2021-11-18 NOTE — Progress Notes (Signed)
Pt arrives from ER with 1 black cell phone, 1 grey colored charger, black pants and 1 shirt. Wife at bedside and updated on visitation policy.

## 2021-11-18 NOTE — H&P (Signed)
NAME:  John Mcmahon, [name]:  [id number], DOB:  [birth year], LOS: 0 ADMISSION DATE:  11/18/2021, CONSULTATION DATE:  11/18/2021 REFERRING MD:  Dr. Sherry Ruffing, CHIEF COMPLAINT:  PE with syncope    History of Present Illness:  John Mcmahon is a 76 y.o. male with a PMH significant for CAD, HTN, HLD, and thoracic aortic aneurysm who presented to the ED with complaints of SOB and syncope. Per report patient developed sudden onset SOB followed by near syncope. Sats in the 80s on EMS arrival. Wife described his face as purple. Event was self limited and EMS did not transport the patient to the ED. He followed up in his cardiology office the following day, at which time a D. Dimer was sent and resulted at 10. The patient was referred to the ED with concern for PE.   On ED arrival patient was seen hemodynamically stable with only mild tachycardia and elevated BP. CT chest was obtained and revealed large bilateral PE with evidence of right heart strain (RV/LV ratio = 1.3). Pertinent labwork included HS troponin 43 > 36, D-dimer 10.04, and mildly elevated glucose. Given large PE with elevated troponin and syncope PCCM consulted for further management and admission.   Upon PCCM evaluation the patient has no complaints. Feels "completely normal". Describes some R knee pain that resolved with cortisone injection a couple weeks ago and newer L knee pain that feels different than the right. No recent travel, injury, or family history of clotting disorder. No SOB described on room air.   Pertinent  Medical History  CAD HTN HLD Thoracic aortic aneurysm   Significant Hospital Events: Including procedures, antibiotic start and stop dates in addition to other pertinent events   1/19 admitted with SOB and syncope, found to have large bilateral PE with evidence of right heart strain   Interim History / Subjective:    Objective   Blood pressure (!) 175/116, pulse 73, temperature 98.3 F (36.8 C), temperature source  Oral, resp. rate 16, height 5' 10" (1.778 m), weight 76.6 kg, SpO2 94 %.       No intake or output data in the 24 hours ending 11/18/21 1844 Filed Weights   11/18/21 1601  Weight: 76.6 kg    Examination: General: elderly male of normal body habitus in no distress.  HENT: [place]/AT, PERRL, no JVD Lungs: Clear bilateral breath sounds Cardiovascular: RRR, no MRG Abdomen: Soft, non-tender, non-distended Extremities: No acute deformity or ROM limitation.  Neuro: Alert, oriented, non-focal.   Resolved Hospital Problem list     Assessment & Plan:  Large bilateral PE with CT evidence of right heart strain  DVT of the R femoral and peroneal veins.  -CT chest was obtained and revealed large bilateral PE with evidence of right heart strain (RV/LV ratio = 1.3). He did have a near syncopal vs syncopal episode on 1/17, but has had no complaints since that time at all. Pertinent labwork included HS troponin 43 > 36, D-dimer 10.04. No complaints with O2 sats 97% on room air. HR 90, BP 158/88. P: - Echocardiogram - IV heparin drip for now - Will discuss with interventional radiology - Can decide further if he is a candidate for endovascular procedure after echo report.  - BNP, Lactic acid pending  - Admit to ICU for close monitoring  CAD: - Telemetry monitoring - Supportive care  Hypertension: described as "white coat" hypertension. He was prescribed isosorbide previously, but stopped taking due to headaches. Currently he is on no  scheduled medications for this.  - SBP goal < 160 - PRN hydralazine    Best Practice (right click and "Reselect all SmartList Selections" daily)   Diet/type: NPO after midnight DVT prophylaxis: systemic heparin GI prophylaxis: N/A Lines: N/A Foley:  N/A Code Status:  full code Last date of multidisciplinary goals of care discussion [ ]  Labs   CBC: Recent Labs  Lab 11/18/21 1220 11/18/21 1600  WBC 7.5 7.1  HGB 15.7 15.6  HCT 46.0 46.8  MCV 90.7 91.9   PLT 148* 136*    Basic Metabolic Panel: Recent Labs  Lab 11/18/21 1220 11/18/21 1600  NA 138 142  K 4.3 4.3  CL 104 100  CO2 26 26  GLUCOSE 142* 171*  BUN 21 21  CREATININE 0.84 1.11  CALCIUM 9.3 9.8  MG 2.1  --    GFR: Estimated Creatinine Clearance: 59.4 mL/min (by C-G formula based on SCr of 1.11 mg/dL). Recent Labs  Lab 11/18/21 1220 11/18/21 1600  WBC 7.5 7.1    Liver Function Tests: Recent Labs  Lab 11/18/21 1220  AST 20  ALT 21  ALKPHOS 71  BILITOT 1.6*  PROT 7.2  ALBUMIN 3.8   No results for input(s): LIPASE, AMYLASE in the last 168 hours. No results for input(s): AMMONIA in the last 168 hours.  ABG No results found for: PHART, PCO2ART, PO2ART, HCO3, TCO2, ACIDBASEDEF, O2SAT   Coagulation Profile: No results for input(s): INR, PROTIME in the last 168 hours.  Cardiac Enzymes: No results for input(s): CKTOTAL, CKMB, CKMBINDEX, TROPONINI in the last 168 hours.  HbA1C: No results found for: HGBA1C  CBG: No results for input(s): GLUCAP in the last 168 hours.  Review of Systems:    Bolds are positive  Constitutional: weight loss, gain, night sweats, Fevers, chills, fatigue .  HEENT: headaches, Sore throat, sneezing, nasal congestion, post nasal drip, Difficulty swallowing, Tooth/dental problems, visual complaints visual changes, ear ache CV:  chest pain, radiates:,Orthopnea, PND, swelling in lower extremities, dizziness, palpitations, syncope.  GI  heartburn, indigestion, abdominal pain, nausea, vomiting, diarrhea, change in bowel habits, loss of appetite, bloody stools.  Resp: cough, productive: , hemoptysis, dyspnea, chest pain, pleuritic.  Skin: rash or itching or icterus GU: dysuria, change in color of urine, urgency or frequency. flank pain, hematuria  MS: joint pain or swelling. decreased range of motion  Psych: change in mood or affect. depression or anxiety.  Neuro: difficulty with speech, weakness, numbness, ataxia    Past Medical  History:  He,  has a past medical history of Acute meniscal tear, lateral, left, initial encounter, Aortic arch aneurysm, CAD (coronary artery disease), ED (erectile dysfunction), Elevated blood pressure reading in office with white coat syndrome, without diagnosis of hypertension, Hyperlipidemia, IFG (impaired fasting glucose), Insomnia, and Lumbago.   Surgical History:   Past Surgical History:  Procedure Laterality Date   CARDIAC CATHETERIZATION     CATARACT EXTRACTION     COLONOSCOPY     LEFT HEART CATH AND CORONARY ANGIOGRAPHY N/A 08/07/2017   Procedure: LEFT HEART CATH AND CORONARY ANGIOGRAPHY;  Surgeon: Nelva Bush, MD;  Location: [facility];  Service: Cardiovascular;  Laterality: N/A;     Social History:   reports that he quit smoking about 52 years ago. His smoking use included cigarettes. He has a 2.00 pack-year smoking history. He has never used smokeless tobacco. He reports current alcohol use of about 4.0 standard drinks per week. He reports that he does not use drugs.  Family History:  His family history includes Cancer in his father and paternal grandmother; Cancer - Other in his maternal grandmother; Cancer - Other (age of onset: 33) in his mother; Dementia in his father and paternal grandfather; Diabetes in his maternal grandmother; Healthy in his daughter, son, and son; Heart attack in his cousin; Heart attack (age of onset: 14) in his maternal grandfather; Multiple myeloma in his mother; Other in his mother.   Allergies Allergies  Allergen Reactions   Other Other (See Comments)    THIMRISOL-PRESERVATIVE  Thiomersal thy-o-mer-suhl    Atorvastatin Other (See Comments)    Insomnia   Grass Pollen(K-O-[id number]-T-Swt Vern)      Home Medications  Prior to Admission medications   Medication Sig Start Date End Date Taking? Authorizing Provider  aspirin EC 81 MG tablet Take 81 mg by mouth. Take 1 tablet daily ("most days") Swallow whole.    [provider]   diphenhydramine-acetaminophen (TYLENOL PM) 25-500 MG TABS tablet Take 1 tablet by mouth at bedtime as needed.    [provider]  nitroGLYCERIN (NITROSTAT) 0.4 MG SL tablet Place 1 tablet (0.4 mg total) under the tongue every 5 (five) minutes as needed for chest pain. 05/27/21   End, Harrell Gave, MD  REPATHA SURECLICK 794 MG/ML SOAJ INJECT ONE PEN INTO THE SKIN EVERY 14 DAYS 07/01/21   Minna Merritts, MD     Critical care time:      Georgann Housekeeper, AGACNP-BC [facility]  See Amion for personal pager PCCM on call pager [phone] until 7pm. Please call Elink 7p-7a. [phone]  11/18/2021 8:09 PM

## 2021-11-19 ENCOUNTER — Other Ambulatory Visit (HOSPITAL_COMMUNITY): Payer: Self-pay

## 2021-11-19 ENCOUNTER — Inpatient Hospital Stay (HOSPITAL_COMMUNITY): Payer: Medicare Other

## 2021-11-19 DIAGNOSIS — I2699 Other pulmonary embolism without acute cor pulmonale: Principal | ICD-10-CM

## 2021-11-19 DIAGNOSIS — I2609 Other pulmonary embolism with acute cor pulmonale: Secondary | ICD-10-CM

## 2021-11-19 LAB — COMPREHENSIVE METABOLIC PANEL
ALT: 16 U/L (ref 0–44)
AST: 14 U/L — ABNORMAL LOW (ref 15–41)
Albumin: 3.2 g/dL — ABNORMAL LOW (ref 3.5–5.0)
Alkaline Phosphatase: 64 U/L (ref 38–126)
Anion gap: 8 (ref 5–15)
BUN: 14 mg/dL (ref 8–23)
CO2: 26 mmol/L (ref 22–32)
Calcium: 8.4 mg/dL — ABNORMAL LOW (ref 8.9–10.3)
Chloride: 104 mmol/L (ref 98–111)
Creatinine, Ser: 0.94 mg/dL (ref 0.61–1.24)
GFR, Estimated: >60 mL/min (ref >60)
Glucose, Bld: 189 mg/dL — ABNORMAL HIGH (ref 70–99)
Potassium: 4 mmol/L (ref 3.5–5.1)
Sodium: 138 mmol/L (ref 135–145)
Total Bilirubin: 1.5 mg/dL — ABNORMAL HIGH (ref 0.3–1.2)
Total Protein: 6 g/dL — ABNORMAL LOW (ref 6.5–8.1)

## 2021-11-19 LAB — CBC
HCT: 42.1 % (ref 39.0–52.0)
Hemoglobin: 14.2 g/dL (ref 13.0–17.0)
MCH: 30.5 pg (ref 26.0–34.0)
MCHC: 33.7 g/dL (ref 30.0–36.0)
MCV: 90.3 fL (ref 80.0–100.0)
Platelets: 115 10*3/uL — ABNORMAL LOW (ref 150–400)
RBC: 4.66 MIL/uL (ref 4.22–5.81)
RDW: 12.3 % (ref 11.5–15.5)
WBC: 5.7 10*3/uL (ref 4.0–10.5)
nRBC: 0 % (ref 0.0–0.2)

## 2021-11-19 LAB — HEPARIN LEVEL (UNFRACTIONATED)
Heparin Unfractionated: 0.65 IU/mL (ref 0.30–0.70)
Heparin Unfractionated: 0.79 IU/mL — ABNORMAL HIGH (ref 0.30–0.70)
Heparin Unfractionated: 0.57 IU/mL (ref 0.30–0.70)

## 2021-11-19 LAB — ECHOCARDIOGRAM COMPLETE
AR max vel: 2.52 cm2
AV Area VTI: 2.44 cm2
AV Area mean vel: 2.54 cm2
AV Mean grad: 3.5 mmHg
AV Peak grad: 5.9 mmHg
Ao pk vel: 1.21 m/s
Height: 70 in
Weight: 2701.96 oz

## 2021-11-19 LAB — LACTIC ACID, PLASMA: Lactic Acid, Venous: 1.1 mmol/L (ref 0.5–1.9)

## 2021-11-19 MED ORDER — HYDRALAZINE HCL 20 MG/ML IJ SOLN: 10.0000 mg | INTRAMUSCULAR | Status: DC | PRN

## 2021-11-19 NOTE — Progress Notes (Incomplete)
Attending note: I have seen and examined the patient. History, labs and imaging reviewed.  76 Y/O with CAD admitted with submassive PE, DVT Started on anticoagulation. Discussed with IR and we held off on lytic therapy. He remains stable hemodynamically and on room air.   Blood pressure 129/81, pulse 65, temperature 97.8 F (36.6 C), temperature source Oral, resp. rate 17, height 5\' 10"  (1.778 m), weight 76.6 kg, SpO2 91 %. Gen:      No acute distress HEENT:  EOMI, sclera anicteric Neck:     No masses; no thyromegaly Lungs:    Clear to auscultation bilaterally; normal respiratory effort CV:         Regular rate and rhythm; no murmurs Abd:      + bowel sounds; soft, non-tender; no palpable masses, no distension Ext:    No edema; adequate peripheral perfusion Skin:      Warm and dry; no rash Neuro: alert and oriented x 3 Psych: normal mood and affect   Labs/Imaging personally reviewed, significant for   Assessment/plan: Submassive PE Follow echocardiogram Continue anticoagulation Can transition to DOAC tomorrow  Stable for transfer out of ICU  The patient is critically ill with multiple organ systems failure and requires high complexity decision making for assessment and support, frequent evaluation and titration of therapies, application of advanced monitoring technologies and extensive interpretation of multiple databases.  Critical care time - 35 mins. This represents my time independent of the NPs time taking care of the pt.  Marshell Garfinkel MD [facility] 11/19/2021, 9:27 AM

## 2021-11-19 NOTE — Progress Notes (Signed)
ANTICOAGULATION CONSULT NOTE - Initial Consult  Pharmacy Consult for IV Heparin Indication: pulmonary embolus  Allergies  Allergen Reactions   Other Swelling and Other (See Comments)    Thiomersal (in solution for contact lenses) = Red and swollen eyes    Atorvastatin Other (See Comments)    Insomnia    Patient Measurements: Height: 5\' 10"  (177.8 cm) Weight: 76.6 kg (168 lb 14 oz) IBW/kg (Calculated) : 73 Heparin Dosing Weight: 76.6 kg  Vital Signs: Temp: 97.7 F (36.5 C) (01/20 1118) Temp Source: Oral (01/20 1118) BP: 149/108 (01/20 0900) Pulse Rate: 73 (01/20 0900)  Labs: Recent Labs    11/18/21 1220 11/18/21 1600 11/18/21 1746 11/19/21 0327 11/19/21 1108  HGB 15.7 15.6  --  14.2  --   HCT 46.0 46.8  --  42.1  --   PLT 148* 136*  --  115*  --   HEPARINUNFRC  --   --   --  0.65 0.79*  CREATININE 0.84 1.11  --  0.94  --   TROPONINIHS  --  43* 36*  --   --      Estimated Creatinine Clearance: 70.1 mL/min (by C-G formula based on SCr of 0.94 mg/dL).   Medical History: Past Medical History:  Diagnosis Date   Acute meniscal tear, lateral, left, initial encounter    Aortic arch aneurysm    a. 12/2019 CTA: 3.1cm fusiform Ao arch aneurysm - stable.   CAD (coronary artery disease)    a. 07/2017 Cath: LM nl, LAD 40p, 71m, D2 25, RI 70m, LCX 40d, RCA 50p, 5m, EF 55-65%-->Med rx; b. 05/2021 MV: EF 55%, no ischemia/infarct-->low risk.   ED (erectile dysfunction)    Elevated blood pressure reading in office with white coat syndrome, without diagnosis of hypertension    Hyperlipidemia    IFG (impaired fasting glucose)    Insomnia    Lumbago    Assessment: 68 YOM presented with SOB, elevated D-dimer at cardiologists office found to have DVT and bilateral PE with right heart strain. Pharmacy to dose IV heparin.  Heparin is slightly supratherapeutic on 1300 units/hr. Level drawn appropriately. Hgb stable and platelets trending down, now 115. No s/sx bleeding.   Goal  of Therapy:  Heparin level 0.3-0.7 units/ml Monitor platelets by anticoagulation protocol: Yes   Plan:  Reduce heparin infusion to 1200 units/hr Obtain 8 hour heparin level Daily heparin level and CBC Monitor s/sx of bleeding F/u transition to PO anticoagulation on 1/21  Thank you for involving pharmacy in this patient's care.  Laurey Arrow, PharmD PGY1 Pharmacy Resident 11/19/2021  12:31 PM  Please check [email or web address] for unit-specific pharmacy phone numbers.

## 2021-11-19 NOTE — Progress Notes (Signed)
76 yo male with  sudden onset of dyspnea on 1/17 with near syncopal event . CT angio chest c/w large b/l PE with RV:LV ratio 1.3, and mild troponin elevation. Doppler of legs that c/w Rt femoral and peroneal vein DVT.  A/p  -seen via E-link -comfortable, VSS talking to his wife - BP 148/53/ HR 78/ Pox 94% -submassive PE with Rt leg DVT , no consideration of tPA at this time  - continue heparin gtt -can switch to Lovenox in AM

## 2021-11-19 NOTE — Progress Notes (Signed)
ANTICOAGULATION CONSULT NOTE - Follow Up Consult  Pharmacy Consult for heparin Indication:  PE/DVT  Labs: Recent Labs    11/18/21 1220 11/18/21 1600 11/18/21 1746 11/19/21 0327  HGB 15.7 15.6  --  14.2  HCT 46.0 46.8  --  42.1  PLT 148* 136*  --  115*  HEPARINUNFRC  --   --   --  0.65  CREATININE 0.84 1.11  --  0.94  TROPONINIHS  --  43* 36*  --     Assessment/Plan:  76yo male therapeutic on heparin with initial dosing for acute VTE. Will continue infusion at current rate of 1300 units/hr and confirm stable with additional level.   Wynona Neat, PharmD, BCPS  11/19/2021,5:13 AM

## 2021-11-19 NOTE — Progress Notes (Signed)
[facility] for IV Heparin Indication: pulmonary embolus  Allergies  Allergen Reactions   Other Swelling and Other (See Comments)    Thiomersal (in solution for contact lenses) = Red and swollen eyes    Atorvastatin Other (See Comments)    Insomnia    Patient Measurements: Height: 5\' 10"  (177.8 cm) Weight: 76.6 kg (168 lb 14 oz) IBW/kg (Calculated) : 73 Heparin Dosing Weight: 76.6 kg  Vital Signs: Temp: 97.9 F (36.6 C) (01/20 2001) Temp Source: Oral (01/20 2001) BP: 140/79 (01/20 1900) Pulse Rate: 86 (01/20 1900)  Labs: Recent Labs    11/18/21 1220 11/18/21 1600 11/18/21 1746 11/19/21 0327 11/19/21 1108 11/19/21 2108  HGB 15.7 15.6  --  14.2  --   --   HCT 46.0 46.8  --  42.1  --   --   PLT 148* 136*  --  115*  --   --   HEPARINUNFRC  --   --   --  0.65 0.79* 0.57  CREATININE 0.84 1.11  --  0.94  --   --   TROPONINIHS  --  43* 36*  --   --   --      Estimated Creatinine Clearance: 70.1 mL/min (by C-G formula based on SCr of 0.94 mg/dL).   Medical History: Past Medical History:  Diagnosis Date   Acute meniscal tear, lateral, left, initial encounter    Aortic arch aneurysm    a. 12/2019 CTA: 3.1cm fusiform Ao arch aneurysm - stable.   CAD (coronary artery disease)    a. 07/2017 Cath: LM nl, LAD 40p, 61m, D2 25, RI 6m, LCX 40d, RCA 50p, 60m, EF 55-65%-->Med rx; b. 05/2021 MV: EF 55%, no ischemia/infarct-->low risk.   ED (erectile dysfunction)    Elevated blood pressure reading in office with white coat syndrome, without diagnosis of hypertension    Hyperlipidemia    IFG (impaired fasting glucose)    Insomnia    Lumbago    Assessment: 59 YOM presented with SOB, elevated D-dimer at cardiologists office found to have DVT and bilateral PE with right heart strain. Pharmacy to dose IV heparin.  Heparin is therapeutic on 1200 units/hr.  Goal of Therapy:  Heparin level 0.3-0.7 units/ml Monitor platelets by anticoagulation  protocol: Yes   Plan:  Continue heparin infusion 1200 units/hr Daily heparin level and CBC F/u transition to PO anticoagulation on 1/21  Hildred Laser, PharmD Clinical Pharmacist **Pharmacist phone directory can now be found on [email or web address] ([email or web address]).  Listed under [place].

## 2021-11-19 NOTE — TOC Benefit Eligibility Note (Signed)
Patient Teacher, English as a foreign language completed.    The patient is currently admitted and upon discharge could be taking Eliquis 5 mg.  The current 30 day co-pay is, $37.00.   The patient is currently admitted and upon discharge could be taking Xarelto 20 mg.  The current 30 day co-pay is, $37.00.   The patient is insured through [employer] Medicare Part D    Lyndel Safe, [place] Patient Advocate Specialist [facility] Patient Advocate Team Direct Number: [phone]  Fax: [phone]

## 2021-11-19 NOTE — Progress Notes (Signed)
NAME:  John Mcmahon, [name]:  [id number], DOB:  12/09/1945, LOS: 1 ADMISSION DATE:  11/18/2021, CONSULTATION DATE: 11/18/2021 REFERRING MD: Dr. Sherry Ruffing, CHIEF COMPLAINT: Pulmonary embolism  History of Present Illness:  John Mcmahon is a 76 y.o. male with a PMH significant for CAD, HTN, HLD, and thoracic aortic aneurysm who presented to the ED with complaints of SOB and syncope. Per report patient developed sudden onset SOB followed by near syncope. Sats in the 80s on EMS arrival. Wife described his face as purple. Event was self limited and EMS did not transport the patient to the ED. He followed up in his cardiology office the following day, at which time a D. Dimer was sent and resulted at 10. The patient was referred to the ED with concern for PE.    On ED arrival patient was seen hemodynamically stable with only mild tachycardia and elevated BP. CT chest was obtained and revealed large bilateral PE with evidence of right heart strain (RV/LV ratio = 1.3). Pertinent labwork included HS troponin 43 > 36, D-dimer 10.04, and mildly elevated glucose. Given large PE with elevated troponin and syncope PCCM consulted for further management and admission.    Upon PCCM evaluation the patient has no complaints. Feels "completely normal". Describes some R knee pain that resolved with cortisone injection a couple weeks ago and newer L knee pain that feels different than the right. No recent travel, injury, or family history of clotting disorder. No SOB described on room air.   Pertinent  Medical History  CAD HTN HLD Thoracic aortic aneurysm  Significant Hospital Events: Including procedures, antibiotic start and stop dates in addition to other pertinent events   1/19 admitted with SOB and syncope, found to have large bilateral PE with evidence of right heart strain  1/19 BLE Doppler >> DVT in the right femoral vein and right peroneal vein 1/20 TTE >> EF 60 to 65%, no valvular abnormalities, no right  heart strain  Interim History / Subjective:  No acute events overnight Patient comfortable on room air, BP and heart rate normal. Wife and son at bedside  Objective   Blood pressure 129/81, pulse 65, temperature 98.2 F (36.8 C), temperature source Oral, resp. rate 17, height 5\' 10"  (1.778 m), weight 76.6 kg, SpO2 91 %.        Intake/Output Summary (Last 24 hours) at 11/19/2021 0743 Last data filed at 11/19/2021 0700 Gross per 24 hour  Intake 328.71 ml  Output 850 ml  Net -521.29 ml   Filed Weights   11/18/21 1601 11/18/21 2230  Weight: 76.6 kg 76.6 kg    Examination: General: Well-appearing, elderly man laying in bed.  NAD. HENT: /AT.  Moist mucous membrane. Lungs: CTAB.  No wheezing or rales. Cardiovascular: RRR.  No murmurs, rubs or gallops. Abdomen: Soft.  NT/ND.  Normal BS. Extremities: Warm.  Well perfused.  No edema Neuro: Alert and oriented x3.  Moves all extremities.  Resolved Hospital Problem list   N/A  Assessment & Plan:  Large bilateral PE with CT evidence of right heart strain  DVT of the R femoral and peroneal veins.  Patient remains hemodynamically stable.  Comfortable on room air.  Denies any pleuritic chest pain or SOB.  TTE today shows EF 60-65%, no valvular abnormalities and no evidence of right heart strain.  No indication for endovascular procedure as patient remained asymptomatic and echo does not show any significant strain on the heart. --Transfer to floor --Continue systemic heparin --Transition to  DOAC tomorrow  -- PE seem to be unprovoked, will likely need anticoagulation indefinitely.  CAD HLD Hypertension BP has been relatively stable with SBP in the 120s-140s.  Troponins peaked at 43, downtrending.  Denies any SOB or chest pain. --CTM --Continue PRN hydralazine --Restart home ASA prior to discharge --Being considered for PCSK9 inhibitor therapy due to history of statin intolerance  Best Practice (right click and "Reselect all  SmartList Selections" daily)   Diet/type: Regular consistency (see orders) and NPO DVT prophylaxis: systemic heparin GI prophylaxis: N/A Lines: N/A Foley:  N/A Code Status:  full code Last date of multidisciplinary goals of care discussion [1/20 spoke to spouse and son in room]  Labs   CBC: Recent Labs  Lab 11/18/21 1220 11/18/21 1600 11/19/21 0327  WBC 7.5 7.1 5.7  HGB 15.7 15.6 14.2  HCT 46.0 46.8 42.1  MCV 90.7 91.9 90.3  PLT 148* 136* 115*    Basic Metabolic Panel: Recent Labs  Lab 11/18/21 1220 11/18/21 1600 11/19/21 0327  NA 138 142 138  K 4.3 4.3 4.0  CL 104 100 104  CO2 26 26 26   GLUCOSE 142* 171* 189*  BUN 21 21 14   CREATININE 0.84 1.11 0.94  CALCIUM 9.3 9.8 8.4*  MG 2.1  --   --    GFR: Estimated Creatinine Clearance: 70.1 mL/min (by C-G formula based on SCr of 0.94 mg/dL). Recent Labs  Lab 11/18/21 1220 11/18/21 1600 11/19/21 0327  WBC 7.5 7.1 5.7  LATICACIDVEN  --   --  1.1    Liver Function Tests: Recent Labs  Lab 11/18/21 1220 11/19/21 0327  AST 20 14*  ALT 21 16  ALKPHOS 71 64  BILITOT 1.6* 1.5*  PROT 7.2 6.0*  ALBUMIN 3.8 3.2*   No results for input(s): LIPASE, AMYLASE in the last 168 hours. No results for input(s): AMMONIA in the last 168 hours.  ABG No results found for: PHART, PCO2ART, PO2ART, HCO3, TCO2, ACIDBASEDEF, O2SAT   Coagulation Profile: No results for input(s): INR, PROTIME in the last 168 hours.  Cardiac Enzymes: No results for input(s): CKTOTAL, CKMB, CKMBINDEX, TROPONINI in the last 168 hours.  HbA1C: No results found for: HGBA1C  CBG: No results for input(s): GLUCAP in the last 168 hours.   Critical care time: 30

## 2021-11-20 DIAGNOSIS — I712 Thoracic aortic aneurysm, without rupture, unspecified: Secondary | ICD-10-CM

## 2021-11-20 DIAGNOSIS — I25118 Atherosclerotic heart disease of native coronary artery with other forms of angina pectoris: Secondary | ICD-10-CM

## 2021-11-20 DIAGNOSIS — E785 Hyperlipidemia, unspecified: Secondary | ICD-10-CM

## 2021-11-20 LAB — CBC
HCT: 46 % (ref 39.0–52.0)
Hemoglobin: 15.2 g/dL (ref 13.0–17.0)
MCH: 30.2 pg (ref 26.0–34.0)
MCHC: 33 g/dL (ref 30.0–36.0)
MCV: 91.3 fL (ref 80.0–100.0)
Platelets: 134 10*3/uL — ABNORMAL LOW (ref 150–400)
RBC: 5.04 MIL/uL (ref 4.22–5.81)
RDW: 12.4 % (ref 11.5–15.5)
WBC: 6 10*3/uL (ref 4.0–10.5)
nRBC: 0 % (ref 0.0–0.2)

## 2021-11-20 LAB — HEPARIN LEVEL (UNFRACTIONATED): Heparin Unfractionated: 0.58 IU/mL (ref 0.30–0.70)

## 2021-11-20 MED ORDER — DOCUSATE SODIUM 100 MG PO CAPS
100.0000 mg | ORAL_CAPSULE | Freq: Every day | ORAL | Status: DC | PRN
  Administered 2021-11-20: 100 mg via ORAL
  Filled 2021-11-20: qty 1

## 2021-11-20 MED ORDER — POLYETHYLENE GLYCOL 3350 17 G PO PACK
17.0000 g | PACK | Freq: Every day | ORAL | Status: DC
  Administered 2021-11-20: 17 g via ORAL
  Filled 2021-11-20 (×2): qty 1

## 2021-11-20 MED ORDER — APIXABAN 5 MG PO TABS
10.0000 mg | ORAL_TABLET | Freq: Two times a day (BID) | ORAL | Status: DC
  Administered 2021-11-20 – 2021-11-21 (×3): 10 mg via ORAL
  Filled 2021-11-20 (×3): qty 2

## 2021-11-20 MED ORDER — APIXABAN 5 MG PO TABS: 5.0000 mg | ORAL_TABLET | Freq: Two times a day (BID) | ORAL | Status: DC

## 2021-11-20 NOTE — Assessment & Plan Note (Signed)
Resume Repatha injections post discharge.

## 2021-11-20 NOTE — Evaluation (Signed)
Physical Therapy Evaluation Patient Details Name: John Mcmahon [name]: [id number] DOB: Apr 02, 1946 Today's Date: 11/20/2021  History of Present Illness  76 yo male presents to [facility] on 1/19 with ShOB x2 days, elevated d-dimer. CT scan confirmed evidence of bilateral large pulmonary emboli with evidence of heart strain, LE dopplar shows R femoral and peroneal vein DVT. on IV heparin. PMH includes CAD, previous thoracic aortic aneurysm, hypertension, hyperlipidemia, and previous transient global amnesia.  Clinical Impression   Pt presents with [facility] strength, balance, mobility and activity tolerance on eval. Pt ambulated 450 ft in hallway without evidence of imbalance, even when challenged. Pt maintained SpO2 95% on RA with no evidence of dyspnea on exertion. PT educated pt on use of pulse ox and self-monitoring dyspnea on exertion to ensure safety with mobility, as well as progressive return to PLOF (daily walking program). Pt expresses understanding, pt appropriate to mobilize independently while acute. PT to sign off.       Recommendations for follow up therapy are one component of a multi-disciplinary discharge planning process, led by the attending physician.  Recommendations may be updated based on patient status, additional functional criteria and insurance authorization.  Follow Up Recommendations No PT follow up    Assistance Recommended at Discharge PRN  Patient can return home with the following       Equipment Recommendations None recommended by PT  Recommendations for Other Services       Functional Status Assessment Patient has not had a recent decline in their functional status     Precautions / Restrictions Precautions Precautions: None Restrictions Weight Bearing Restrictions: No      Mobility  Bed Mobility Overal bed mobility: Independent                  Transfers Overall transfer level: Independent                       Ambulation/Gait Ambulation/Gait assistance: Modified independent (Device/Increase time) Gait Distance (Feet): 450 Feet Assistive device: None Gait Pattern/deviations: WFL(Within Functional Limits), Step-through pattern Gait velocity: WFL     General Gait Details: initially slowed speed and pt reported feeling slightly unsteady (not noticable to PT), progressing to independence and tolerating challenge to dynamic balance without difficulty. SpO2 95% and greater on RA  Stairs            Wheelchair Mobility    Modified Rankin (Stroke Patients Only)       Balance Overall balance assessment: Independent                               Standardized Balance Assessment Standardized Balance Assessment :  (tolerated head turns, step over object, 180 degree turning without difficulty or imbalance)           Pertinent Vitals/Pain Pain Assessment Pain Assessment: No/denies pain    Home Living Family/patient expects to be discharged to:: Private residence Living Arrangements: Spouse/significant other Available Help at Discharge: Family;Available 24 hours/day Type of Home: House Home Access: Stairs to enter     Alternate Level Stairs-Number of Steps: flight Home Layout: Two level;Bed/bath upstairs Home Equipment: None      Prior Function Prior Level of Function : Independent/Modified Independent             Mobility Comments: pt enjoys walking daily, likes to stay busy and active       Hand Dominance   Dominant Hand:  Right    Extremity/Trunk Assessment   Upper Extremity Assessment Upper Extremity Assessment: Overall WFL for tasks assessed    Lower Extremity Assessment Lower Extremity Assessment: Overall WFL for tasks assessed    Cervical / Trunk Assessment Cervical / Trunk Assessment: Normal  Communication   Communication: No difficulties  Cognition Arousal/Alertness: Awake/alert Behavior During Therapy: WFL for tasks  assessed/performed Overall Cognitive Status: Within Functional Limits for tasks assessed                                          General Comments      Exercises     Assessment/Plan    PT Assessment Patient does not need any further PT services  PT Problem List         PT Treatment Interventions      PT Goals (Current goals can be found in the Care Plan section)  Acute Rehab PT Goals Patient Stated Goal: home PT Goal Formulation: With patient Time For Goal Achievement: 11/20/21 Potential to Achieve Goals: Good    Frequency       Co-evaluation               AM-PAC PT "6 Clicks" Mobility  Outcome Measure Help needed turning from your back to your side while in a flat bed without using bedrails?: None Help needed moving from lying on your back to sitting on the side of a flat bed without using bedrails?: None Help needed moving to and from a bed to a chair (including a wheelchair)?: None Help needed standing up from a chair using your arms (e.g., wheelchair or bedside chair)?: None Help needed to walk in hospital room?: None Help needed climbing 3-5 steps with a railing? : None 6 Click Score: 24    End of Session   Activity Tolerance: Patient tolerated treatment well Patient left: in chair;with call bell/phone within reach Nurse Communication: Mobility status PT Visit Diagnosis: Other abnormalities of gait and mobility (R26.89)    Time: 8206-0156 PT Time Calculation (min) (ACUTE ONLY): 17 min   Charges:   PT Evaluation $PT Eval Low Complexity: 1 Low         Nomar Broad S, PT DPT Acute Rehabilitation Services Pager [phone]  Office [phone]   Tyrone Apple E Christain [place] 11/20/2021, 2:30 PM

## 2021-11-20 NOTE — Progress Notes (Addendum)
Transitioned from heparin to apixaban this am  Thank you for allowing pharmacy to be a part of this patients care.  Donnald Garre, PharmD Clinical Pharmacist  Please check AMION for all [place] numbers After 10:00 PM, call [facility] for IV Heparin Indication: pulmonary embolus  Allergies  Allergen Reactions   Other Swelling and Other (See Comments)    Thiomersal (in solution for contact lenses) = Red and swollen eyes    Atorvastatin Other (See Comments)    Insomnia    Patient Measurements: Height: 5\' 10"  (177.8 cm) Weight: 76.6 kg (168 lb 14 oz) IBW/kg (Calculated) : 73 Heparin Dosing Weight: 76.6 kg  Vital Signs: Temp: 98.1 F (36.7 C) (01/21 0731) Temp Source: Oral (01/21 0731) BP: 124/77 (01/21 0820) Pulse Rate: 82 (01/21 0820)  Labs: Recent Labs    11/18/21 1220 11/18/21 1600 11/18/21 1600 11/18/21 1746 11/19/21 0327 11/19/21 1108 11/19/21 2108 11/20/21 0551  HGB 15.7 15.6  --   --  14.2  --   --  15.2  HCT 46.0 46.8  --   --  42.1  --   --  46.0  PLT 148* 136*  --   --  115*  --   --  134*  HEPARINUNFRC  --   --    < >  --  0.65 0.79* 0.57 0.58  CREATININE 0.84 1.11  --   --  0.94  --   --   --   TROPONINIHS  --  43*  --  36*  --   --   --   --    < > = values in this interval not displayed.     Estimated Creatinine Clearance: 70.1 mL/min (by C-G formula based on SCr of 0.94 mg/dL).   Medical History: Past Medical History:  Diagnosis Date   Acute meniscal tear, lateral, left, initial encounter    Aortic arch aneurysm    a. 12/2019 CTA: 3.1cm fusiform Ao arch aneurysm - stable.   CAD (coronary artery disease)    a. 07/2017 Cath: LM nl, LAD 40p, 12m, D2 25, RI 51m, LCX 40d, RCA 50p, 61m, EF 55-65%-->Med rx; b. 05/2021 MV: EF 55%, no ischemia/infarct-->low risk.   ED (erectile dysfunction)    Elevated blood pressure reading in office with white coat syndrome, without  diagnosis of hypertension    Hyperlipidemia    IFG (impaired fasting glucose)    Insomnia    Lumbago    Assessment: 68 YOM presented with SOB, elevated D-dimer at cardiologists office found to have DVT and bilateral PE with right heart strain. Pharmacy to dose IV heparin.  Heparin level 0.58 (on 1200 units/hr)  Goal of Therapy:  Heparin level 0.3-0.7 units/ml Monitor platelets by anticoagulation protocol: Yes   Plan:  Continue heparin infusion 1200 units/hr Daily heparin level and CBC F/u transition to PO anticoagulation on 1/21  Thank you for allowing pharmacy to be a part of this patients care.  Donnald Garre, PharmD Clinical Pharmacist  Please check AMION for all [place] numbers After 10:00 PM, call [facility] [phone]

## 2021-11-20 NOTE — Assessment & Plan Note (Addendum)
Initially admitted to the ICU-and started on IV heparin.  Did not require thrombolytics.  Given hemodynamic stability-was transitioned to Eliquis on 1/21 (risk/benefits/alternatives discussed at bedside).  Given unprovoked submassive PE-probably will benefit from indefinite anticoagulation.  Has tolerated initiation of Eliquis overnight without any major issues-in fact ambulated around 450 feet with physical therapy-he did not have any major shortness of breath.  Plan is to discharge home on oral anticoagulation-patient will need outpatient evaluation with hematology (will send epic referral)

## 2021-11-20 NOTE — Assessment & Plan Note (Addendum)
No anginal symptoms-probably can discontinue aspirin as patient will be on full dose anticoagulation.  I have asked patient to get in touch with his cardiologist over the next few days to see if he can stay off aspirin indefinitely.

## 2021-11-20 NOTE — Progress Notes (Signed)
PROGRESS NOTE        PATIENT DETAILS Name: John Mcmahon Age: 76 y.o. Sex: male Date of Birth: September 28, 1946 Admit Date: 11/18/2021 Admitting Physician Chesley Mires, MD [id number], Emily Filbert., MD  Brief Narrative: Patient is a 76 y.o. male with history of nonobstructive CAD, HTN, HLD, thoracic aortic aneurysm-who presented with presyncope/shortness of breath-2 days prior to this hospitalization.  EMS arrived on the scene-provided supportive care-and since this was a self-limited episode-patient was not transported to the ED.  Patient subsequently went to cardiology office for evaluation of this episode-and was found to have significantly elevated D-dimers.  Patient was then referred to the ED where he was found to have a submassive pulmonary embolism.  He was started on IV heparin-monitored in the [place] subsequently transferred to the [facility] on 1/21.  Subjective: Lying comfortably in bed-denies any chest pain or shortness of breath.  Objective: Vitals: Blood pressure 124/77, pulse 82, temperature 98.1 F (36.7 C), temperature source Oral, resp. rate 15, height 5\' 10"  (1.778 m), weight 76.6 kg, SpO2 94 %.   Exam: Gen Exam:Alert awake-not in any distress HEENT:atraumatic, normocephalic Chest: B/L clear to auscultation anteriorly CVS:S1S2 regular Abdomen:soft non tender, non distended Extremities:no edema Neurology: Non focal Skin: no rash  Pertinent Labs/Radiology: CBC Latest Ref Rng & Units 11/20/2021 11/19/2021 11/18/2021  WBC 4.0 - 10.5 K/uL 6.0 5.7 7.1  Hemoglobin 13.0 - 17.0 g/dL 15.2 14.2 15.6  Hematocrit 39.0 - 52.0 % 46.0 42.1 46.8  Platelets 150 - 400 K/uL 134(L) 115(L) 136(L)    Lab Results  Component Value Date   NA 138 11/19/2021   K 4.0 11/19/2021   CL 104 11/19/2021   CO2 26 11/19/2021    1/20>> TTE: EF [id number], RV systolic function normal.  Assessment/Plan: * Submassive pulmonary embolism with age-indeterminate DVT  involving right femoral and peroneal veins- (present on admission) Hemodynamically stable on room air- will transition to Eliquis today (risk/benefits/alternatives discussed at bedside).  Given unprovoked submassive PE-probably will benefit from indefinite anticoagulation.  We will monitor closely-and if remains stable on oral anticoagulation-probably discharge home tomorrow.  Patient aware that he will need outpatient hematology evaluation.  Coronary artery disease of native artery of native heart with stable angina pectoris ([place])- (present on admission) No anginal symptoms-probably can discontinue aspirin as patient will be on full dose anticoagulation.  Hyperlipidemia LDL goal <70- (present on admission) Resume Repatha injections post discharge.  Thoracic aortic aneurysm without rupture- (present on admission) Continue outpatient surveillance post discharge.   BMI: Estimated body mass index is 24.23 kg/m as calculated from the following:   Height as of this encounter: 5\' 10"  (1.778 m).   Weight as of this encounter: 76.6 kg.    DVT Prophylaxis: eliquis Procedures: None Consults: PCCM Code Status:Full code  Family Communication: None at bedside   Disposition Plan: Status is: Inpatient  Remains inpatient appropriate because: Transitioning from IV heparin to oral anticoagulation today-if remains stable-Home on 1/22.   Diet: Diet Order             Diet Heart Room service appropriate? Yes; Fluid consistency: Thin  Diet effective now                     Antimicrobial agents: Anti-infectives (From admission, onward)    None        MEDICATIONS:  Scheduled Meds:  apixaban  10 mg Oral BID   Followed by   Derrill Memo ON 11/27/2021] apixaban  5 mg Oral BID   Chlorhexidine Gluconate Cloth  6 each Topical Daily   polyethylene glycol  17 g Oral Daily   Continuous Infusions:   PRN Meds:.acetaminophen, docusate sodium, hydrALAZINE   I have personally reviewed  following labs and imaging studies  LABORATORY DATA: CBC: Recent Labs  Lab 11/18/21 1220 11/18/21 1600 11/19/21 0327 11/20/21 0551  WBC 7.5 7.1 5.7 6.0  HGB 15.7 15.6 14.2 15.2  HCT 46.0 46.8 42.1 46.0  MCV 90.7 91.9 90.3 91.3  PLT 148* 136* 115* 134*    Basic Metabolic Panel: Recent Labs  Lab 11/18/21 1220 11/18/21 1600 11/19/21 0327  NA 138 142 138  K 4.3 4.3 4.0  CL 104 100 104  CO2 26 26 26   GLUCOSE 142* 171* 189*  BUN 21 21 14   CREATININE 0.84 1.11 0.94  CALCIUM 9.3 9.8 8.4*  MG 2.1  --   --     GFR: Estimated Creatinine Clearance: 70.1 mL/min (by C-G formula based on SCr of 0.94 mg/dL).  Liver Function Tests: Recent Labs  Lab 11/18/21 1220 11/19/21 0327  AST 20 14*  ALT 21 16  ALKPHOS 71 64  BILITOT 1.6* 1.5*  PROT 7.2 6.0*  ALBUMIN 3.8 3.2*   No results for input(s): LIPASE, AMYLASE in the last 168 hours. No results for input(s): AMMONIA in the last 168 hours.  Coagulation Profile: No results for input(s): INR, PROTIME in the last 168 hours.  Cardiac Enzymes: No results for input(s): CKTOTAL, CKMB, CKMBINDEX, TROPONINI in the last 168 hours.  BNP (last 3 results) No results for input(s): PROBNP in the last 8760 hours.  Lipid Profile: No results for input(s): CHOL, HDL, LDLCALC, TRIG, CHOLHDL, LDLDIRECT in the last 72 hours.  Thyroid Function Tests: Recent Labs    11/18/21 1220  TSH 2.471    Anemia Panel: No results for input(s): VITAMINB12, FOLATE, FERRITIN, TIBC, IRON, RETICCTPCT in the last 72 hours.  Urine analysis: No results found for: COLORURINE, APPEARANCEUR, LABSPEC, PHURINE, GLUCOSEU, HGBUR, BILIRUBINUR, KETONESUR, PROTEINUR, UROBILINOGEN, NITRITE, LEUKOCYTESUR  Sepsis Labs: Lactic Acid, Venous    Component Value Date/Time   LATICACIDVEN 1.1 11/19/2021 0327    MICROBIOLOGY: Recent Results (from the past 240 hour(s))  Resp Panel by RT-PCR (Flu A&B, Covid) Nasopharyngeal Swab     Status: None   Collection Time:  11/18/21  3:59 PM   Specimen: Nasopharyngeal Swab; Nasopharyngeal(NP) swabs in vial transport medium  Result Value Ref Range Status   SARS Coronavirus 2 by RT PCR NEGATIVE NEGATIVE Final    Comment: (NOTE) SARS-CoV-2 target nucleic acids are NOT DETECTED.  The SARS-CoV-2 RNA is generally detectable in upper respiratory specimens during the acute phase of infection. The lowest concentration of SARS-CoV-2 viral copies this assay can detect is 138 copies/mL. A negative result does not preclude SARS-Cov-2 infection and should not be used as the sole basis for treatment or other patient management decisions. A negative result may occur with  improper specimen collection/handling, submission of specimen other than nasopharyngeal swab, presence of viral mutation(s) within the areas targeted by this assay, and inadequate number of viral copies(<138 copies/mL). A negative result must be combined with clinical observations, patient history, and epidemiological information. The expected result is Negative.  Fact Sheet for Patients:  EntrepreneurPulse.com.au  Fact Sheet for Healthcare Providers:  IncredibleEmployment.be  This test is no t yet approved or cleared by the  [place] Architectural technologist and  has been authorized for detection and/or diagnosis of SARS-CoV-2 by FDA under an Print production planner (EUA). This EUA will remain  in effect (meaning this test can be used) for the duration of the COVID-19 declaration under Section 564(b)(1) of the Act, 21 U.S.C.section 360bbb-3(b)(1), unless the authorization is terminated  or revoked sooner.       Influenza A by PCR NEGATIVE NEGATIVE Final   Influenza B by PCR NEGATIVE NEGATIVE Final    Comment: (NOTE) The Xpert Xpress SARS-CoV-2/FLU/RSV plus assay is intended as an aid in the diagnosis of influenza from Nasopharyngeal swab specimens and should not be used as a sole basis for treatment. Nasal washings  and aspirates are unacceptable for Xpert Xpress SARS-CoV-2/FLU/RSV testing.  Fact Sheet for Patients: EntrepreneurPulse.com.au  Fact Sheet for Healthcare Providers: IncredibleEmployment.be  This test is not yet approved or cleared by the [place] FDA and has been authorized for detection and/or diagnosis of SARS-CoV-2 by FDA under an Emergency Use Authorization (EUA). This EUA will remain in effect (meaning this test can be used) for the duration of the COVID-19 declaration under Section 564(b)(1) of the Act, 21 U.S.C. section 360bbb-3(b)(1), unless the authorization is terminated or revoked.  Performed at [facility], [place] [address] [place], [place] [address]   MRSA Next Gen by PCR, Nasal     Status: None   Collection Time: 11/18/21  7:55 PM   Specimen: Nasal Mucosa; Nasal Swab  Result Value Ref Range Status   MRSA by PCR Next Gen NOT DETECTED NOT DETECTED Final    Comment: (NOTE) The GeneXpert MRSA Assay (FDA approved for NASAL specimens only), is one component of a comprehensive MRSA colonization surveillance program. It is not intended to diagnose MRSA infection nor to guide or monitor treatment for MRSA infections. Test performance is not FDA approved in patients less than 21 years old. Performed at [facility], [place] [address] [place], [place] [address]     RADIOLOGY STUDIES/RESULTS: CT Angio Chest PE W/Cm &/Or Wo Cm  Result Date: 11/18/2021 CLINICAL DATA:  Short of breath, positive D-dimer EXAM: CT ANGIOGRAPHY CHEST WITH CONTRAST TECHNIQUE: Multidetector CT imaging of the chest was performed using the standard protocol during bolus administration of intravenous contrast. Multiplanar CT image reconstructions and MIPs were obtained to evaluate the vascular anatomy. RADIATION DOSE REDUCTION: This exam was performed according to the departmental dose-optimization program which includes automated exposure control,  adjustment of the mA and/or kV according to patient size and/or use of iterative reconstruction technique. CONTRAST:  158mL OMNIPAQUE IOHEXOL 350 MG/ML SOLN COMPARISON:  01/24/2020 FINDINGS: Cardiovascular: This is a technically adequate evaluation of the pulmonary vasculature. There are large bilateral pulmonary emboli involving the main pulmonary arteries and segmental branches. Moderate clot burden. There is straightening of the interventricular septum, with RV/LV ratio measuring 1.3 consistent with right heart strain. No pericardial effusion. No evidence of thoracic aortic aneurysm or dissection. Mild atherosclerosis of the aorta and coronary vasculature. Mediastinum/Nodes: No enlarged mediastinal, hilar, or axillary lymph nodes. Thyroid gland, trachea, and esophagus demonstrate no significant findings. Lungs/Pleura: No acute airspace disease, effusion, or pneumothorax. Central airways are patent. Upper Abdomen: No acute abnormality. Musculoskeletal: No acute or destructive bony lesions. Reconstructed images demonstrate no additional findings. Review of the MIP images confirms the above findings. IMPRESSION: 1. Large bilateral pulmonary emboli, with CT evidence of right heart strain (RV/LV Ratio = 1.3) consistent with at least submassive (intermediate risk) PE. The presence of  right heart strain has been associated with an increased risk of morbidity and mortality. Please refer to the "PE Focused" order set in EPIC. 2.  Aortic Atherosclerosis (ICD10-I70.0). Critical Value/emergent results were called by telephone at the time of interpretation on 11/18/2021 at 5:48 pm to provider [place] , who verbally acknowledged these results. Electronically Signed   By: Randa Ngo M.D.   On: 11/18/2021 17:51   ECHOCARDIOGRAM COMPLETE  Result Date: 11/19/2021    ECHOCARDIOGRAM REPORT   Patient Name:   John Mcmahon Date of Exam: 11/19/2021 Medical Rec #:  [id number]       Height:       70.0 in Accession #:     [id number]      Weight:       168.9 lb Date of Birth:  1946-09-16       BSA:          1.943 m Patient Age:    30 years        BP:           129/81 mmHg Patient Gender: M               HR:           72 bpm. Exam Location:  Inpatient Procedure: 2D Echo, Cardiac Doppler and Color Doppler Indications:    Pulmonary Embolus  History:        Patient has no prior history of Echocardiogram examinations.                 CAD; Risk Factors:Hypertension.  Sonographer:    Glo Herring Referring Phys: [place]  1. Left ventricular ejection fraction, by estimation, is 60 to 65%. The left ventricle has normal function. The left ventricle has no regional wall motion abnormalities. Left ventricular diastolic parameters are indeterminate.  2. Right ventricular systolic function is normal. The right ventricular size is normal. Tricuspid regurgitation signal is inadequate for assessing PA pressure.  3. The mitral valve was not well visualized. No evidence of mitral valve regurgitation.  4. The aortic valve was not well visualized. Aortic valve regurgitation is trivial.  5. The inferior vena cava is normal in size with greater than 50% respiratory variability, suggesting right atrial pressure of 3 mmHg. Comparison(s): No prior Echocardiogram. FINDINGS  Left Ventricle: Left ventricular ejection fraction, by estimation, is 60 to 65%. The left ventricle has normal function. The left ventricle has no regional wall motion abnormalities. The left ventricular internal cavity size was normal in size. There is  no left ventricular hypertrophy. Left ventricular diastolic parameters are indeterminate. Right Ventricle: The right ventricular size is normal. No increase in right ventricular wall thickness. Right ventricular systolic function is normal. Tricuspid regurgitation signal is inadequate for assessing PA pressure. Left Atrium: Left atrial size was normal in size. Right Atrium: Right atrial size was normal in size.  Pericardium: There is no evidence of pericardial effusion. Mitral Valve: The mitral valve was not well visualized. No evidence of mitral valve regurgitation. Tricuspid Valve: The tricuspid valve is not well visualized. Tricuspid valve regurgitation is not demonstrated. Aortic Valve: The aortic valve was not well visualized. Aortic valve regurgitation is trivial. Aortic valve mean gradient measures 3.5 mmHg. Aortic valve peak gradient measures 5.9 mmHg. Aortic valve area, by VTI measures 2.44 cm. Pulmonic Valve: The pulmonic valve was not well visualized. Aorta: The aortic root and ascending aorta are structurally normal, with no evidence of dilitation. Venous: The inferior vena  cava is normal in size with greater than 50% respiratory variability, suggesting right atrial pressure of 3 mmHg. IAS/Shunts: The interatrial septum was not well visualized.  LEFT VENTRICLE PLAX 2D LVOT diam:     2.00 cm   Diastology LV SV:         58        LV e' medial:  4.46 cm/s LV SV Index:   30        LV e' lateral: 6.96 cm/s LVOT Area:     3.14 cm  RIGHT VENTRICLE             IVC RV Basal diam:  3.60 cm     IVC diam: 1.90 cm RV Mid diam:    2.60 cm RV S prime:     14.60 cm/s LEFT ATRIUM             Index        RIGHT ATRIUM           Index LA Vol (A2C):   48.4 ml 24.92 ml/m  RA Area:     15.30 cm LA Vol (A4C):   44.8 ml 23.06 ml/m  RA Volume:   35.30 ml  18.17 ml/m LA Biplane Vol: 47.2 ml 24.30 ml/m  AORTIC VALVE                    PULMONIC VALVE AV Area (Vmax):    2.52 cm     PV Vmax:       0.73 m/s AV Area (Vmean):   2.54 cm     PV Peak grad:  2.1 mmHg AV Area (VTI):     2.44 cm AV Vmax:           121.00 cm/s AV Vmean:          84.400 cm/s AV VTI:            0.237 m AV Peak Grad:      5.9 mmHg AV Mean Grad:      3.5 mmHg LVOT Vmax:         96.90 cm/s LVOT Vmean:        68.300 cm/s LVOT VTI:          0.184 m LVOT/AV VTI ratio: 0.78  AORTA Ao Root diam: 3.90 cm Ao Asc diam:  3.60 cm  SHUNTS Systemic VTI:  0.18 m Systemic  Diam: 2.00 cm Phineas Inches Electronically signed by Phineas Inches Signature Date/Time: 11/19/2021/10:30:45 AM    Final    VAS [place] LOWER EXTREMITY VENOUS (DVT) (ONLY [facility] & [facility])  Result Date: 11/19/2021  Lower Venous DVT Study Patient Name:  John Mcmahon  Date of Exam:   11/18/2021 Medical Rec #: [id number]      Accession #:    [id number] Date of Birth: 04-16-1946      Patient Gender: M Patient Age:   49 years Exam Location:  [facility] Procedure:      VAS [place] LOWER EXTREMITY VENOUS (DVT) Referring Phys: Marda Stalker --------------------------------------------------------------------------------  Indications: Pulmonary embolism.  Risk Factors: Confirmed PE. Anticoagulation: Heparin. Comparison Study: No prior studies. Performing Technologist: Oliver Hum RVT  Examination Guidelines: A complete evaluation includes B-mode imaging, spectral Doppler, color Doppler, and power Doppler as needed of all accessible portions of each vessel. Bilateral testing is considered an integral part of a complete examination. Limited examinations for reoccurring indications may be performed as noted. The reflux portion of the exam is performed with the patient  in reverse Trendelenburg.  +---------+---------------+---------+-----------+----------+-----------------+  RIGHT     Compressibility Phasicity Spontaneity Properties Thrombus Aging     +---------+---------------+---------+-----------+----------+-----------------+  CFV       Full            Yes       Yes                                       +---------+---------------+---------+-----------+----------+-----------------+  SFJ       Full                                                                +---------+---------------+---------+-----------+----------+-----------------+  FV Prox   Partial         Yes       Yes                    Age Indeterminate  +---------+---------------+---------+-----------+----------+-----------------+  FV Mid    Partial         Yes       Yes                     Age Indeterminate  +---------+---------------+---------+-----------+----------+-----------------+  FV Distal None            No        No                     Age Indeterminate  +---------+---------------+---------+-----------+----------+-----------------+  PFV       Full                                                                +---------+---------------+---------+-----------+----------+-----------------+  POP       Full            Yes       Yes                                       +---------+---------------+---------+-----------+----------+-----------------+  PTV       Full                                                                +---------+---------------+---------+-----------+----------+-----------------+  PERO      Partial                                          Age Indeterminate  +---------+---------------+---------+-----------+----------+-----------------+   +---------+---------------+---------+-----------+----------+--------------+  LEFT      Compressibility Phasicity Spontaneity Properties Thrombus Aging  +---------+---------------+---------+-----------+----------+--------------+  CFV       Full  Yes       Yes                                    +---------+---------------+---------+-----------+----------+--------------+  SFJ       Full                                                             +---------+---------------+---------+-----------+----------+--------------+  FV Prox   Full                                                             +---------+---------------+---------+-----------+----------+--------------+  FV Mid    Full                                                             +---------+---------------+---------+-----------+----------+--------------+  FV Distal Full                                                             +---------+---------------+---------+-----------+----------+--------------+  PFV       Full                                                              +---------+---------------+---------+-----------+----------+--------------+  POP       Full            Yes       Yes                                    +---------+---------------+---------+-----------+----------+--------------+  PTV       Full                                                             +---------+---------------+---------+-----------+----------+--------------+  PERO      Full                                                             +---------+---------------+---------+-----------+----------+--------------+     Summary: RIGHT: - Findings consistent with age indeterminate  deep vein thrombosis involving the right femoral vein, and right peroneal veins. - No cystic structure found in the popliteal fossa.  LEFT: - There is no evidence of deep vein thrombosis in the lower extremity.  - No cystic structure found in the popliteal fossa.  *See table(s) above for measurements and observations. Electronically signed by Orlie Pollen on 11/19/2021 at 8:42:50 AM.    Final      LOS: 2 days   Oren Binet, MD  [facility]    To contact the attending provider between 7A-7P or the covering provider during after hours 7P-7A, please log into the web site [email or web address] and access using universal [place] password for that web site. If you do not have the password, please call the hospital operator.  11/20/2021, 10:48 AM

## 2021-11-20 NOTE — Assessment & Plan Note (Signed)
Continue outpatient surveillance post discharge.

## 2021-11-20 NOTE — Progress Notes (Signed)
OT Cancellation Note  Patient Details Name: John Mcmahon [name]: [id number] DOB: 1945-12-16   Cancelled Treatment:    Reason Eval/Treat Not Completed: OT screened, no needs identified, will sign off.  Observed pt working with PT.  No needs identified.  Nilsa Nutting., OTR/L Acute Rehabilitation Services Pager [phone] Office [phone]   Lucille Passy M 11/20/2021, 12:52 PM

## 2021-11-21 LAB — CBC
HCT: 43.4 % (ref 39.0–52.0)
Hemoglobin: 14.4 g/dL (ref 13.0–17.0)
MCH: 30 pg (ref 26.0–34.0)
MCHC: 33.2 g/dL (ref 30.0–36.0)
MCV: 90.4 fL (ref 80.0–100.0)
Platelets: 147 10*3/uL — ABNORMAL LOW (ref 150–400)
RBC: 4.8 MIL/uL (ref 4.22–5.81)
RDW: 12.3 % (ref 11.5–15.5)
WBC: 5.9 10*3/uL (ref 4.0–10.5)
nRBC: 0 % (ref 0.0–0.2)

## 2021-11-21 MED ORDER — APIXABAN 5 MG PO TABS: ORAL_TABLET | ORAL | 3 refills | Status: DC

## 2021-11-21 NOTE — Progress Notes (Signed)
DISCHARGE NOTE HOME John Mcmahon to be discharged Home per MD order. Discussed prescriptions and follow up appointments with the patient. Prescriptions given to patient; medication list explained in detail. Patient verbalized understanding.  Skin clean, dry and intact without evidence of skin break down, no evidence of skin tears noted. IV catheter discontinued intact. Site without signs and symptoms of complications. Dressing and pressure applied. Pt denies pain at the site currently. No complaints noted.  Patient free of lines, drains, and wounds.   An After Visit Summary (AVS) was printed and given to the patient. Patient escorted via wheelchair, and discharged home via private auto.  Velia Meyer, RN

## 2021-11-21 NOTE — Plan of Care (Signed)

## 2021-11-21 NOTE — TOC Transition Note (Signed)
Transition of Care [facility]) - CM/SW Discharge Note   Patient Details  Name: John Mcmahon [name]: [id number] Date of Birth: 09/25/1946  [facility] CM/SW Contact:  Bess Kinds, RN Phone Number: [phone] 11/21/2021, 9:43 AM   Clinical Narrative:     Notified by MD of patient transitioning home today with new prescription for Eliquis. Will provide 30 day free trial card. No further TOC needs identified at this time.   Final next level of care: Home/Self Care Barriers to Discharge: No Barriers Identified   Patient Goals and CMS Choice        Discharge Placement                       Discharge Plan and Services                                     Social Determinants of Health (SDOH) Interventions     Readmission Risk Interventions No flowsheet data found.

## 2021-11-21 NOTE — TOC Transition Note (Signed)
Transition of Care [facility]) - CM/SW Discharge Note   Patient Details  Name: John Mcmahon [name]: [id number] Date of Birth: 1945-12-21  [facility] CM/SW Contact:  Bess Kinds, RN Phone Number: [phone] 11/21/2021, 11:31 AM   Clinical Narrative:     Spoke with patient and spouse at the bedside to discuss  transition home today with new prescription for Eliquis. Provided 30-day free trial card. Patient expressed appreciation. No further TOC  needs identified.   Final next level of care: Home/Self Care Barriers to Discharge: No Barriers Identified   Patient Goals and CMS Choice        Discharge Placement                       Discharge Plan and Services                                     Social Determinants of Health (SDOH) Interventions     Readmission Risk Interventions No flowsheet data found.

## 2021-11-21 NOTE — Discharge Summary (Signed)
PATIENT DETAILS Name: John Mcmahon Age: 76 y.o. Sex: male Date of Birth: 1946-08-21 [name]: [id number]. Admitting Physician: Chesley Mires, MD [id number], Emily Filbert., MD  Admit Date: 11/18/2021 Discharge date: 11/21/2021  Recommendations for Outpatient Follow-up:  Follow up with PCP in 1-2 weeks Please obtain CMP/CBC in one week New medication-Eliquis Please ensure outpatient follow-up with hematology Continue outpatient surveillance of thoracic aortic aneurysm  Admitted From:  Home  Disposition: [place]: No  Equipment/Devices: None  Discharge Condition: Stable  CODE STATUS: FULL CODE  Diet recommendation:  Diet Order             Diet - low sodium heart healthy           Diet Heart Room service appropriate? Yes; Fluid consistency: Thin  Diet effective now                    Brief Summary: Patient is a 76 y.o. male with history of nonobstructive CAD, HTN, HLD, thoracic aortic aneurysm-who presented with presyncope/shortness of breath-2 days prior to this hospitalization.  EMS arrived on the scene-provided supportive care-and since this was a self-limited episode-patient was not transported to the ED.  Patient subsequently went to cardiology office for evaluation of this episode-and was found to have significantly elevated D-dimers.  Patient was then referred to the ED where he was found to have a submassive pulmonary embolism.  He was started on IV heparin-monitored in the [facility] subsequently transferred to the [facility] hospitalist service on 1/21.  Brief Hospital Course: * Submassive pulmonary embolism with age-indeterminate DVT involving right femoral and peroneal veins- (present on admission) Initially admitted to the [facility] started on IV heparin.  Did not require thrombolytics.  Given hemodynamic stability-was transitioned to Eliquis on 1/21 (risk/benefits/alternatives discussed at bedside).  Given unprovoked submassive PE-probably will benefit  from indefinite anticoagulation.  Has tolerated initiation of Eliquis overnight without any major issues-in fact ambulated around 450 feet with physical therapy-he did not have any major shortness of breath.  Plan is to discharge home on oral anticoagulation-patient will need outpatient evaluation with hematology (will send epic referral)   Coronary artery disease of native artery of native heart with stable angina pectoris ([place])- (present on admission) No anginal symptoms-probably can discontinue aspirin as patient will be on full dose anticoagulation.  I have asked patient to get in touch with his cardiologist over the next few days to see if he can stay off aspirin indefinitely.  Hyperlipidemia LDL goal <70- (present on admission) Resume Repatha injections post discharge.  Thoracic aortic aneurysm without rupture- (present on admission) Continue outpatient surveillance post discharge.  BMI: Estimated body mass index is 24.23 kg/m as calculated from the following:   Height as of this encounter: 5\' 10"  (1.778 m).   Weight as of this encounter: 76.6 kg.    Procedures PCCM  Discharge Diagnoses:  Principal Problem:   Submassive pulmonary embolism with age-indeterminate DVT involving right femoral and peroneal veins Active Problems:   Coronary artery disease of native artery of native heart with stable angina pectoris ([place])   Hyperlipidemia LDL goal <70   Thoracic aortic aneurysm without rupture   Discharge Instructions:  Activity:  As tolerated   Discharge Instructions     Ambulatory referral to Hematology / Oncology   Complete by: As directed    Unprovoked massive pulmonary embolism-needs outpatient hematology evaluation for hypercoagulable state.   Call MD for:  difficulty breathing, headache or visual disturbances  Complete by: As directed    Diet - low sodium heart healthy   Complete by: As directed    Discharge instructions   Complete by: As directed    Follow with  Primary MD  [name], MD in 1-2 weeks  Please get a complete blood count and chemistry panel checked by your Primary MD at your next visit, and again as instructed by your Primary MD.  Get Medicines reviewed and adjusted: Please take all your medications with you for your next visit with your Primary MD  Laboratory/radiological data: Please request your Primary MD to go over all hospital tests and procedure/radiological results at the follow up, please ask your Primary MD to get all Hospital records sent to his/her office.  In some cases, they will be blood work, cultures and biopsy results pending at the time of your discharge. Please request that your primary care M.D. follows up on these results.  Also Note the following: If you experience worsening of your admission symptoms, develop shortness of breath, life threatening emergency, suicidal or homicidal thoughts you must seek medical attention immediately by calling 911 or calling your MD immediately  if symptoms less severe.  You must read complete instructions/literature along with all the possible adverse reactions/side effects for all the Medicines you take and that have been prescribed to you. Take any new Medicines after you have completely understood and accpet all the possible adverse reactions/side effects.   Do not drive when taking Pain medications or sleeping medications (Benzodaizepines)  Do not take more than prescribed Pain, Sleep and Anxiety Medications. It is not advisable to combine anxiety,sleep and pain medications without talking with your primary care practitioner  Special Instructions: If you have smoked or chewed Tobacco  in the last 2 yrs please stop smoking, stop any regular Alcohol  and or any Recreational drug use.  Wear Seat belts while driving.  Please note: You were cared for by a hospitalist during your hospital stay. Once you are discharged, your primary care physician will handle any further  medical issues. Please note that NO REFILLS for any discharge medications will be authorized once you are discharged, as it is imperative that you return to your primary care physician (or establish a relationship with a primary care physician if you do not have one) for your post hospital discharge needs so that they can reassess your need for medications and monitor your lab values.   Increase activity slowly   Complete by: As directed       Allergies as of 11/21/2021       Reactions   Other Swelling, Other (See Comments)   Thiomersal (in solution for contact lenses) = Red and swollen eyes   Atorvastatin Other (See Comments)   Insomnia        Medication List     STOP taking these medications    Advil 200 MG Caps Generic drug: Ibuprofen   Advil PM 200-25 MG Caps Generic drug: Ibuprofen-diphenhydrAMINE HCl   aspirin EC 81 MG tablet       TAKE these medications    amoxicillin 500 MG capsule Commonly known as: AMOXIL Take 2,000 mg by mouth See admin instructions. Take 2,000 mg by mouth one hour prior to dental treatment or as otherwise instructed   apixaban 5 MG Tabs tablet Commonly known as: ELIQUIS Take 2 tablets (10 mg) twice daily, on 11/27/21 switch to 1 tablet (5 mg) twice daily.   bisacodyl 5 MG EC tablet Commonly  known as: DULCOLAX Take 5 mg by mouth daily as needed for mild constipation or moderate constipation.   nitroGLYCERIN 0.4 MG SL tablet Commonly known as: NITROSTAT Place 1 tablet (0.4 mg total) under the tongue every 5 (five) minutes as needed for chest pain.   Repatha SureClick [id number] MG/ML [place] Generic drug: Evolocumab INJECT ONE PEN INTO THE SKIN EVERY 14 DAYS What changed: See the new instructions.        Follow-up Information     [name], MD Follow up in 1 week(s).   Specialty: Internal Medicine Contact information: [place] [address] [phone]         Nelva Bush, MD Follow up in 1 week(s).    Specialty: Cardiology Contact information: [place] [address] [phone]         [facility] Follow up.   Specialty: Oncology Why: Office will call with date/time, If you dont hear from them,please give them a call Contact information: [place] [id number] [place] [id number] [phone]               Allergies  Allergen Reactions   Other Swelling and Other (See Comments)    Thiomersal (in solution for contact lenses) = Red and swollen eyes    Atorvastatin Other (See Comments)    Insomnia      Consultations:  pulmonary/intensive care   Other Procedures/Studies: CT Angio Chest PE W/Cm &/Or Wo Cm  Result Date: 11/18/2021 CLINICAL DATA:  Short of breath, positive D-dimer EXAM: CT ANGIOGRAPHY CHEST WITH CONTRAST TECHNIQUE: Multidetector CT imaging of the chest was performed using the standard protocol during bolus administration of intravenous contrast. Multiplanar CT image reconstructions and MIPs were obtained to evaluate the vascular anatomy. RADIATION DOSE REDUCTION: This exam was performed according to the departmental dose-optimization program which includes automated exposure control, adjustment of the mA and/or kV according to patient size and/or use of iterative reconstruction technique. CONTRAST:  143mL OMNIPAQUE IOHEXOL 350 MG/ML SOLN COMPARISON:  01/24/2020 FINDINGS: Cardiovascular: This is a technically adequate evaluation of the pulmonary vasculature. There are large bilateral pulmonary emboli involving the main pulmonary arteries and segmental branches. Moderate clot burden. There is straightening of the interventricular septum, with RV/LV ratio measuring 1.3 consistent with right heart strain. No pericardial effusion. No evidence of thoracic aortic aneurysm or dissection. Mild atherosclerosis of the aorta and coronary vasculature. Mediastinum/Nodes: No enlarged  mediastinal, hilar, or axillary lymph nodes. Thyroid gland, trachea, and esophagus demonstrate no significant findings. Lungs/Pleura: No acute airspace disease, effusion, or pneumothorax. Central airways are patent. Upper Abdomen: No acute abnormality. Musculoskeletal: No acute or destructive bony lesions. Reconstructed images demonstrate no additional findings. Review of the MIP images confirms the above findings. IMPRESSION: 1. Large bilateral pulmonary emboli, with CT evidence of right heart strain (RV/LV Ratio = 1.3) consistent with at least submassive (intermediate risk) PE. The presence of right heart strain has been associated with an increased risk of morbidity and mortality. Please refer to the "PE Focused" order set in EPIC. 2.  Aortic Atherosclerosis (ICD10-I70.0). Critical Value/emergent results were called by telephone at the time of interpretation on 11/18/2021 at 5:48 pm to provider [place] , who verbally acknowledged these results. Electronically Signed   By: Randa Ngo M.D.   On: 11/18/2021 17:51   ECHOCARDIOGRAM COMPLETE  Result Date: 11/19/2021    ECHOCARDIOGRAM REPORT   Patient Name:   HAWTHORN PATI Date  of Exam: 11/19/2021 Medical Rec #:  [id number]       Height:       70.0 in Accession #:    [id number]      Weight:       168.9 lb Date of Birth:  21-Dec-1945       BSA:          1.943 m Patient Age:    43 years        BP:           129/81 mmHg Patient Gender: M               HR:           72 bpm. Exam Location:  Inpatient Procedure: 2D Echo, Cardiac Doppler and Color Doppler Indications:    Pulmonary Embolus  History:        Patient has no prior history of Echocardiogram examinations.                 CAD; Risk Factors:Hypertension.  Sonographer:    Glo Herring Referring Phys: [place]  1. Left ventricular ejection fraction, by estimation, is 60 to 65%. The left ventricle has normal function. The left ventricle has no regional wall motion abnormalities. Left  ventricular diastolic parameters are indeterminate.  2. Right ventricular systolic function is normal. The right ventricular size is normal. Tricuspid regurgitation signal is inadequate for assessing PA pressure.  3. The mitral valve was not well visualized. No evidence of mitral valve regurgitation.  4. The aortic valve was not well visualized. Aortic valve regurgitation is trivial.  5. The inferior vena cava is normal in size with greater than 50% respiratory variability, suggesting right atrial pressure of 3 mmHg. Comparison(s): No prior Echocardiogram. FINDINGS  Left Ventricle: Left ventricular ejection fraction, by estimation, is 60 to 65%. The left ventricle has normal function. The left ventricle has no regional wall motion abnormalities. The left ventricular internal cavity size was normal in size. There is  no left ventricular hypertrophy. Left ventricular diastolic parameters are indeterminate. Right Ventricle: The right ventricular size is normal. No increase in right ventricular wall thickness. Right ventricular systolic function is normal. Tricuspid regurgitation signal is inadequate for assessing PA pressure. Left Atrium: Left atrial size was normal in size. Right Atrium: Right atrial size was normal in size. Pericardium: There is no evidence of pericardial effusion. Mitral Valve: The mitral valve was not well visualized. No evidence of mitral valve regurgitation. Tricuspid Valve: The tricuspid valve is not well visualized. Tricuspid valve regurgitation is not demonstrated. Aortic Valve: The aortic valve was not well visualized. Aortic valve regurgitation is trivial. Aortic valve mean gradient measures 3.5 mmHg. Aortic valve peak gradient measures 5.9 mmHg. Aortic valve area, by VTI measures 2.44 cm. Pulmonic Valve: The pulmonic valve was not well visualized. Aorta: The aortic root and ascending aorta are structurally normal, with no evidence of dilitation. Venous: The inferior vena cava is normal in  size with greater than 50% respiratory variability, suggesting right atrial pressure of 3 mmHg. IAS/Shunts: The interatrial septum was not well visualized.  LEFT VENTRICLE PLAX 2D LVOT diam:     2.00 cm   Diastology LV SV:         58        LV e' medial:  4.46 cm/s LV SV Index:   30        LV e' lateral: 6.96 cm/s LVOT Area:     3.14 cm  RIGHT VENTRICLE             IVC RV Basal diam:  3.60 cm     IVC diam: 1.90 cm RV Mid diam:    2.60 cm RV S prime:     14.60 cm/s LEFT ATRIUM             Index        RIGHT ATRIUM           Index LA Vol (A2C):   48.4 ml 24.92 ml/m  RA Area:     15.30 cm LA Vol (A4C):   44.8 ml 23.06 ml/m  RA Volume:   35.30 ml  18.17 ml/m LA Biplane Vol: 47.2 ml 24.30 ml/m  AORTIC VALVE                    PULMONIC VALVE AV Area (Vmax):    2.52 cm     PV Vmax:       0.73 m/s AV Area (Vmean):   2.54 cm     PV Peak grad:  2.1 mmHg AV Area (VTI):     2.44 cm AV Vmax:           121.00 cm/s AV Vmean:          84.400 cm/s AV VTI:            0.237 m AV Peak Grad:      5.9 mmHg AV Mean Grad:      3.5 mmHg LVOT Vmax:         96.90 cm/s LVOT Vmean:        68.300 cm/s LVOT VTI:          0.184 m LVOT/AV VTI ratio: 0.78  AORTA Ao Root diam: 3.90 cm Ao Asc diam:  3.60 cm  SHUNTS Systemic VTI:  0.18 m Systemic Diam: 2.00 cm Phineas Inches Electronically signed by Phineas Inches Signature Date/Time: 11/19/2021/10:30:45 AM    Final    XR Knee 1-2 Views Right  Result Date: 10/28/2021 2 views of the right knee show no acute findings.  There is slight medial joint space narrowing and calcifications around the medial and lateral meniscus.  There is slight varus malalignment.  VAS [place] LOWER EXTREMITY VENOUS (DVT) (ONLY [facility] & [facility])  Result Date: 11/19/2021  Lower Venous DVT Study Patient Name:  JSAON PLAZOLA  Date of Exam:   11/18/2021 Medical Rec #: [id number]      Accession #:    [id number] Date of Birth: [birth year]      Patient Gender: M Patient Age:   80 years Exam Location:  [facility] Procedure:      VAS  [place] LOWER EXTREMITY VENOUS (DVT) Referring Phys: Marda Stalker --------------------------------------------------------------------------------  Indications: Pulmonary embolism.  Risk Factors: Confirmed PE. Anticoagulation: Heparin. Comparison Study: No prior studies. Performing Technologist: Oliver Hum RVT  Examination Guidelines: A complete evaluation includes B-mode imaging, spectral Doppler, color Doppler, and power Doppler as needed of all accessible portions of each vessel. Bilateral testing is considered an integral part of a complete examination. Limited examinations for reoccurring indications may be performed as noted. The reflux portion of the exam is performed with the patient in reverse Trendelenburg.  +---------+---------------+---------+-----------+----------+-----------------+  RIGHT     Compressibility Phasicity Spontaneity Properties Thrombus Aging     +---------+---------------+---------+-----------+----------+-----------------+  CFV       Full            Yes       Yes                                       +---------+---------------+---------+-----------+----------+-----------------+  SFJ       Full                                                                +---------+---------------+---------+-----------+----------+-----------------+  FV Prox   Partial         Yes       Yes                    Age Indeterminate  +---------+---------------+---------+-----------+----------+-----------------+  FV Mid    Partial         Yes       Yes                    Age Indeterminate  +---------+---------------+---------+-----------+----------+-----------------+  FV Distal None            No        No                     Age Indeterminate  +---------+---------------+---------+-----------+----------+-----------------+  PFV       Full                                                                +---------+---------------+---------+-----------+----------+-----------------+  POP       Full             Yes       Yes                                       +---------+---------------+---------+-----------+----------+-----------------+  PTV       Full                                                                +---------+---------------+---------+-----------+----------+-----------------+  PERO      Partial                                          Age Indeterminate  +---------+---------------+---------+-----------+----------+-----------------+   +---------+---------------+---------+-----------+----------+--------------+  LEFT      Compressibility Phasicity Spontaneity Properties Thrombus Aging  +---------+---------------+---------+-----------+----------+--------------+  CFV       Full            Yes       Yes                                    +---------+---------------+---------+-----------+----------+--------------+  SFJ       Full                                                             +---------+---------------+---------+-----------+----------+--------------+  FV Prox   Full                                                             +---------+---------------+---------+-----------+----------+--------------+  FV Mid    Full                                                             +---------+---------------+---------+-----------+----------+--------------+  FV Distal Full                                                             +---------+---------------+---------+-----------+----------+--------------+  PFV       Full                                                             +---------+---------------+---------+-----------+----------+--------------+  POP       Full            Yes       Yes                                    +---------+---------------+---------+-----------+----------+--------------+  PTV       Full                                                             +---------+---------------+---------+-----------+----------+--------------+  PERO      Full                                                              +---------+---------------+---------+-----------+----------+--------------+     Summary: RIGHT: - Findings consistent with age indeterminate deep vein thrombosis involving the right femoral vein, and right peroneal veins. - No cystic structure found in the popliteal fossa.  LEFT: - There is no evidence of deep vein thrombosis in the lower extremity.  - No cystic structure found in the popliteal fossa.  *See table(s) above for measurements and observations. Electronically signed by Orlie Pollen on 11/19/2021 at 8:42:50 AM.    Final      TODAY-DAY OF DISCHARGE:  Subjective:   John Mcmahon today has no headache,no chest abdominal pain,no new weakness tingling or numbness, feels much better wants to go home today.   Objective:   Blood pressure 115/87, pulse 98, temperature 98.6 F (  37 C), resp. rate 18, height 5\' 10"  (1.778 m), weight 76.6 kg, SpO2 94 %.  Intake/Output Summary (Last 24 hours) at 11/21/2021 0928 Last data filed at 11/21/2021 0850 Gross per 24 hour  Intake 806.83 ml  Output 300 ml  Net 506.83 ml   Filed Weights   11/18/21 1601 11/18/21 2230  Weight: 76.6 kg 76.6 kg    Exam: Awake Alert, Oriented *3, No new F.N deficits, Normal affect [place].AT,PERRAL Supple Neck,No JVD, No cervical lymphadenopathy appriciated.  Symmetrical Chest wall movement, Good air movement bilaterally, CTAB RRR,No Gallops,Rubs or new Murmurs, No Parasternal Heave +ve B.Sounds, Abd Soft, Non tender, No organomegaly appriciated, No rebound -guarding or rigidity. No Cyanosis, Clubbing or edema, No new Rash or bruise   PERTINENT RADIOLOGIC STUDIES: ECHOCARDIOGRAM COMPLETE  Result Date: 11/19/2021    ECHOCARDIOGRAM REPORT   Patient Name:   ADREW DUNST Date of Exam: 11/19/2021 Medical Rec #:  [id number]       Height:       70.0 in Accession #:    [id number]      Weight:       168.9 lb Date of Birth:  09-27-1946       BSA:          1.943 m Patient Age:    76 years        BP:            129/81 mmHg Patient Gender: M               HR:           72 bpm. Exam Location:  Inpatient Procedure: 2D Echo, Cardiac Doppler and Color Doppler Indications:    Pulmonary Embolus  History:        Patient has no prior history of Echocardiogram examinations.                 CAD; Risk Factors:Hypertension.  Sonographer:    Glo Herring Referring Phys: [place]  1. Left ventricular ejection fraction, by estimation, is 60 to 65%. The left ventricle has normal function. The left ventricle has no regional wall motion abnormalities. Left ventricular diastolic parameters are indeterminate.  2. Right ventricular systolic function is normal. The right ventricular size is normal. Tricuspid regurgitation signal is inadequate for assessing PA pressure.  3. The mitral valve was not well visualized. No evidence of mitral valve regurgitation.  4. The aortic valve was not well visualized. Aortic valve regurgitation is trivial.  5. The inferior vena cava is normal in size with greater than 50% respiratory variability, suggesting right atrial pressure of 3 mmHg. Comparison(s): No prior Echocardiogram. FINDINGS  Left Ventricle: Left ventricular ejection fraction, by estimation, is 60 to 65%. The left ventricle has normal function. The left ventricle has no regional wall motion abnormalities. The left ventricular internal cavity size was normal in size. There is  no left ventricular hypertrophy. Left ventricular diastolic parameters are indeterminate. Right Ventricle: The right ventricular size is normal. No increase in right ventricular wall thickness. Right ventricular systolic function is normal. Tricuspid regurgitation signal is inadequate for assessing PA pressure. Left Atrium: Left atrial size was normal in size. Right Atrium: Right atrial size was normal in size. Pericardium: There is no evidence of pericardial effusion. Mitral Valve: The mitral valve was not well visualized. No evidence of mitral valve  regurgitation. Tricuspid Valve: The tricuspid valve is not well visualized. Tricuspid valve regurgitation is not demonstrated. Aortic Valve: The aortic  valve was not well visualized. Aortic valve regurgitation is trivial. Aortic valve mean gradient measures 3.5 mmHg. Aortic valve peak gradient measures 5.9 mmHg. Aortic valve area, by VTI measures 2.44 cm. Pulmonic Valve: The pulmonic valve was not well visualized. Aorta: The aortic root and ascending aorta are structurally normal, with no evidence of dilitation. Venous: The inferior vena cava is normal in size with greater than 50% respiratory variability, suggesting right atrial pressure of 3 mmHg. IAS/Shunts: The interatrial septum was not well visualized.  LEFT VENTRICLE PLAX 2D LVOT diam:     2.00 cm   Diastology LV SV:         58        LV e' medial:  4.46 cm/s LV SV Index:   30        LV e' lateral: 6.96 cm/s LVOT Area:     3.14 cm  RIGHT VENTRICLE             IVC RV Basal diam:  3.60 cm     IVC diam: 1.90 cm RV Mid diam:    2.60 cm RV S prime:     14.60 cm/s LEFT ATRIUM             Index        RIGHT ATRIUM           Index LA Vol (A2C):   48.4 ml 24.92 ml/m  RA Area:     15.30 cm LA Vol (A4C):   44.8 ml 23.06 ml/m  RA Volume:   35.30 ml  18.17 ml/m LA Biplane Vol: 47.2 ml 24.30 ml/m  AORTIC VALVE                    PULMONIC VALVE AV Area (Vmax):    2.52 cm     PV Vmax:       0.73 m/s AV Area (Vmean):   2.54 cm     PV Peak grad:  2.1 mmHg AV Area (VTI):     2.44 cm AV Vmax:           121.00 cm/s AV Vmean:          84.400 cm/s AV VTI:            0.237 m AV Peak Grad:      5.9 mmHg AV Mean Grad:      3.5 mmHg LVOT Vmax:         96.90 cm/s LVOT Vmean:        68.300 cm/s LVOT VTI:          0.184 m LVOT/AV VTI ratio: 0.78  AORTA Ao Root diam: 3.90 cm Ao Asc diam:  3.60 cm  SHUNTS Systemic VTI:  0.18 m Systemic Diam: 2.00 cm Phineas Inches Electronically signed by Phineas Inches Signature Date/Time: 11/19/2021/10:30:45 AM    Final      PERTINENT LAB  RESULTS: CBC: Recent Labs    11/20/21 0551 11/21/21 0412  WBC 6.0 5.9  HGB 15.2 14.4  HCT 46.0 43.4  PLT 134* 147*   CMET CMP     Component Value Date/Time   NA 138 11/19/2021 0327   NA 139 05/27/2021 1016   K 4.0 11/19/2021 0327   CL 104 11/19/2021 0327   CO2 26 11/19/2021 0327   GLUCOSE 189 (H) 11/19/2021 0327   BUN 14 11/19/2021 0327   BUN 18 05/27/2021 1016   CREATININE 0.94 11/19/2021 0327   CALCIUM 8.4 (L) 11/19/2021 0327  PROT 6.0 (L) 11/19/2021 0327   PROT 6.7 05/27/2021 1016   ALBUMIN 3.2 (L) 11/19/2021 0327   ALBUMIN 4.4 05/27/2021 1016   AST 14 (L) 11/19/2021 0327   ALT 16 11/19/2021 0327   ALKPHOS 64 11/19/2021 0327   BILITOT 1.5 (H) 11/19/2021 0327   BILITOT 1.1 05/27/2021 1016   GFRNONAA >60 11/19/2021 0327   GFRAA >60 12/15/2018 1316    GFR Estimated Creatinine Clearance: 70.1 mL/min (by C-G formula based on SCr of 0.94 mg/dL). No results for input(s): LIPASE, AMYLASE in the last 72 hours. No results for input(s): CKTOTAL, CKMB, CKMBINDEX, TROPONINI in the last 72 hours. Invalid input(s): POCBNP Recent Labs    11/18/21 1220  DDIMER 10.04*   No results for input(s): HGBA1C in the last 72 hours. No results for input(s): CHOL, HDL, LDLCALC, TRIG, CHOLHDL, LDLDIRECT in the last 72 hours. Recent Labs    11/18/21 1220  TSH 2.471   No results for input(s): VITAMINB12, FOLATE, FERRITIN, TIBC, IRON, RETICCTPCT in the last 72 hours. Coags: No results for input(s): INR in the last 72 hours.  Invalid input(s): PT Microbiology: Recent Results (from the past 240 hour(s))  Resp Panel by RT-PCR (Flu A&B, Covid) Nasopharyngeal Swab     Status: None   Collection Time: 11/18/21  3:59 PM   Specimen: Nasopharyngeal Swab; Nasopharyngeal(NP) swabs in vial transport medium  Result Value Ref Range Status   SARS Coronavirus 2 by RT PCR NEGATIVE NEGATIVE Final    Comment: (NOTE) SARS-CoV-2 target nucleic acids are NOT DETECTED.  The SARS-CoV-2 RNA is  generally detectable in upper respiratory specimens during the acute phase of infection. The lowest concentration of SARS-CoV-2 viral copies this assay can detect is 138 copies/mL. A negative result does not preclude SARS-Cov-2 infection and should not be used as the sole basis for treatment or other patient management decisions. A negative result may occur with  improper specimen collection/handling, submission of specimen other than nasopharyngeal swab, presence of viral mutation(s) within the areas targeted by this assay, and inadequate number of viral copies(<138 copies/mL). A negative result must be combined with clinical observations, patient history, and epidemiological information. The expected result is Negative.  Fact Sheet for Patients:  EntrepreneurPulse.com.au  Fact Sheet for Healthcare Providers:  IncredibleEmployment.be  This test is no t yet approved or cleared by the [place] FDA and  has been authorized for detection and/or diagnosis of SARS-CoV-2 by FDA under an Emergency Use Authorization (EUA). This EUA will remain  in effect (meaning this test can be used) for the duration of the COVID-19 declaration under Section 564(b)(1) of the Act, 21 U.S.C.section 360bbb-3(b)(1), unless the authorization is terminated  or revoked sooner.       Influenza A by PCR NEGATIVE NEGATIVE Final   Influenza B by PCR NEGATIVE NEGATIVE Final    Comment: (NOTE) The Xpert Xpress SARS-CoV-2/FLU/RSV plus assay is intended as an aid in the diagnosis of influenza from Nasopharyngeal swab specimens and should not be used as a sole basis for treatment. Nasal washings and aspirates are unacceptable for Xpert Xpress SARS-CoV-2/FLU/RSV testing.  Fact Sheet for Patients: EntrepreneurPulse.com.au  Fact Sheet for Healthcare Providers: IncredibleEmployment.be  This test is not yet approved or cleared by the [place] FDA and has been authorized for detection and/or diagnosis of SARS-CoV-2 by FDA under an Emergency Use Authorization (EUA). This EUA will remain in effect (meaning this test can be used) for the duration of the COVID-19 declaration under Section 564(b)(1) of the  Act, 21 U.S.C. section 360bbb-3(b)(1), unless the authorization is terminated or revoked.  Performed at [facility], [place] [address], [place], [place] [address]   MRSA Next Gen by PCR, Nasal     Status: None   Collection Time: 11/18/21  7:55 PM   Specimen: Nasal Mucosa; Nasal Swab  Result Value Ref Range Status   MRSA by PCR Next Gen NOT DETECTED NOT DETECTED Final    Comment: (NOTE) The GeneXpert MRSA Assay (FDA approved for NASAL specimens only), is one component of a comprehensive MRSA colonization surveillance program. It is not intended to diagnose MRSA infection nor to guide or monitor treatment for MRSA infections. Test performance is not FDA approved in patients less than 63 years old. Performed at [facility], [place] [address] [place], [place] [address]     FURTHER DISCHARGE INSTRUCTIONS:  Get Medicines reviewed and adjusted: Please take all your medications with you for your next visit with your Primary MD  Laboratory/radiological data: Please request your Primary MD to go over all hospital tests and procedure/radiological results at the follow up, please ask your Primary MD to get all Hospital records sent to his/her office.  In some cases, they will be blood work, cultures and biopsy results pending at the time of your discharge. Please request that your primary care M.D. goes through all the records of your hospital data and follows up on these results.  Also Note the following: If you experience worsening of your admission symptoms, develop shortness of breath, life threatening emergency, suicidal or homicidal thoughts you must seek medical attention immediately by calling 911 or calling  your MD immediately  if symptoms less severe.  You must read complete instructions/literature along with all the possible adverse reactions/side effects for all the Medicines you take and that have been prescribed to you. Take any new Medicines after you have completely understood and accpet all the possible adverse reactions/side effects.   Do not drive when taking Pain medications or sleeping medications (Benzodaizepines)  Do not take more than prescribed Pain, Sleep and Anxiety Medications. It is not advisable to combine anxiety,sleep and pain medications without talking with your primary care practitioner  Special Instructions: If you have smoked or chewed Tobacco  in the last 2 yrs please stop smoking, stop any regular Alcohol  and or any Recreational drug use.  Wear Seat belts while driving.  Please note: You were cared for by a hospitalist during your hospital stay. Once you are discharged, your primary care physician will handle any further medical issues. Please note that NO REFILLS for any discharge medications will be authorized once you are discharged, as it is imperative that you return to your primary care physician (or establish a relationship with a primary care physician if you do not have one) for your post hospital discharge needs so that they can reassess your need for medications and monitor your lab values.  Total Time spent coordinating discharge including counseling, education and face to face time equals 35 minutes.  SignedOren Binet 11/21/2021 9:28 AM

## 2021-11-22 ENCOUNTER — Telehealth: Payer: Self-pay | Admitting: Internal Medicine

## 2021-11-22 ENCOUNTER — Telehealth: Payer: Self-pay | Admitting: Hematology

## 2021-11-22 NOTE — Telephone Encounter (Signed)
Patient wants to follow up from last weeks visit test results and update from ER over the weekend, please assist.

## 2021-11-22 NOTE — Telephone Encounter (Signed)
Spoke with patient and he wanted to review his plan of care since hospital stay. He came into our office for appointment and labs were done. DDimer was elevated and then sent to ED for further work up. Patient now discharged and wants to come in for review of his plan of care moving forward. We discussed monitor and he wanted to return. Encouraged him to wear monitor to rule out any other issues and he was agreeable and will get that placed today. Patient wanted to come in for consultation about further management. Given his recent hospital stay and multiple questions scheduled to come in and see Dr. Okey Dupre next week. He was agreeable with this plan and had no further questions at this time.

## 2021-11-22 NOTE — Telephone Encounter (Signed)
Scheduled appt per 1/22 referral. Spoke to pt who is aware of appt date and time. Pt is aware to arrive 15 mins prior to appt time.

## 2021-11-23 DIAGNOSIS — I25118 Atherosclerotic heart disease of native coronary artery with other forms of angina pectoris: Secondary | ICD-10-CM

## 2021-11-23 DIAGNOSIS — R55 Syncope and collapse: Secondary | ICD-10-CM | POA: Diagnosis not present

## 2021-11-23 DIAGNOSIS — R0600 Dyspnea, unspecified: Secondary | ICD-10-CM | POA: Diagnosis not present

## 2021-11-24 ENCOUNTER — Telehealth: Payer: Self-pay

## 2021-11-24 NOTE — Telephone Encounter (Signed)
Prior Authorization sent through in covermymeds.com   John Mcmahon (Key: [id number]) Repatha SureClick 140MG /ML auto-injectors Awaiting approval.

## 2021-11-25 ENCOUNTER — Ambulatory Visit: Payer: Medicare Other | Admitting: Orthopaedic Surgery

## 2021-11-25 ENCOUNTER — Encounter: Payer: Self-pay | Admitting: Orthopaedic Surgery

## 2021-11-25 ENCOUNTER — Other Ambulatory Visit: Payer: Self-pay

## 2021-11-25 DIAGNOSIS — M25561 Pain in right knee: Secondary | ICD-10-CM

## 2021-11-25 DIAGNOSIS — G8929 Other chronic pain: Secondary | ICD-10-CM | POA: Diagnosis not present

## 2021-11-25 NOTE — Progress Notes (Signed)
The patient comes in today for follow-up after I placed a steroid injection in his right knee about 3 weeks ago.  He says that helped him greatly.  Unfortunate this weekend he was hospitalized due to a significant pulmonary embolus.  He is now on Eliquis.  He says his knees are feeling okay now he has just a little bit of pain in his left knee.  It turned out that he was having some type of DVT that was likely in the calf but eventually went up into the femoral area.  He had gotten acutely short of breath and again was hospitalized.  He was seen in the ICU.  On exam today both knees look great.  His pain is minimal.  With both knees showing good range of motion overall and no effusion, he does not need any type of intervention such as gel injections right now.  Since he is on a blood thinning medication he can only take Tylenol arthritis.  He can try occasional Voltaren gel.  If it does get to where his knee is bothering him again I will try steroid injections and even hyaluronic acid.  All question concerns were answered and addressed.  Follow-up is as needed.

## 2021-11-25 NOTE — Telephone Encounter (Signed)
Received call from [employer] that Repatha PA was approved through 11/24/22.

## 2021-11-26 DIAGNOSIS — I82401 Acute embolism and thrombosis of unspecified deep veins of right lower extremity: Secondary | ICD-10-CM | POA: Diagnosis not present

## 2021-11-26 DIAGNOSIS — D6859 Other primary thrombophilia: Secondary | ICD-10-CM | POA: Diagnosis not present

## 2021-11-26 DIAGNOSIS — D696 Thrombocytopenia, unspecified: Secondary | ICD-10-CM | POA: Diagnosis not present

## 2021-11-26 DIAGNOSIS — I2692 Saddle embolus of pulmonary artery without acute cor pulmonale: Secondary | ICD-10-CM | POA: Diagnosis not present

## 2021-11-28 NOTE — Progress Notes (Signed)
Follow-up Outpatient Visit Date: 12/02/2021  Primary Care Provider: Ginger Organ., MD [place] [id number]  Chief Complaint: Follow-up pulmonary embolism and nonobstructive coronary artery disease  HPI:  John Mcmahon is a 76 y.o. male with history of coronary artery disease with moderate multivessel CAD, recently diagnosed unprovoked submassive pulmonary embolism, thoracic aortic aneurysm with mildly dilated aortic arch (followed by Dr. Brigitte Pulse) hyperlipidemia, and impaired fasting glucose, who presents for follow-up of recently diagnosed pulmonary embolism.  I last saw him in September, at which time he reported less fatigue.  He reached out to our office on 11/17/2021 after experiencing an episode of sudden shortness of breath the day before.  He was found to be hypoxic by EMS, though he did not pursue transport to the ED.  He was seen the next day by Christen Bame, NP, he was feeling back to baseline at that time.  D-dimer ordered at that visit was elevated, prompting referral to the emergency department where large bilateral pulmonary emboli with evidence of right heart strain were identified consistent with submassive PE.  He was started on IV heparin and monitored in the ICU.  He did not undergo thrombolysis and was discharged home on apixaban.  He is scheduled for hematology consultation later today.  Today, John Mcmahon reports that he is feeling quite well.  Other than some mild fatigue, he feels back to normal.  He has not had any chest pain, shortness of breath, palpitations, lightheadedness, or edema.  He reports that he began having pain in the right calf in August and saw both his PCP and a couple of orthopedics providers.  This was initially diagnosed as bursitis followed by tendinitis.  He ultimately underwent an injection in the right knee with resolution of the pain.  A few weeks later, he was diagnosed with the aforementioned pulmonary embolism.  He remains compliant  with his medications including apixaban 5 mg twice daily.  He has not had any bleeding.  Some soreness in right calf since August.  Saw ortho and said bursitis.  Then muscle issues did PT.  Feels back to normal.  Wife says alittle more fatigued than normal.  --------------------------------------------------------------------------------------------------  Cardiovascular History & Procedures: Cardiovascular Problems: Submassive pulmonary embolism (10/2021) Mild to moderate coronary artery disease Thoracic aortic aneurysm (mild dilation of aortic arch)   Risk Factors: Known coronary artery disease, hyperlipidemia, male gender, age greater than 73   Cath/PCI: LHC (08/07/17): LMCA normal. LAD with 50% midvessel stenosis. Codominant LCx with 40% lPDA lesion. 50% proximal and 25% mid RCA lesions. LVEF 55-65%. LVEDP 10 mmHg.   CV Surgery: None   EP Procedures and Devices: None   Non-Invasive Evaluation(s): TTE (11/19/2021): Normal LV size and wall thickness.  LVEF 60-65% with indeterminate diastolic function.  Normal wall motion.  Normal RV size and function.  Unable to assess PA pressure.  Normal biatrial size.  No significant valvular abnormality. Lower extremity venous duplex (11/18/2021): Findings consistent with age-indeterminate DVT involving the right femoral and peroneal veins. Exercise MPI (06/03/2021): Low risk study without ischemia or scar.  Good exercise capacity, achieving 10.9 METS and 95% MPHR. Coronary calcium score (06/29/17): Calcium score of 952 with extensive calcifications in the LAD him a as well as less prominent calcification involving the LMCA, LCx, and RCA. Slight aneurysmal dilation of ascending aortic, measuring 4.0 cm. 18 mm left lower lobe nodule.  Recent CV Pertinent Labs: Lab Results  Component Value Date   CHOL 117 05/27/2021  HDL 45 05/27/2021   LDLCALC 53 05/27/2021   TRIG 102 05/27/2021   CHOLHDL 2.6 05/27/2021   INR 1.06 12/15/2018   BNP 56.9  11/18/2021   K 4.0 11/19/2021   MG 2.1 11/18/2021   BUN 14 11/19/2021   BUN 18 05/27/2021   CREATININE 0.94 11/19/2021    Past medical and surgical history were reviewed and updated in EPIC.  Current Meds  Medication Sig   amoxicillin (AMOXIL) 500 MG capsule Take 2,000 mg by mouth See admin instructions. Take 2,000 mg by mouth one hour prior to dental treatment or as otherwise instructed (Patient not taking: Reported on 12/02/2021)   bisacodyl (DULCOLAX) 5 MG EC tablet Take 5 mg by mouth daily as needed for mild constipation or moderate constipation.   nitroGLYCERIN (NITROSTAT) 0.4 MG SL tablet Place 1 tablet (0.4 mg total) under the tongue every 5 (five) minutes as needed for chest pain.   REPATHA SURECLICK 161 MG/ML SOAJ INJECT ONE PEN INTO THE SKIN EVERY 14 DAYS   [DISCONTINUED] apixaban (ELIQUIS) 5 MG TABS tablet Take 5 mg by mouth 2 (two) times daily.    Allergies: Other and Atorvastatin  Social History   Tobacco Use   Smoking status: Former    Packs/day: 1.00    Years: 2.00    Pack years: 2.00    Types: Cigarettes    Quit date: 08/03/1969    Years since quitting: 52.3   Smokeless tobacco: Never  Vaping Use   Vaping Use: Never used  Substance Use Topics   Alcohol use: Yes    Alcohol/week: 4.0 standard drinks    Types: 4 Standard drinks or equivalent per week    Comment: 1 drink every other day   Drug use: No    Family History  Problem Relation Age of Onset   Multiple myeloma Mother    Cancer - Other Mother 57       BREAST   Other Mother        sepsis   Dementia Father    Cancer Father        neck    Cancer - Other Maternal Grandmother    Diabetes Maternal Grandmother    Heart attack Maternal Grandfather 56   Dementia Paternal Grandfather    Healthy Son    Healthy Son    Healthy Daughter    Heart attack Cousin    Cancer Paternal Grandmother     Review of Systems: A 12-system review of systems was performed and was negative except as noted in the  HPI.  --------------------------------------------------------------------------------------------------  Physical Exam: BP (!) 142/86 (BP Location: Left Arm, Patient Position: Sitting, Cuff Size: Normal)    Pulse 71    Ht 5' 10"  (1.778 m)    Wt 171 lb (77.6 kg)    SpO2 98%    BMI 24.54 kg/m   General:  NAD.  Accompanied by his wife. Neck: No JVD or HJR. Lungs: Clear to auscultation bilaterally without wheezes or crackles. Heart: Regular rate and rhythm without murmurs, rubs, or gallops. Abdomen: Soft, nontender, nondistended. Extremities: No lower extremity edema.  EKG: Normal sinus rhythm without abnormality.  Lab Results  Component Value Date   WBC 5.9 11/21/2021   HGB 14.4 11/21/2021   HCT 43.4 11/21/2021   MCV 90.4 11/21/2021   PLT 147 (L) 11/21/2021    Lab Results  Component Value Date   NA 138 11/19/2021   K 4.0 11/19/2021   CL 104 11/19/2021   CO2 26 11/19/2021  BUN 14 11/19/2021   CREATININE 0.94 11/19/2021   GLUCOSE 189 (H) 11/19/2021   ALT 16 11/19/2021    Lab Results  Component Value Date   CHOL 117 05/27/2021   HDL 45 05/27/2021   LDLCALC 53 05/27/2021   TRIG 102 05/27/2021   CHOLHDL 2.6 05/27/2021    --------------------------------------------------------------------------------------------------  ASSESSMENT AND PLAN: Submassive pulmonary embolism and deep venous thrombosis: Fortunately, John Mcmahon has recovered well from his submassive PE last month.  Though there was concern for right heart strain on his initial chest CTA, echocardiogram demonstrated normal biventricular size and function.  PA pressure could not be estimated.  High-sensitivity troponin I was minimally elevated (43 -> 36).  BNP was normal.  Thrombolysis/thrombectomy was not performed during John Mcmahon is hospitalization.  He remains on apixaban (he has completed 10 mg twice daily and is now on 5 mg twice daily).  I recommend indefinite continuation of anticoagulation pending further  recommendations by hematology.  Coronary artery disease: No angina reported in the setting of moderate CAD observed on prior catheterization.  Continue evolocumab to prevent progression of disease.  It is reasonable to defer aspirin in the setting of long-term anticoagulation with apixaban.  Hyperlipidemia: Lipids well controlled on last check.  Continue evolocumab.  Follow-up: Return to clinic in 3 months.  Nelva Bush, MD 12/04/2021 10:39 AM

## 2021-12-02 ENCOUNTER — Other Ambulatory Visit: Payer: Self-pay

## 2021-12-02 ENCOUNTER — Ambulatory Visit: Payer: Medicare Other | Admitting: Internal Medicine

## 2021-12-02 ENCOUNTER — Encounter: Payer: Self-pay | Admitting: Internal Medicine

## 2021-12-02 ENCOUNTER — Inpatient Hospital Stay: Payer: Medicare Other | Attending: Hematology | Admitting: Hematology

## 2021-12-02 VITALS — BP 142/86 | HR 71 | Ht 70.0 in | Wt 171.0 lb

## 2021-12-02 VITALS — BP 138/85 | HR 78 | Temp 97.7°F | Resp 18 | Wt 172.8 lb

## 2021-12-02 DIAGNOSIS — Z7289 Other problems related to lifestyle: Secondary | ICD-10-CM | POA: Insufficient documentation

## 2021-12-02 DIAGNOSIS — Z7901 Long term (current) use of anticoagulants: Secondary | ICD-10-CM | POA: Insufficient documentation

## 2021-12-02 DIAGNOSIS — I2609 Other pulmonary embolism with acute cor pulmonale: Secondary | ICD-10-CM

## 2021-12-02 DIAGNOSIS — E785 Hyperlipidemia, unspecified: Secondary | ICD-10-CM

## 2021-12-02 DIAGNOSIS — Z87891 Personal history of nicotine dependence: Secondary | ICD-10-CM | POA: Diagnosis not present

## 2021-12-02 DIAGNOSIS — I10 Essential (primary) hypertension: Secondary | ICD-10-CM | POA: Insufficient documentation

## 2021-12-02 DIAGNOSIS — R7989 Other specified abnormal findings of blood chemistry: Secondary | ICD-10-CM | POA: Diagnosis not present

## 2021-12-02 DIAGNOSIS — I2699 Other pulmonary embolism without acute cor pulmonale: Secondary | ICD-10-CM | POA: Diagnosis not present

## 2021-12-02 DIAGNOSIS — I700 Atherosclerosis of aorta: Secondary | ICD-10-CM | POA: Diagnosis not present

## 2021-12-02 DIAGNOSIS — Z8249 Family history of ischemic heart disease and other diseases of the circulatory system: Secondary | ICD-10-CM | POA: Insufficient documentation

## 2021-12-02 DIAGNOSIS — I82411 Acute embolism and thrombosis of right femoral vein: Secondary | ICD-10-CM | POA: Diagnosis not present

## 2021-12-02 DIAGNOSIS — Z808 Family history of malignant neoplasm of other organs or systems: Secondary | ICD-10-CM | POA: Insufficient documentation

## 2021-12-02 DIAGNOSIS — Z833 Family history of diabetes mellitus: Secondary | ICD-10-CM | POA: Insufficient documentation

## 2021-12-02 DIAGNOSIS — Z79899 Other long term (current) drug therapy: Secondary | ICD-10-CM | POA: Diagnosis not present

## 2021-12-02 DIAGNOSIS — I082 Rheumatic disorders of both aortic and tricuspid valves: Secondary | ICD-10-CM | POA: Insufficient documentation

## 2021-12-02 DIAGNOSIS — I2510 Atherosclerotic heart disease of native coronary artery without angina pectoris: Secondary | ICD-10-CM | POA: Diagnosis not present

## 2021-12-02 DIAGNOSIS — Z818 Family history of other mental and behavioral disorders: Secondary | ICD-10-CM | POA: Insufficient documentation

## 2021-12-02 DIAGNOSIS — Z803 Family history of malignant neoplasm of breast: Secondary | ICD-10-CM | POA: Insufficient documentation

## 2021-12-02 MED ORDER — APIXABAN 5 MG PO TABS: 5.0000 mg | ORAL_TABLET | Freq: Two times a day (BID) | ORAL | 5 refills | Status: AC

## 2021-12-02 NOTE — Patient Instructions (Signed)
Medication Instructions:  ? ?Your physician recommends that you continue on your current medications as directed. Please refer to the Current Medication list given to you today. ? ?*If you need a refill on your cardiac medications before your next appointment, please call your pharmacy* ? ? ?Lab Work: ? ?None ordered ? ?Testing/Procedures: ? ?None ordered ? ? ?Follow-Up: ?At [facility], you and your health needs are our priority.  As part of our continuing mission to provide you with exceptional heart care, we have created designated Provider Care Teams.  These Care Teams include your primary Cardiologist (physician) and Advanced Practice Providers (APPs -  Physician Assistants and Nurse Practitioners) who all work together to provide you with the care you need, when you need it. ? ?We recommend signing up for the patient portal called "MyChart".  Sign up information is provided on this After Visit Summary.  MyChart is used to connect with patients for Virtual Visits (Telemedicine).  Patients are able to view lab/test results, encounter notes, upcoming appointments, etc.  Non-urgent messages can be sent to your provider as well.   ?To learn more about what you can do with MyChart, go to [email or web address].   ? ?Your next appointment:   ?3 month(s) ? ?The format for your next appointment:   ?In Person ? ?Provider:   ?Christopher End, MD ?

## 2021-12-02 NOTE — Progress Notes (Signed)
John Mcmahon Kitchen   HEMATOLOGY/ONCOLOGY CONSULTATION NOTE  Date of Service: 12/02/2021  Patient Care Team: Ginger Organ., MD as PCP - General (Internal Medicine) End, Harrell Gave, MD as PCP - Cardiology (Cardiology)  CHIEF COMPLAINTS/PURPOSE OF CONSULTATION:    HISTORY OF PRESENTING ILLNESS:  John Mcmahon is a wonderful 76 y.o. male who has been referred to [place] by Dr Merryl Hacker for evaluation and management of   MEDICAL HISTORY:  Past Medical History:  Diagnosis Date   Acute meniscal tear, lateral, left, initial encounter    Aortic arch aneurysm    a. 12/2019 CTA: 3.1cm fusiform Ao arch aneurysm - stable.   CAD (coronary artery disease)    a. 07/2017 Cath: LM nl, LAD 40p, 32m D2 25, RI 44mLCX 40d, RCA 50p, 2564mF 55-65%-->Med rx; b. 05/2021 MV: EF 55%, no ischemia/infarct-->low risk.   ED (erectile dysfunction)    Elevated blood pressure reading in office with white coat syndrome, without diagnosis of hypertension    Hyperlipidemia    IFG (impaired fasting glucose)    Insomnia    Lumbago     SURGICAL HISTORY: Past Surgical History:  Procedure Laterality Date   CARDIAC CATHETERIZATION     CATARACT EXTRACTION     COLONOSCOPY     LEFT HEART CATH AND CORONARY ANGIOGRAPHY N/A 08/07/2017   Procedure: LEFT HEART CATH AND CORONARY ANGIOGRAPHY;  Surgeon: EndNelva BushD;  Location: [facility];  Service: Cardiovascular;  Laterality: N/A;    SOCIAL HISTORY: Social History   Socioeconomic History   Marital status: Married    Spouse name: Not on file   Number of children: 3   Years of education: Not on file   Highest education level: Professional school degree (e.g., MD, DDS, DVM, JD)  Occupational History   Not on file  Tobacco Use   Smoking status: Former    Packs/day: 1.00    Years: 2.00    Pack years: 2.00    Types: Cigarettes    Quit date: 08/03/1969    Years since quitting: 52.3   Smokeless tobacco: Never  Vaping Use   Vaping Use: Never used  Substance and  Sexual Activity   Alcohol use: Yes    Alcohol/week: 4.0 standard drinks    Types: 4 Standard drinks or equivalent per week    Comment: 1 drink every other day   Drug use: No   Sexual activity: Not on file  Other Topics Concern   Not on file  Social History Narrative   Lives at home with spouse    Right handed   Caffeine: 1 cup tea daily   Social Determinants of Health   Financial Resource Strain: Not on file  Food Insecurity: Not on file  Transportation Needs: Not on file  Physical Activity: Not on file  Stress: Not on file  Social Connections: Not on file  Intimate Partner Violence: Not on file    FAMILY HISTORY: Family History  Problem Relation Age of Onset   Multiple myeloma Mother    Cancer - Other Mother 40 71    BREAST   Other Mother        sepsis   Dementia Father    Cancer Father        neck    Cancer - Other Maternal Grandmother    Diabetes Maternal Grandmother    Heart attack Maternal Grandfather 65 87Dementia Paternal Grandfather    Healthy Son    Healthy Son  Healthy Daughter    Heart attack Cousin    Cancer Paternal Grandmother     ALLERGIES:  is allergic to other and atorvastatin.  MEDICATIONS:  Current Outpatient Medications  Medication Sig Dispense Refill   apixaban (ELIQUIS) 5 MG TABS tablet Take 5 mg by mouth 2 (two) times daily.     bisacodyl (DULCOLAX) 5 MG EC tablet Take 5 mg by mouth daily as needed for mild constipation or moderate constipation.     nitroGLYCERIN (NITROSTAT) 0.4 MG SL tablet Place 1 tablet (0.4 mg total) under the tongue every 5 (five) minutes as needed for chest pain. 25 tablet 3   REPATHA SURECLICK 794 MG/ML SOAJ INJECT ONE PEN INTO THE SKIN EVERY 14 DAYS 6 mL 2   amoxicillin (AMOXIL) 500 MG capsule Take 2,000 mg by mouth See admin instructions. Take 2,000 mg by mouth one hour prior to dental treatment or as otherwise instructed (Patient not taking: Reported on 12/02/2021)     No current facility-administered  medications for this visit.    REVIEW OF SYSTEMS:    10 Point review of Systems was done is negative except as noted above.  PHYSICAL EXAMINATION: ECOG PERFORMANCE STATUS:   . Vitals:   12/02/21 1320  BP: 138/85  Pulse: 78  Resp: 18  Temp: 97.7 F (36.5 C)  SpO2: 98%   Filed Weights   12/02/21 1320  Weight: 172 lb 12.8 oz (78.4 kg)   .Body mass index is 24.79 kg/m.  GENERAL:alert, in no acute distress and comfortable SKIN: no acute rashes, no significant lesions EYES: conjunctiva are pink and non-injected, sclera anicteric OROPHARYNX: MMM, no exudates, no oropharyngeal erythema or ulceration NECK: supple, no JVD LYMPH:  no palpable lymphadenopathy in the cervical, axillary or inguinal regions LUNGS: clear to auscultation b/l with normal respiratory effort HEART: regular rate & rhythm ABDOMEN:  normoactive bowel sounds , non tender, not distended. Extremity: no pedal edema PSYCH: alert & oriented x 3 with fluent speech NEURO: no focal motor/sensory deficits  LABORATORY DATA:  I have reviewed the data as listed  . CBC Latest Ref Rng & Units 11/21/2021 11/20/2021 11/19/2021  WBC 4.0 - 10.5 K/uL 5.9 6.0 5.7  Hemoglobin 13.0 - 17.0 g/dL 14.4 15.2 14.2  Hematocrit 39.0 - 52.0 % 43.4 46.0 42.1  Platelets 150 - 400 K/uL 147(L) 134(L) 115(L)    . CMP Latest Ref Rng & Units 11/19/2021 11/18/2021 11/18/2021  Glucose 70 - 99 mg/dL 189(H) 171(H) 142(H)  BUN 8 - 23 mg/dL _0 Creatinine 0.61 - 1.24 mg/dL 0.94 1.11 0.84  Sodium 135 - 145 mmol/L 138 142 138  Potassium 3.5 - 5.1 mmol/L 4.0 4.3 4.3  Chloride 98 - 111 mmol/L 104 100 104  CO2 22 - 32 mmol/L _1 Calcium 8.9 - 10.3 mg/dL 8.4(L) 9.8 9.3  Total Protein 6.5 - 8.1 g/dL 6.0(L) - 7.2  Total Bilirubin 0.3 - 1.2 mg/dL 1.5(H) - 1.6(H)  Alkaline Phos 38 - 126 U/L 64 - 71  AST 15 - 41 U/L 14(L) - 20  ALT 0 - 44 U/L 16 - 21     RADIOGRAPHIC STUDIES: I have personally reviewed the radiological images as listed  and agreed with the findings in the report. CT Angio Chest PE W/Cm &/Or Wo Cm  Result Date: 11/18/2021 CLINICAL DATA:  Short of breath, positive D-dimer EXAM: CT ANGIOGRAPHY CHEST WITH CONTRAST TECHNIQUE: Multidetector CT imaging of the chest was performed using the standard protocol during bolus  administration of intravenous contrast. Multiplanar CT image reconstructions and MIPs were obtained to evaluate the vascular anatomy. RADIATION DOSE REDUCTION: This exam was performed according to the departmental dose-optimization program which includes automated exposure control, adjustment of the mA and/or kV according to patient size and/or use of iterative reconstruction technique. CONTRAST:  127m OMNIPAQUE IOHEXOL 350 MG/ML SOLN COMPARISON:  01/24/2020 FINDINGS: Cardiovascular: This is a technically adequate evaluation of the pulmonary vasculature. There are large bilateral pulmonary emboli involving the main pulmonary arteries and segmental branches. Moderate clot burden. There is straightening of the interventricular septum, with RV/LV ratio measuring 1.3 consistent with right heart strain. No pericardial effusion. No evidence of thoracic aortic aneurysm or dissection. Mild atherosclerosis of the aorta and coronary vasculature. Mediastinum/Nodes: No enlarged mediastinal, hilar, or axillary lymph nodes. Thyroid gland, trachea, and esophagus demonstrate no significant findings. Lungs/Pleura: No acute airspace disease, effusion, or pneumothorax. Central airways are patent. Upper Abdomen: No acute abnormality. Musculoskeletal: No acute or destructive bony lesions. Reconstructed images demonstrate no additional findings. Review of the MIP images confirms the above findings. IMPRESSION: 1. Large bilateral pulmonary emboli, with CT evidence of right heart strain (RV/LV Ratio = 1.3) consistent with at least submassive (intermediate risk) PE. The presence of right heart strain has been associated with an increased risk  of morbidity and mortality. Please refer to the "PE Focused" order set in EPIC. 2.  Aortic Atherosclerosis (ICD10-I70.0). Critical Value/emergent results were called by telephone at the time of interpretation on 11/18/2021 at 5:48 pm to provider HShabbona, who verbally acknowledged these results. Electronically Signed   By: MRanda NgoM.D.   On: 11/18/2021 17:51   ECHOCARDIOGRAM COMPLETE  Result Date: 11/19/2021    ECHOCARDIOGRAM REPORT   Patient Name:   RDEMONTA WOMBLESDate of Exam: 11/19/2021 Medical Rec #:  [id number]      Height:       70.0 in Accession #:    [id number]     Weight:       168.9 lb Date of Birth:  511-17-1947      BSA:          1.943 m Patient Age:    89years        BP:           129/81 mmHg Patient Gender: M               HR:           72 bpm. Exam Location:  Inpatient Procedure: 2D Echo, Cardiac Doppler and Color Doppler Indications:    Pulmonary Embolus  History:        Patient has no prior history of Echocardiogram examinations.                 CAD; Risk Factors:Hypertension.  Sonographer:    BGlo HerringReferring Phys: [id number] 1. Left ventricular ejection fraction, by estimation, is 60 to 65%. The left ventricle has normal function. The left ventricle has no regional wall motion abnormalities. Left ventricular diastolic parameters are indeterminate.  2. Right ventricular systolic function is normal. The right ventricular size is normal. Tricuspid regurgitation signal is inadequate for assessing PA pressure.  3. The mitral valve was not well visualized. No evidence of mitral valve regurgitation.  4. The aortic valve was not well visualized. Aortic valve regurgitation is trivial.  5. The inferior vena cava is normal in size with greater than 50% respiratory variability, suggesting right  atrial pressure of 3 mmHg. Comparison(s): No prior Echocardiogram. FINDINGS  Left Ventricle: Left ventricular ejection fraction, by estimation, is 60 to 65%. The left  ventricle has normal function. The left ventricle has no regional wall motion abnormalities. The left ventricular internal cavity size was normal in size. There is  no left ventricular hypertrophy. Left ventricular diastolic parameters are indeterminate. Right Ventricle: The right ventricular size is normal. No increase in right ventricular wall thickness. Right ventricular systolic function is normal. Tricuspid regurgitation signal is inadequate for assessing PA pressure. Left Atrium: Left atrial size was normal in size. Right Atrium: Right atrial size was normal in size. Pericardium: There is no evidence of pericardial effusion. Mitral Valve: The mitral valve was not well visualized. No evidence of mitral valve regurgitation. Tricuspid Valve: The tricuspid valve is not well visualized. Tricuspid valve regurgitation is not demonstrated. Aortic Valve: The aortic valve was not well visualized. Aortic valve regurgitation is trivial. Aortic valve mean gradient measures 3.5 mmHg. Aortic valve peak gradient measures 5.9 mmHg. Aortic valve area, by VTI measures 2.44 cm. Pulmonic Valve: The pulmonic valve was not well visualized. Aorta: The aortic root and ascending aorta are structurally normal, with no evidence of dilitation. Venous: The inferior vena cava is normal in size with greater than 50% respiratory variability, suggesting right atrial pressure of 3 mmHg. IAS/Shunts: The interatrial septum was not well visualized.  LEFT VENTRICLE PLAX 2D LVOT diam:     2.00 cm   Diastology LV SV:         58        LV e' medial:  4.46 cm/s LV SV Index:   30        LV e' lateral: 6.96 cm/s LVOT Area:     3.14 cm  RIGHT VENTRICLE             IVC RV Basal diam:  3.60 cm     IVC diam: 1.90 cm RV Mid diam:    2.60 cm RV S prime:     14.60 cm/s LEFT ATRIUM             Index        RIGHT ATRIUM           Index LA Vol (A2C):   48.4 ml 24.92 ml/m  RA Area:     15.30 cm LA Vol (A4C):   44.8 ml 23.06 ml/m  RA Volume:   35.30 ml  18.17  ml/m LA Biplane Vol: 47.2 ml 24.30 ml/m  AORTIC VALVE                    PULMONIC VALVE AV Area (Vmax):    2.52 cm     PV Vmax:       0.73 m/s AV Area (Vmean):   2.54 cm     PV Peak grad:  2.1 mmHg AV Area (VTI):     2.44 cm AV Vmax:           121.00 cm/s AV Vmean:          84.400 cm/s AV VTI:            0.237 m AV Peak Grad:      5.9 mmHg AV Mean Grad:      3.5 mmHg LVOT Vmax:         96.90 cm/s LVOT Vmean:        68.300 cm/s LVOT VTI:          0.184  m LVOT/AV VTI ratio: 0.78  AORTA Ao Root diam: 3.90 cm Ao Asc diam:  3.60 cm  SHUNTS Systemic VTI:  0.18 m Systemic Diam: 2.00 cm Phineas Inches Electronically signed by Phineas Inches Signature Date/Time: 11/19/2021/10:30:45 AM    Final    VAS [place] LOWER EXTREMITY VENOUS (DVT) (ONLY [facility])  Result Date: 11/19/2021  Lower Venous DVT Study Patient Name:  LUKAS PELCHER  Date of Exam:   11/18/2021 Medical Rec #: [id number]      Accession #:    [id number] Date of Birth: 1946/05/01      Patient Gender: M Patient Age:   34 years Exam Location:  [facility] Procedure:      VAS [place] LOWER EXTREMITY VENOUS (DVT) Referring Phys: Marda Stalker --------------------------------------------------------------------------------  Indications: Pulmonary embolism.  Risk Factors: Confirmed PE. Anticoagulation: Heparin. Comparison Study: No prior studies. Performing Technologist: Oliver Hum RVT  Examination Guidelines: A complete evaluation includes B-mode imaging, spectral Doppler, color Doppler, and power Doppler as needed of all accessible portions of each vessel. Bilateral testing is considered an integral part of a complete examination. Limited examinations for reoccurring indications may be performed as noted. The reflux portion of the exam is performed with the patient in reverse Trendelenburg.  +---------+---------------+---------+-----------+----------+-----------------+  RIGHT     Compressibility Phasicity Spontaneity Properties Thrombus Aging      +---------+---------------+---------+-----------+----------+-----------------+  CFV       Full            Yes       Yes                                       +---------+---------------+---------+-----------+----------+-----------------+  SFJ       Full                                                                +---------+---------------+---------+-----------+----------+-----------------+  FV Prox   Partial         Yes       Yes                    Age Indeterminate  +---------+---------------+---------+-----------+----------+-----------------+  FV Mid    Partial         Yes       Yes                    Age Indeterminate  +---------+---------------+---------+-----------+----------+-----------------+  FV Distal None            No        No                     Age Indeterminate  +---------+---------------+---------+-----------+----------+-----------------+  PFV       Full                                                                +---------+---------------+---------+-----------+----------+-----------------+  POP       Full  Yes       Yes                                       +---------+---------------+---------+-----------+----------+-----------------+  PTV       Full                                                                +---------+---------------+---------+-----------+----------+-----------------+  PERO      Partial                                          Age Indeterminate  +---------+---------------+---------+-----------+----------+-----------------+   +---------+---------------+---------+-----------+----------+--------------+  LEFT      Compressibility Phasicity Spontaneity Properties Thrombus Aging  +---------+---------------+---------+-----------+----------+--------------+  CFV       Full            Yes       Yes                                    +---------+---------------+---------+-----------+----------+--------------+  SFJ       Full                                                              +---------+---------------+---------+-----------+----------+--------------+  FV Prox   Full                                                             +---------+---------------+---------+-----------+----------+--------------+  FV Mid    Full                                                             +---------+---------------+---------+-----------+----------+--------------+  FV Distal Full                                                             +---------+---------------+---------+-----------+----------+--------------+  PFV       Full                                                             +---------+---------------+---------+-----------+----------+--------------+  POP  Full            Yes       Yes                                    +---------+---------------+---------+-----------+----------+--------------+  PTV       Full                                                             +---------+---------------+---------+-----------+----------+--------------+  PERO      Full                                                             +---------+---------------+---------+-----------+----------+--------------+     Summary: RIGHT: - Findings consistent with age indeterminate deep vein thrombosis involving the right femoral vein, and right peroneal veins. - No cystic structure found in the popliteal fossa.  LEFT: - There is no evidence of deep vein thrombosis in the lower extremity.  - No cystic structure found in the popliteal fossa.  *See table(s) above for measurements and observations. Electronically signed by Orlie Pollen on 11/19/2021 at 8:42:50 AM.    Final     ASSESSMENT & PLAN:    All of the patients questions were answered with apparent satisfaction. The patient knows to call the clinic with any problems, questions or concerns.  I spent  counseling the patient face to face. The total time spent in the appointment was  and more than 50% was on counseling and direct patient  cares.    Sullivan Lone MD [place] AAHIVMS [facility] Hematology/Oncology Physician [facility]  (Office):       [phone] (Work cell):  [phone] (Fax):           [phone]  12/02/2021 2:09 PM

## 2021-12-02 NOTE — Progress Notes (Addendum)
HEMATOLOGY/ONCOLOGY CONSULTATION NOTE  Date of Service: 12/02/2021  Patient Care Team: Ginger Organ., MD as PCP - General (Internal Medicine) End, Harrell Gave, MD as PCP - Cardiology (Cardiology)  CHIEF COMPLAINTS/PURPOSE OF CONSULTATION:  Submassive pulmonary embolism and DVT  HISTORY OF PRESENTING ILLNESS:   John Mcmahon is a wonderful 76 y.o. male who has been referred to [place] by Dr .Brigitte Pulse, Emily Filbert., MD  for evaluation and management of submassive pulmonary embolism and DVT.  Patient has a history of hypertension, dyslipidemia, thoracic aortic aneurysm, nonobstructive CAD who presented to the hospital on 11/18/2021 with a presyncopal episode and new onset shortness of breath for a couple of days prior to hospitalization.  Patient was apparently seen in the cardiology office first and was noted to have significant elevated D-dimers after which she was sent to the emergency room.  Patient had a CTA of the chest on 11/18/2021 which showed large bilateral pulmonary emboli with CT evidence of right heart strain consistent with at least submassive PE.  DVT bilateral lower extremity venous on 11/18/2021 showed age-indeterminate DVT involving the right femoral vein and right peroneal veins.  No DVT on the left side.  Echocardiogram 11/19/2021 showed normal ejection fraction of 60 to 65% with no regional wall motion abnormalities. Normal right ventricular function.  Patient was observed in the ICU on IV heparin and then transition to Eliquis.  Patient has completed his Eliquis load at 10 mg p.o. twice daily and is now down to 5 mg p.o. twice daily. He notes his shortness of breath has significantly improved.  He has not had any further presyncopal symptoms since discharge. No current chest pain dizziness or significant leg pain or swelling.  Patient denies any recent long distance travel. No recent surgeries.  Does note that he had some right calf pain in August 2022 which was  evaluated by PCP and orthopedics and was diagnosed with a bursitis/tendinitis and underwent injection in the right knee with resolution of symptoms. No testosterone use No previous history of VTE prior to this No family history of VTE No bleeding issues from his current anticoagulation. Was on aspirin when he developed his PE and DVT.    MEDICAL HISTORY:  Past Medical History:  Diagnosis Date   Acute meniscal tear, lateral, left, initial encounter    Aortic arch aneurysm    a. 12/2019 CTA: 3.1cm fusiform Ao arch aneurysm - stable.   CAD (coronary artery disease)    a. 07/2017 Cath: LM nl, LAD 40p, 5m D2 25, RI 489mLCX 40d, RCA 50p, 2522mF 55-65%-->Med rx; b. 05/2021 MV: EF 55%, no ischemia/infarct-->low risk.   ED (erectile dysfunction)    Elevated blood pressure reading in office with white coat syndrome, without diagnosis of hypertension    Hyperlipidemia    IFG (impaired fasting glucose)    Insomnia    Lumbago     SURGICAL HISTORY: Past Surgical History:  Procedure Laterality Date   CARDIAC CATHETERIZATION     CATARACT EXTRACTION     COLONOSCOPY     LEFT HEART CATH AND CORONARY ANGIOGRAPHY N/A 08/07/2017   Procedure: LEFT HEART CATH AND CORONARY ANGIOGRAPHY;  Surgeon: EndNelva BushD;  Location: [facility];  Service: Cardiovascular;  Laterality: N/A;    SOCIAL HISTORY: Social History   Socioeconomic History   Marital status: Married    Spouse name: Not on file   Number of children: 3   Years of education: Not on file  Highest education level: Professional school degree (e.g., MD, DDS, DVM, JD)  Occupational History   Not on file  Tobacco Use   Smoking status: Former    Packs/day: 1.00    Years: 2.00    Pack years: 2.00    Types: Cigarettes    Quit date: 08/03/1969    Years since quitting: 52.3   Smokeless tobacco: Never  Vaping Use   Vaping Use: Never used  Substance and Sexual Activity   Alcohol use: Yes    Alcohol/week: 4.0 standard  drinks    Types: 4 Standard drinks or equivalent per week    Comment: 1 drink every other day   Drug use: No   Sexual activity: Not on file  Other Topics Concern   Not on file  Social History Narrative   Lives at home with spouse    Right handed   Caffeine: 1 cup tea daily   Social Determinants of Health   Financial Resource Strain: Not on file  Food Insecurity: Not on file  Transportation Needs: Not on file  Physical Activity: Not on file  Stress: Not on file  Social Connections: Not on file  Intimate Partner Violence: Not on file    FAMILY HISTORY: Family History  Problem Relation Age of Onset   Multiple myeloma Mother    Cancer - Other Mother 72       BREAST   Other Mother        sepsis   Dementia Father    Cancer Father        neck    Cancer - Other Maternal Grandmother    Diabetes Maternal Grandmother    Heart attack Maternal Grandfather 14   Dementia Paternal Grandfather    Healthy Son    Healthy Son    Healthy Daughter    Heart attack Cousin    Cancer Paternal Grandmother   Mother - Myeloma  ALLERGIES:  is allergic to other and atorvastatin.  MEDICATIONS:  Current Outpatient Medications  Medication Sig Dispense Refill   apixaban (ELIQUIS) 5 MG TABS tablet Take 5 mg by mouth 2 (two) times daily.     bisacodyl (DULCOLAX) 5 MG EC tablet Take 5 mg by mouth daily as needed for mild constipation or moderate constipation.     nitroGLYCERIN (NITROSTAT) 0.4 MG SL tablet Place 1 tablet (0.4 mg total) under the tongue every 5 (five) minutes as needed for chest pain. 25 tablet 3   REPATHA SURECLICK 998 MG/ML SOAJ INJECT ONE PEN INTO THE SKIN EVERY 14 DAYS 6 mL 2   amoxicillin (AMOXIL) 500 MG capsule Take 2,000 mg by mouth See admin instructions. Take 2,000 mg by mouth one hour prior to dental treatment or as otherwise instructed (Patient not taking: Reported on 12/02/2021)     No current facility-administered medications for this visit.    REVIEW OF SYSTEMS:     10 Point review of Systems was done is negative except as noted above.  PHYSICAL EXAMINATION: ECOG PERFORMANCE STATUS:   . Vitals:   12/02/21 1320  BP: 138/85  Pulse: 78  Resp: 18  Temp: 97.7 F (36.5 C)  SpO2: 98%   Filed Weights   12/02/21 1320  Weight: 172 lb 12.8 oz (78.4 kg)   .Body mass index is 24.79 kg/m.  GENERAL:alert, in no acute distress and comfortable SKIN: no acute rashes, no significant lesions EYES: conjunctiva are pink and non-injected, sclera anicteric OROPHARYNX: MMM, no exudates, no oropharyngeal erythema or ulceration NECK: supple, no  JVD LYMPH:  no palpable lymphadenopathy in the cervical, axillary or inguinal regions LUNGS: clear to auscultation b/l with normal respiratory effort HEART: regular rate & rhythm ABDOMEN:  normoactive bowel sounds , non tender, not distended. Extremity: no pedal edema PSYCH: alert & oriented x 3 with fluent speech NEURO: no focal motor/sensory deficits  LABORATORY DATA:  I have reviewed the data as listed  . CBC Latest Ref Rng & Units 11/21/2021 11/20/2021 11/19/2021  WBC 4.0 - 10.5 K/uL 5.9 6.0 5.7  Hemoglobin 13.0 - 17.0 g/dL 14.4 15.2 14.2  Hematocrit 39.0 - 52.0 % 43.4 46.0 42.1  Platelets 150 - 400 K/uL 147(L) 134(L) 115(L)    . CMP Latest Ref Rng & Units 11/19/2021 11/18/2021 11/18/2021  Glucose 70 - 99 mg/dL 189(H) 171(H) 142(H)  BUN 8 - 23 mg/dL 14 21 21   Creatinine 0.61 - 1.24 mg/dL 0.94 1.11 0.84  Sodium 135 - 145 mmol/L 138 142 138  Potassium 3.5 - 5.1 mmol/L 4.0 4.3 4.3  Chloride 98 - 111 mmol/L 104 100 104  CO2 22 - 32 mmol/L 26 26 26   Calcium 8.9 - 10.3 mg/dL 8.4(L) 9.8 9.3  Total Protein 6.5 - 8.1 g/dL 6.0(L) - 7.2  Total Bilirubin 0.3 - 1.2 mg/dL 1.5(H) - 1.6(H)  Alkaline Phos 38 - 126 U/L 64 - 71  AST 15 - 41 U/L 14(L) - 20  ALT 0 - 44 U/L 16 - 21     RADIOGRAPHIC STUDIES: I have personally reviewed the radiological images as listed and agreed with the findings in the report. CT Angio  Chest PE W/Cm &/Or Wo Cm  Result Date: 11/18/2021 CLINICAL DATA:  Short of breath, positive D-dimer EXAM: CT ANGIOGRAPHY CHEST WITH CONTRAST TECHNIQUE: Multidetector CT imaging of the chest was performed using the standard protocol during bolus administration of intravenous contrast. Multiplanar CT image reconstructions and MIPs were obtained to evaluate the vascular anatomy. RADIATION DOSE REDUCTION: This exam was performed according to the departmental dose-optimization program which includes automated exposure control, adjustment of the mA and/or kV according to patient size and/or use of iterative reconstruction technique. CONTRAST:  150m OMNIPAQUE IOHEXOL 350 MG/ML SOLN COMPARISON:  01/24/2020 FINDINGS: Cardiovascular: This is a technically adequate evaluation of the pulmonary vasculature. There are large bilateral pulmonary emboli involving the main pulmonary arteries and segmental branches. Moderate clot burden. There is straightening of the interventricular septum, with RV/LV ratio measuring 1.3 consistent with right heart strain. No pericardial effusion. No evidence of thoracic aortic aneurysm or dissection. Mild atherosclerosis of the aorta and coronary vasculature. Mediastinum/Nodes: No enlarged mediastinal, hilar, or axillary lymph nodes. Thyroid gland, trachea, and esophagus demonstrate no significant findings. Lungs/Pleura: No acute airspace disease, effusion, or pneumothorax. Central airways are patent. Upper Abdomen: No acute abnormality. Musculoskeletal: No acute or destructive bony lesions. Reconstructed images demonstrate no additional findings. Review of the MIP images confirms the above findings. IMPRESSION: 1. Large bilateral pulmonary emboli, with CT evidence of right heart strain (RV/LV Ratio = 1.3) consistent with at least submassive (intermediate risk) PE. The presence of right heart strain has been associated with an increased risk of morbidity and mortality. Please refer to the "PE  Focused" order set in EPIC. 2.  Aortic Atherosclerosis (ICD10-I70.0). Critical Value/emergent results were called by telephone at the time of interpretation on 11/18/2021 at 5:48 pm to provider [place], who verbally acknowledged these results. Electronically Signed   By: MRanda NgoM.D.   On: 11/18/2021 17:51   ECHOCARDIOGRAM COMPLETE  Result  Date: 11/19/2021    ECHOCARDIOGRAM REPORT   Patient Name:   John Mcmahon Date of Exam: 11/19/2021 Medical Rec #:  [id number]       Height:       70.0 in Accession #:    [id number]      Weight:       168.9 lb Date of Birth:  May 08, 1946       BSA:          1.943 m Patient Age:    75 years        BP:           129/81 mmHg Patient Gender: M               HR:           72 bpm. Exam Location:  Inpatient Procedure: 2D Echo, Cardiac Doppler and Color Doppler Indications:    Pulmonary Embolus  History:        Patient has no prior history of Echocardiogram examinations.                 CAD; Risk Factors:Hypertension.  Sonographer:    Glo Herring Referring Phys: Carlos  1. Left ventricular ejection fraction, by estimation, is 60 to 65%. The left ventricle has normal function. The left ventricle has no regional wall motion abnormalities. Left ventricular diastolic parameters are indeterminate.  2. Right ventricular systolic function is normal. The right ventricular size is normal. Tricuspid regurgitation signal is inadequate for assessing PA pressure.  3. The mitral valve was not well visualized. No evidence of mitral valve regurgitation.  4. The aortic valve was not well visualized. Aortic valve regurgitation is trivial.  5. The inferior vena cava is normal in size with greater than 50% respiratory variability, suggesting right atrial pressure of 3 mmHg. Comparison(s): No prior Echocardiogram. FINDINGS  Left Ventricle: Left ventricular ejection fraction, by estimation, is 60 to 65%. The left ventricle has normal function. The left ventricle has no  regional wall motion abnormalities. The left ventricular internal cavity size was normal in size. There is  no left ventricular hypertrophy. Left ventricular diastolic parameters are indeterminate. Right Ventricle: The right ventricular size is normal. No increase in right ventricular wall thickness. Right ventricular systolic function is normal. Tricuspid regurgitation signal is inadequate for assessing PA pressure. Left Atrium: Left atrial size was normal in size. Right Atrium: Right atrial size was normal in size. Pericardium: There is no evidence of pericardial effusion. Mitral Valve: The mitral valve was not well visualized. No evidence of mitral valve regurgitation. Tricuspid Valve: The tricuspid valve is not well visualized. Tricuspid valve regurgitation is not demonstrated. Aortic Valve: The aortic valve was not well visualized. Aortic valve regurgitation is trivial. Aortic valve mean gradient measures 3.5 mmHg. Aortic valve peak gradient measures 5.9 mmHg. Aortic valve area, by VTI measures 2.44 cm. Pulmonic Valve: The pulmonic valve was not well visualized. Aorta: The aortic root and ascending aorta are structurally normal, with no evidence of dilitation. Venous: The inferior vena cava is normal in size with greater than 50% respiratory variability, suggesting right atrial pressure of 3 mmHg. IAS/Shunts: The interatrial septum was not well visualized.  LEFT VENTRICLE PLAX 2D LVOT diam:     2.00 cm   Diastology LV SV:         58        LV e' medial:  4.46 cm/s LV SV Index:   30  LV e' lateral: 6.96 cm/s LVOT Area:     3.14 cm  RIGHT VENTRICLE             IVC RV Basal diam:  3.60 cm     IVC diam: 1.90 cm RV Mid diam:    2.60 cm RV S prime:     14.60 cm/s LEFT ATRIUM             Index        RIGHT ATRIUM           Index LA Vol (A2C):   48.4 ml 24.92 ml/m  RA Area:     15.30 cm LA Vol (A4C):   44.8 ml 23.06 ml/m  RA Volume:   35.30 ml  18.17 ml/m LA Biplane Vol: 47.2 ml 24.30 ml/m  AORTIC VALVE                     PULMONIC VALVE AV Area (Vmax):    2.52 cm     PV Vmax:       0.73 m/s AV Area (Vmean):   2.54 cm     PV Peak grad:  2.1 mmHg AV Area (VTI):     2.44 cm AV Vmax:           121.00 cm/s AV Vmean:          84.400 cm/s AV VTI:            0.237 m AV Peak Grad:      5.9 mmHg AV Mean Grad:      3.5 mmHg LVOT Vmax:         96.90 cm/s LVOT Vmean:        68.300 cm/s LVOT VTI:          0.184 m LVOT/AV VTI ratio: 0.78  AORTA Ao Root diam: 3.90 cm Ao Asc diam:  3.60 cm  SHUNTS Systemic VTI:  0.18 m Systemic Diam: 2.00 cm Phineas Inches Electronically signed by Phineas Inches Signature Date/Time: 11/19/2021/10:30:45 AM    Final    VAS [place] LOWER EXTREMITY VENOUS (DVT) (ONLY [facility])  Result Date: 11/19/2021  Lower Venous DVT Study Patient Name:  John Mcmahon  Date of Exam:   11/18/2021 Medical Rec #: [id number]      Accession #:    [id number] Date of Birth: September 01, 1946      Patient Gender: M Patient Age:   89 years Exam Location:  [facility] Procedure:      VAS [place] LOWER EXTREMITY VENOUS (DVT) Referring Phys: Marda Stalker --------------------------------------------------------------------------------  Indications: Pulmonary embolism.  Risk Factors: Confirmed PE. Anticoagulation: Heparin. Comparison Study: No prior studies. Performing Technologist: Oliver Hum RVT  Examination Guidelines: A complete evaluation includes B-mode imaging, spectral Doppler, color Doppler, and power Doppler as needed of all accessible portions of each vessel. Bilateral testing is considered an integral part of a complete examination. Limited examinations for reoccurring indications may be performed as noted. The reflux portion of the exam is performed with the patient in reverse Trendelenburg.  +---------+---------------+---------+-----------+----------+-----------------+  RIGHT     Compressibility Phasicity Spontaneity Properties Thrombus Aging      +---------+---------------+---------+-----------+----------+-----------------+  CFV       Full            Yes       Yes                                       +---------+---------------+---------+-----------+----------+-----------------+  SFJ       Full                                                                +---------+---------------+---------+-----------+----------+-----------------+  FV Prox   Partial         Yes       Yes                    Age Indeterminate  +---------+---------------+---------+-----------+----------+-----------------+  FV Mid    Partial         Yes       Yes                    Age Indeterminate  +---------+---------------+---------+-----------+----------+-----------------+  FV Distal None            No        No                     Age Indeterminate  +---------+---------------+---------+-----------+----------+-----------------+  PFV       Full                                                                +---------+---------------+---------+-----------+----------+-----------------+  POP       Full            Yes       Yes                                       +---------+---------------+---------+-----------+----------+-----------------+  PTV       Full                                                                +---------+---------------+---------+-----------+----------+-----------------+  PERO      Partial                                          Age Indeterminate  +---------+---------------+---------+-----------+----------+-----------------+   +---------+---------------+---------+-----------+----------+--------------+  LEFT      Compressibility Phasicity Spontaneity Properties Thrombus Aging  +---------+---------------+---------+-----------+----------+--------------+  CFV       Full            Yes       Yes                                    +---------+---------------+---------+-----------+----------+--------------+  SFJ       Full                                                              +---------+---------------+---------+-----------+----------+--------------+  FV Prox   Full                                                             +---------+---------------+---------+-----------+----------+--------------+  FV Mid    Full                                                             +---------+---------------+---------+-----------+----------+--------------+  FV Distal Full                                                             +---------+---------------+---------+-----------+----------+--------------+  PFV       Full                                                             +---------+---------------+---------+-----------+----------+--------------+  POP       Full            Yes       Yes                                    +---------+---------------+---------+-----------+----------+--------------+  PTV       Full                                                             +---------+---------------+---------+-----------+----------+--------------+  PERO      Full                                                             +---------+---------------+---------+-----------+----------+--------------+     Summary: RIGHT: - Findings consistent with age indeterminate deep vein thrombosis involving the right femoral vein, and right peroneal veins. - No cystic structure found in the popliteal fossa.  LEFT: - There is no evidence of deep vein thrombosis in the lower extremity.  - No cystic structure found in the popliteal fossa.  *See table(s) above for measurements and observations. Electronically signed by Orlie Pollen on 11/19/2021 at 8:42:50 AM.    Final     ASSESSMENT & PLAN:   Very pleasant 76 year old gentleman with a history of hypertension, dyslipidemia, nonobstructive CAD with  1) Unprovoked bilateral submassive pulmonary embolism without clear provoking factors. 2) right lower extremity DVT -age-indeterminate. PLAN -Patient's chart was reviewed in detail including his  hospitalization and other work-up. -We discussed his available ultrasound CTA chest and echo results in details. -Patient on detailed discussion does not appear to have a clear notable provoking risk factor to explain his significant potentially life-threatening pulmonary embolism. -We discussed that in the absence of any clear provoking factors we would recommend lifelong anticoagulation given his extensive event. -We discussed that in the absence of a family history of VTE and no previous personal history of VTE presence of an inherited thrombophilia is less likely. -We discussed pros and cons of getting a hypercoagulability work-up and the fact that even if this were negative as can be the case and about 50% of patients with a history suggestive of her inherited thrombophilia - we would still recommend due to high risk of repeat DVT. (About 40-50% of patients in 3 years). -Patient wanted to hold off on hypercoagulability work-up at this time. -Follow-up with primary care physician for age-appropriate cancer screening. -Currently does not have any focal symptoms suggestive of malignancy. -Does not endorse recent COVID-19 infection which could be a trigger for VTE. -Discussed maintaining good hydration, staying physically active, using compression socks recommendations to reduce her overall risk of recurrent VTE. -Continue Eliquis 5 mg p.o. twice daily -We shall see him back in 4 months with repeat labs to determine if any other symptoms or concerns arise   All of the patients questions were answered with apparent satisfaction. The patient knows to call the clinic with any problems, questions or concerns.  I spent  40 mins counseling the patient face to face. The total time spent in the appointment was 50 mins and more than 50% was on counseling and direct patient cares.    Sullivan Lone MD [place] AAHIVMS [facility] Hematology/Oncology Physician [facility]  (Office):        [phone] (Work cell):  [phone] (Fax):           [phone]  12/02/2021 2:12 PM

## 2021-12-04 ENCOUNTER — Encounter: Payer: Self-pay | Admitting: Internal Medicine

## 2021-12-14 ENCOUNTER — Other Ambulatory Visit: Payer: Medicare Other

## 2021-12-16 NOTE — Addendum Note (Signed)
Addended by: Thayer Headings, Marne Meline L on: 12/16/2021 01:53 PM   Modules accepted: Orders

## 2021-12-21 ENCOUNTER — Ambulatory Visit: Payer: Medicare Other | Admitting: Nurse Practitioner

## 2021-12-22 ENCOUNTER — Telehealth: Payer: Self-pay | Admitting: Internal Medicine

## 2021-12-22 NOTE — Telephone Encounter (Signed)
Called and spoke with pt.  Notified that we have not received results from [facility] at this time.  I verified on ziosuite that monitor is en route via mail back to [facility]io.  We will contact pt once MD has reviewed and resulted monitor.  Pt appreciative of info and has no further needs at this time.

## 2021-12-22 NOTE — Telephone Encounter (Signed)
Patient calling to discuss [facility] results

## 2021-12-29 NOTE — Telephone Encounter (Signed)
Swinyer, Zachary George, NP  P Cv Div [place] Triage ?Results as noted, predominant sinus rhythm with rare PACs and PVCs, multiple episodes of brief PSVT. If he is feeling well and not having symptoms of palpitations or other concerns then I would not recommend adding medication since he is recovering from recent PE.  ? ?Called and notified pt of event monitor results per above.  ?Pt states "I am doing really well, as I am slow getting back to normal, I am feeling good." ?Pt voiced understanding and will follow up as scheduled with Dr. Okey Dupre 03/02/22.  ?Pt understands to let us know of any changes or concerns in the meantime.  ?Pt has no further questions at this time.  ?

## 2021-12-29 NOTE — Telephone Encounter (Signed)
Patient calling to check on status.

## 2022-01-06 ENCOUNTER — Ambulatory Visit: Payer: Medicare Other | Admitting: Internal Medicine

## 2022-01-18 ENCOUNTER — Other Ambulatory Visit: Payer: Self-pay | Admitting: Cardiovascular Disease

## 2022-02-21 DIAGNOSIS — D1801 Hemangioma of skin and subcutaneous tissue: Secondary | ICD-10-CM | POA: Diagnosis not present

## 2022-02-21 DIAGNOSIS — D2262 Melanocytic nevi of left upper limb, including shoulder: Secondary | ICD-10-CM | POA: Diagnosis not present

## 2022-02-21 DIAGNOSIS — L570 Actinic keratosis: Secondary | ICD-10-CM | POA: Diagnosis not present

## 2022-02-21 DIAGNOSIS — L80 Vitiligo: Secondary | ICD-10-CM | POA: Diagnosis not present

## 2022-02-21 DIAGNOSIS — Z85828 Personal history of other malignant neoplasm of skin: Secondary | ICD-10-CM | POA: Diagnosis not present

## 2022-03-02 ENCOUNTER — Encounter: Payer: Self-pay | Admitting: Internal Medicine

## 2022-03-02 ENCOUNTER — Ambulatory Visit: Payer: Medicare Other | Admitting: Internal Medicine

## 2022-03-02 VITALS — BP 134/80 | HR 82 | Ht 70.0 in | Wt 172.0 lb

## 2022-03-02 DIAGNOSIS — Z86711 Personal history of pulmonary embolism: Secondary | ICD-10-CM | POA: Diagnosis not present

## 2022-03-02 DIAGNOSIS — E785 Hyperlipidemia, unspecified: Secondary | ICD-10-CM | POA: Diagnosis not present

## 2022-03-02 DIAGNOSIS — I2510 Atherosclerotic heart disease of native coronary artery without angina pectoris: Secondary | ICD-10-CM | POA: Diagnosis not present

## 2022-03-02 NOTE — Progress Notes (Signed)
? ?Follow-up Outpatient Visit ?Date: 03/02/2022 ? ?Primary Care Provider: ?Ginger Organ., MD ?[address] ?[place] [address] ? ?Chief Complaint: Follow-up coronary artery disease and pulmonary embolism ? ?HPI:  John Mcmahon is a 76 y.o. male with history of coronary artery disease with moderate multivessel CAD, unprovoked submassive pulmonary embolism (10/2021), thoracic aortic aneurysm with mildly dilated aortic arch (followed by Dr. Brigitte Pulse) hyperlipidemia, and impaired fasting glucose, who presents for follow-up of coronary artery disease and pulmonary embolism.  I last saw him in early February, at which time John Mcmahon was feeling well other than mild fatigue since his PE a few weeks earlier.  He was seen by hematology after our last visit; indefinite anticoagulation was recommended.  Hypercoagulable work-up was deferred given that it would not change his long-term management. ? ?Today, [name] reports that he overall is feeling fairly well.  He still gets fatigued and out of breath sometimes with exertion, though at other times he can do strenuous activities without any difficulty.  He denies chest pain, palpitations, lightheadedness, and edema.  He recently began wearing a smart watch, which has recorded some overnight oxygen saturations into the 70s.  However, he is not sure that the watch is appropriately positioned on his arm during those times.  He reports having undergone a sleep study over 20 years ago and believes it was unremarkable.  He generally does not sleep all that well at night but has actually been sleeping a little bit better since our last visit.  He has not been told that he snores.  Since our last visit, when isosorbide mononitrate was discontinued, John Mcmahon has not felt any different. ? ?-------------------------------------------------------------------------------------------------- ? ?Cardiovascular History & Procedures: ?Cardiovascular Problems: ?Unprovoked submassive  pulmonary embolism (10/2021) ?Mild to moderate coronary artery disease ?Thoracic aortic aneurysm (mild dilation of aortic arch) ?  ?Risk Factors: ?Known coronary artery disease, hyperlipidemia, male gender, age greater than 55 ?  ?Cath/PCI: ?LHC (08/07/17): LMCA normal. LAD with 50% midvessel stenosis. Codominant LCx with 40% lPDA lesion. 50% proximal and 25% mid RCA lesions. LVEF 55-65%. LVEDP 10 mmHg. ?  ?CV Surgery: ?None ?  ?EP Procedures and Devices: ?None ?  ?Non-Invasive Evaluation(s): ?TTE (11/19/2021): Normal LV size and wall thickness.  LVEF 60-65% with indeterminate diastolic function.  Normal wall motion.  Normal RV size and function.  Unable to assess PA pressure.  Normal biatrial size.  No significant valvular abnormality. ?Lower extremity venous duplex (11/18/2021): Findings consistent with age-indeterminate DVT involving the right femoral and peroneal veins. ?Exercise MPI (06/03/2021): Low risk study without ischemia or scar.  Good exercise capacity, achieving 10.9 METS and 95% MPHR. ?Coronary calcium score (06/29/17): Calcium score of 952 with extensive calcifications in the LAD him a as well as less prominent calcification involving the LMCA, LCx, and RCA. Slight aneurysmal dilation of ascending aortic, measuring 4.0 cm. 18 mm left lower lobe nodule. ? ?Recent CV Pertinent Labs: ?Lab Results  ?Component Value Date  ? CHOL 117 05/27/2021  ? HDL 45 05/27/2021  ? [place] 53 05/27/2021  ? TRIG 102 05/27/2021  ? CHOLHDL 2.6 05/27/2021  ? INR 1.06 12/15/2018  ? BNP 56.9 11/18/2021  ? K 4.0 11/19/2021  ? MG 2.1 11/18/2021  ? BUN 14 11/19/2021  ? BUN 18 05/27/2021  ? CREATININE 0.94 11/19/2021  ? ? ?Past medical and surgical history were reviewed and updated in EPIC. ? ?Current Meds  ?Medication Sig  ? amoxicillin (AMOXIL) 500 MG capsule Take 2,000 mg by mouth  See admin instructions. Take 2,000 mg by mouth one hour prior to dental treatment or as otherwise instructed  ? apixaban (ELIQUIS) 5 MG TABS tablet Take 1  tablet (5 mg total) by mouth 2 (two) times daily.  ? bisacodyl (DULCOLAX) 5 MG EC tablet Take 5 mg by mouth daily as needed for mild constipation or moderate constipation.  ? nitroGLYCERIN (NITROSTAT) 0.4 MG SL tablet Place 1 tablet (0.4 mg total) under the tongue every 5 (five) minutes as needed for chest pain.  ? REPATHA SURECLICK 725 MG/ML SOAJ INJECT 1 PEN UNDER THE SKIN EVERY 14 DAYS  ? ? ?Allergies: Other and Atorvastatin ? ?Social History  ? ?Tobacco Use  ? Smoking status: Former  ?  Packs/day: 1.00  ?  Years: 2.00  ?  Pack years: 2.00  ?  Types: Cigarettes  ?  Quit date: 08/03/1969  ?  Years since quitting: 52.6  ? Smokeless tobacco: Never  ?Vaping Use  ? Vaping Use: Never used  ?Substance Use Topics  ? Alcohol use: Yes  ?  Alcohol/week: 4.0 standard drinks  ?  Types: 4 Standard drinks or equivalent per week  ?  Comment: 1 drink every other day  ? Drug use: No  ? ? ?Family History  ?Problem Relation Age of Onset  ? Multiple myeloma Mother   ? Cancer - Other Mother 89  ?     BREAST  ? Other Mother   ?     sepsis  ? Dementia Father   ? Cancer Father   ?     neck   ? Cancer - Other Maternal Grandmother   ? Diabetes Maternal Grandmother   ? Heart attack Maternal Grandfather 65  ? Dementia Paternal Grandfather   ? Healthy Son   ? Healthy Son   ? Healthy Daughter   ? Heart attack Cousin   ? Cancer Paternal Grandmother   ? ? ?Review of Systems: ?A 12-system review of systems was performed and was negative except as noted in the HPI. ? ?-------------------------------------------------------------------------------------------------- ? ?Physical Exam: ?BP 134/80 (BP Location: Left Arm, Patient Position: Sitting, Cuff Size: Normal)   Pulse 82   Ht 5' 10"  (1.778 m)   Wt 172 lb (78 kg)   SpO2 97%   BMI 24.68 kg/m?  ? ?General:  NAD. ?Neck: No JVD or HJR. ?Lungs: Clear to auscultation bilaterally without wheezes or crackles. ?Heart: Regular rate and rhythm without murmurs, rubs, or gallops. ?Abdomen: Soft,  nontender, nondistended. ?Extremities: No lower extremity edema. ? ?Lab Results  ?Component Value Date  ? WBC 5.9 11/21/2021  ? HGB 14.4 11/21/2021  ? HCT 43.4 11/21/2021  ? MCV 90.4 11/21/2021  ? PLT 147 (L) 11/21/2021  ? ? ?Lab Results  ?Component Value Date  ? NA 138 11/19/2021  ? K 4.0 11/19/2021  ? CL 104 11/19/2021  ? CO2 26 11/19/2021  ? BUN 14 11/19/2021  ? CREATININE 0.94 11/19/2021  ? GLUCOSE 189 (H) 11/19/2021  ? ALT 16 11/19/2021  ? ? ?Lab Results  ?Component Value Date  ? CHOL 117 05/27/2021  ? HDL 45 05/27/2021  ? [place] 53 05/27/2021  ? TRIG 102 05/27/2021  ? CHOLHDL 2.6 05/27/2021  ? ? ?-------------------------------------------------------------------------------------------------- ? ?ASSESSMENT AND PLAN: ?Coronary artery disease: ?No chest pain reported.  Mr. Hubble notes sporadic dyspnea on exertion, improved from prior visits.  I am not convinced that this is due to worsening coronary insufficiency.  We will defer medication changes at this time.  Continue aggressive  lipid therapy to prevent progression of disease.  We will not restart aspirin given long-term anticoagulation with apixaban. ? ?History of pulmonary embolism: ?I wonder if occasional dyspnea on exertion may be residual effects from submassive pulmonary embolism earlier this year.  If this worsens, we may need to repeat an echocardiogram or even consider right heart catheterization to ensure that pulmonary hypertension is not developing.  I agree with hematology recommendations of indefinite anticoagulation. ? ?Hyperlipidemia: ?Last lipid panel through our office in 04/2021 was excellent with an LDL of 53.  We will continue evolocumab, with ongoing lipid follow-up per Dr. Brigitte Pulse. ? ?Follow-up: Return to clinic in 6 months. ? ?Nelva Bush, MD ?03/02/2022 ?10:26 AM ? ?

## 2022-03-02 NOTE — Patient Instructions (Signed)

## 2022-03-30 ENCOUNTER — Other Ambulatory Visit: Payer: Self-pay

## 2022-03-30 DIAGNOSIS — I82401 Acute embolism and thrombosis of unspecified deep veins of right lower extremity: Secondary | ICD-10-CM | POA: Diagnosis not present

## 2022-03-30 DIAGNOSIS — I2609 Other pulmonary embolism with acute cor pulmonale: Secondary | ICD-10-CM

## 2022-03-30 DIAGNOSIS — E785 Hyperlipidemia, unspecified: Secondary | ICD-10-CM | POA: Diagnosis not present

## 2022-04-01 ENCOUNTER — Inpatient Hospital Stay: Payer: Medicare Other

## 2022-04-01 ENCOUNTER — Inpatient Hospital Stay: Payer: Medicare Other | Attending: Hematology | Admitting: Hematology

## 2022-04-01 ENCOUNTER — Other Ambulatory Visit: Payer: Self-pay

## 2022-04-01 VITALS — BP 157/87 | HR 73 | Temp 98.0°F | Resp 17 | Ht 70.0 in | Wt 172.9 lb

## 2022-04-01 DIAGNOSIS — I10 Essential (primary) hypertension: Secondary | ICD-10-CM | POA: Diagnosis not present

## 2022-04-01 DIAGNOSIS — Z8249 Family history of ischemic heart disease and other diseases of the circulatory system: Secondary | ICD-10-CM | POA: Insufficient documentation

## 2022-04-01 DIAGNOSIS — Z808 Family history of malignant neoplasm of other organs or systems: Secondary | ICD-10-CM | POA: Diagnosis not present

## 2022-04-01 DIAGNOSIS — Z7901 Long term (current) use of anticoagulants: Secondary | ICD-10-CM | POA: Insufficient documentation

## 2022-04-01 DIAGNOSIS — I2510 Atherosclerotic heart disease of native coronary artery without angina pectoris: Secondary | ICD-10-CM | POA: Insufficient documentation

## 2022-04-01 DIAGNOSIS — Z79899 Other long term (current) drug therapy: Secondary | ICD-10-CM | POA: Diagnosis not present

## 2022-04-01 DIAGNOSIS — I2699 Other pulmonary embolism without acute cor pulmonale: Secondary | ICD-10-CM | POA: Diagnosis not present

## 2022-04-01 DIAGNOSIS — Z87891 Personal history of nicotine dependence: Secondary | ICD-10-CM | POA: Insufficient documentation

## 2022-04-01 DIAGNOSIS — Z8679 Personal history of other diseases of the circulatory system: Secondary | ICD-10-CM | POA: Diagnosis not present

## 2022-04-01 DIAGNOSIS — Z833 Family history of diabetes mellitus: Secondary | ICD-10-CM | POA: Insufficient documentation

## 2022-04-01 DIAGNOSIS — I2609 Other pulmonary embolism with acute cor pulmonale: Secondary | ICD-10-CM

## 2022-04-01 DIAGNOSIS — I82411 Acute embolism and thrombosis of right femoral vein: Secondary | ICD-10-CM | POA: Diagnosis not present

## 2022-04-01 DIAGNOSIS — Z803 Family history of malignant neoplasm of breast: Secondary | ICD-10-CM | POA: Insufficient documentation

## 2022-04-01 DIAGNOSIS — Z7289 Other problems related to lifestyle: Secondary | ICD-10-CM | POA: Insufficient documentation

## 2022-04-01 DIAGNOSIS — E785 Hyperlipidemia, unspecified: Secondary | ICD-10-CM | POA: Insufficient documentation

## 2022-04-01 LAB — CBC WITH DIFFERENTIAL (CANCER CENTER ONLY)
Abs Immature Granulocytes: 0.01 10*3/uL (ref 0.00–0.07)
Basophils Absolute: 0 10*3/uL (ref 0.0–0.1)
Basophils Relative: 0 %
Eosinophils Absolute: 0.2 10*3/uL (ref 0.0–0.5)
Eosinophils Relative: 3 %
HCT: 44.9 % (ref 39.0–52.0)
Hemoglobin: 15.5 g/dL (ref 13.0–17.0)
Immature Granulocytes: 0 %
Lymphocytes Relative: 30 %
Lymphs Abs: 1.8 10*3/uL (ref 0.7–4.0)
MCH: 31.1 pg (ref 26.0–34.0)
MCHC: 34.5 g/dL (ref 30.0–36.0)
MCV: 90 fL (ref 80.0–100.0)
Monocytes Absolute: 0.4 10*3/uL (ref 0.1–1.0)
Monocytes Relative: 7 %
Neutro Abs: 3.7 10*3/uL (ref 1.7–7.7)
Neutrophils Relative %: 60 %
Platelet Count: 174 10*3/uL (ref 150–400)
RBC: 4.99 MIL/uL (ref 4.22–5.81)
RDW: 12.1 % (ref 11.5–15.5)
WBC Count: 6.2 10*3/uL (ref 4.0–10.5)
nRBC: 0 % (ref 0.0–0.2)

## 2022-04-01 LAB — CMP (CANCER CENTER ONLY)
ALT: 21 U/L (ref 0–44)
AST: 21 U/L (ref 15–41)
Albumin: 3.9 g/dL (ref 3.5–5.0)
Alkaline Phosphatase: 74 U/L (ref 38–126)
Anion gap: 6 (ref 5–15)
BUN: 24 mg/dL — ABNORMAL HIGH (ref 8–23)
CO2: 24 mmol/L (ref 22–32)
Calcium: 9.3 mg/dL (ref 8.9–10.3)
Chloride: 110 mmol/L (ref 98–111)
Creatinine: 0.89 mg/dL (ref 0.61–1.24)
GFR, Estimated: >60 mL/min (ref >60)
Glucose, Bld: 105 mg/dL — ABNORMAL HIGH (ref 70–99)
Potassium: 4.5 mmol/L (ref 3.5–5.1)
Sodium: 140 mmol/L (ref 135–145)
Total Bilirubin: 1.3 mg/dL — ABNORMAL HIGH (ref 0.3–1.2)
Total Protein: 7 g/dL (ref 6.5–8.1)

## 2022-04-01 NOTE — Patient Instructions (Signed)
Thank you for choosing [facility] to provide your care.   Should you have questions after your visit to the [facility] ([facility]), please contact this office at [phone] between 8:30 AM and 4:30 PM.  Voice mails left after 4:00 PM may not be returned until the following business day.  Calls received after 4:30 PM will be answered by an off-site Nurse Triage Line.    Prescription Refills:  Please have your pharmacy contact us directly for most prescription requests.  Contact the office directly for refills of narcotics (pain medications). Allow 48-72 hours for refills.  Appointments: Please contact the [facility] scheduling department [phone] for questions regarding [facility] appointment scheduling.  Contact the schedulers with any scheduling changes so that your appointment can be rescheduled in a timely manner.   Central Scheduling for [facility] [phone] - Call to schedule procedures such as PET scans, CT scans, MRI, Ultrasound, etc.  To afford each patient quality time with our providers, please arrive 30 minutes before your scheduled appointment time.  If you arrive late for your appointment, you may be asked to reschedule.  We strive to give you quality time with our providers, and arriving late affects you and other patients whose appointments are after yours. If you are a no show for multiple scheduled visits, you may be dismissed from the clinic at the providers discretion.     Resources: [facility] Social Workers [phone] for additional information on assistance programs or assistance connecting with community support programs   [place] DSS  [phone]: Information regarding food stamps, Medicaid, and utility assistance [place] Access [place] [phone]   [place] [employer]'s shared-ride transportation service for eligible riders who have a disability that prevents them from riding the fixed route bus.   Medicare Rights Center 800-333-4114  Helps people with Medicare understand their rights and benefits, navigate the Medicare system, and secure the quality healthcare they deserve [employer] 800-227-2345 Assists patients locate various types of support and financial assistance Cancer Care: 1-800-813-HOPE (4673) Provides financial assistance, online support groups, medication/co-pay assistance.   Transportation Assistance for appointments at [facility]:[phone]  Again, thank you for choosing [facility] for your care.       

## 2022-04-07 NOTE — Progress Notes (Signed)
HEMATOLOGY/ONCOLOGY CLINIC NOTE  Date of Service: 04/01/2022  Patient Care Team: Ginger Organ., MD as PCP - General (Internal Medicine) End, Harrell Gave, MD as PCP - Cardiology (Cardiology)  CHIEF COMPLAINTS/PURPOSE OF CONSULTATION:  Follow-up for history of VTE  HISTORY OF PRESENTING ILLNESS: 12/03/2021  John Mcmahon is a wonderful 76 y.o. male who has been referred to [place] by Dr .Brigitte Pulse, Emily Filbert., MD  for evaluation and management of submassive pulmonary embolism and DVT.  Patient has a history of hypertension, dyslipidemia, thoracic aortic aneurysm, nonobstructive CAD who presented to the hospital on 11/18/2021 with a presyncopal episode and new onset shortness of breath for a couple of days prior to hospitalization.  Patient was apparently seen in the cardiology office first and was noted to have significant elevated D-dimers after which she was sent to the emergency room.  Patient had a CTA of the chest on 11/18/2021 which showed large bilateral pulmonary emboli with CT evidence of right heart strain consistent with at least submassive PE.  DVT bilateral lower extremity venous on 11/18/2021 showed age-indeterminate DVT involving the right femoral vein and right peroneal veins.  No DVT on the left side.  Echocardiogram 11/19/2021 showed normal ejection fraction of 60 to 65% with no regional wall motion abnormalities. Normal right ventricular function.  Patient was observed in the ICU on IV heparin and then transition to Eliquis.  Patient has completed his Eliquis load at 10 mg p.o. twice daily and is now down to 5 mg p.o. twice daily. He notes his shortness of breath has significantly improved.  He has not had any further presyncopal symptoms since discharge. No current chest pain dizziness or significant leg pain or swelling.  Patient denies any recent long distance travel. No recent surgeries.  Does note that he had some right calf pain in August 2022 which was  evaluated by PCP and orthopedics and was diagnosed with a bursitis/tendinitis and underwent injection in the right knee with resolution of symptoms. No testosterone use No previous history of VTE prior to this No family history of VTE No bleeding issues from his current anticoagulation. Was on aspirin when he developed his PE and DVT.  INTERVAL HISTORY  John Mcmahon is here for follow-up on his history of DVT and extensive bilateral PE after his last clinic visit with [place] in February 2023. He continues to be on Eliquis without any acute new concerns.  No issues with abnormal bleeding.  Leg swelling chest pain or shortness of breath. No other acute new focal symptoms suggestive of malignancy or any other underlying risk factor for his VTE. Labs done today were discussed in detail with him.  Showed normal CBC and CMP.  MEDICAL HISTORY:  Past Medical History:  Diagnosis Date   Acute meniscal tear, lateral, left, initial encounter    Aortic arch aneurysm ([place])    a. 12/2019 CTA: 3.1cm fusiform Ao arch aneurysm - stable.   CAD (coronary artery disease)    a. 07/2017 Cath: LM nl, LAD 40p, 61m D2 25, RI 432mLCX 40d, RCA 50p, 2510mF 55-65%-->Med rx; b. 05/2021 MV: EF 55%, no ischemia/infarct-->low risk.   ED (erectile dysfunction)    Elevated blood pressure reading in office with white coat syndrome, without diagnosis of hypertension    Hyperlipidemia    IFG (impaired fasting glucose)    Insomnia    Lumbago     SURGICAL HISTORY: Past Surgical History:  Procedure Laterality Date   CARDIAC CATHETERIZATION  CATARACT EXTRACTION     COLONOSCOPY     LEFT HEART CATH AND CORONARY ANGIOGRAPHY N/A 08/07/2017   Procedure: LEFT HEART CATH AND CORONARY ANGIOGRAPHY;  Surgeon: Nelva Bush, MD;  Location: [facility];  Service: Cardiovascular;  Laterality: N/A;    SOCIAL HISTORY: Social History   Socioeconomic History   Marital status: Married    Spouse name: Not on file    Number of children: 3   Years of education: Not on file   Highest education level: Professional school degree (e.g., MD, DDS, DVM, JD)  Occupational History   Not on file  Tobacco Use   Smoking status: Former    Packs/day: 1.00    Years: 2.00    Total pack years: 2.00    Types: Cigarettes    Quit date: 08/03/1969    Years since quitting: 52.7   Smokeless tobacco: Never  Vaping Use   Vaping Use: Never used  Substance and Sexual Activity   Alcohol use: Yes    Alcohol/week: 4.0 standard drinks of alcohol    Types: 4 Standard drinks or equivalent per week    Comment: 1 drink every other day   Drug use: No   Sexual activity: Not on file  Other Topics Concern   Not on file  Social History Narrative   Lives at home with spouse    Right handed   Caffeine: 1 cup tea daily   Social Determinants of Health   Financial Resource Strain: Not on file  Food Insecurity: Not on file  Transportation Needs: Not on file  Physical Activity: Not on file  Stress: Not on file  Social Connections: Not on file  Intimate Partner Violence: Not on file    FAMILY HISTORY: Family History  Problem Relation Age of Onset   Multiple myeloma Mother    Cancer - Other Mother 48       BREAST   Other Mother        sepsis   Dementia Father    Cancer Father        neck    Cancer - Other Maternal Grandmother    Diabetes Maternal Grandmother    Heart attack Maternal Grandfather 60   Dementia Paternal Grandfather    Healthy Son    Healthy Son    Healthy Daughter    Heart attack Cousin    Cancer Paternal Grandmother   Mother - Myeloma  ALLERGIES:  is allergic to other and atorvastatin.  MEDICATIONS:  Current Outpatient Medications  Medication Sig Dispense Refill   amoxicillin (AMOXIL) 500 MG capsule Take 2,000 mg by mouth See admin instructions. Take 2,000 mg by mouth one hour prior to dental treatment or as otherwise instructed     apixaban (ELIQUIS) 5 MG TABS tablet Take 1 tablet (5 mg total)  by mouth 2 (two) times daily. 60 tablet 5   bisacodyl (DULCOLAX) 5 MG EC tablet Take 5 mg by mouth daily as needed for mild constipation or moderate constipation.     nitroGLYCERIN (NITROSTAT) 0.4 MG SL tablet Place 1 tablet (0.4 mg total) under the tongue every 5 (five) minutes as needed for chest pain. 25 tablet 3   REPATHA SURECLICK 025 MG/ML SOAJ INJECT 1 PEN UNDER THE SKIN EVERY 14 DAYS 6 mL 0   No current facility-administered medications for this visit.    REVIEW OF SYSTEMS:    10 Point review of Systems was done is negative except as noted above.  PHYSICAL EXAMINATION: ECOG PERFORMANCE  STATUS:   . Vitals:   04/01/22 1237  BP: (!) 157/87  Pulse: 73  Resp: 17  Temp: 98 F (36.7 C)  SpO2: 98%   Filed Weights   04/01/22 1237  Weight: 172 lb 14.4 oz (78.4 kg)   .Body mass index is 24.81 kg/m. NAD GENERAL:alert, in no acute distress and comfortable SKIN: no acute rashes, no significant lesions EYES: conjunctiva are pink and non-injected, sclera anicteric OROPHARYNX: MMM, no exudates, no oropharyngeal erythema or ulceration NECK: supple, no JVD LYMPH:  no palpable lymphadenopathy in the cervical, axillary or inguinal regions LUNGS: clear to auscultation b/l with normal respiratory effort HEART: regular rate & rhythm ABDOMEN:  normoactive bowel sounds , non tender, not distended. Extremity: no pedal edema PSYCH: alert & oriented x 3 with fluent speech NEURO: no focal motor/sensory deficits   LABORATORY DATA:  I have reviewed the data as listed  .    Latest Ref Rng & Units 04/01/2022   11:46 AM 11/21/2021    4:12 AM 11/20/2021    5:51 AM  CBC  WBC 4.0 - 10.5 K/uL 6.2  5.9  6.0   Hemoglobin 13.0 - 17.0 g/dL 15.5  14.4  15.2   Hematocrit 39.0 - 52.0 % 44.9  43.4  46.0   Platelets 150 - 400 K/uL 174  147  134     .    Latest Ref Rng & Units 04/01/2022   11:46 AM 11/19/2021    3:27 AM 11/18/2021    4:00 PM  CMP  Glucose 70 - 99 mg/dL 105  189  171   BUN 8 - 23  mg/dL 24  14  21    Creatinine 0.61 - 1.24 mg/dL 0.89  0.94  1.11   Sodium 135 - 145 mmol/L 140  138  142   Potassium 3.5 - 5.1 mmol/L 4.5  4.0  4.3   Chloride 98 - 111 mmol/L 110  104  100   CO2 22 - 32 mmol/L 24  26  26    Calcium 8.9 - 10.3 mg/dL 9.3  8.4  9.8   Total Protein 6.5 - 8.1 g/dL 7.0  6.0    Total Bilirubin 0.3 - 1.2 mg/dL 1.3  1.5    Alkaline Phos 38 - 126 U/L 74  64    AST 15 - 41 U/L 21  14    ALT 0 - 44 U/L 21  16       RADIOGRAPHIC STUDIES: I have personally reviewed the radiological images as listed and agreed with the findings in the report. No results found.  ASSESSMENT & PLAN:   Very pleasant 76 year old gentleman with a history of hypertension, dyslipidemia, nonobstructive CAD with  1) Unprovoked bilateral submassive pulmonary embolism without clear provoking factors. 2) right lower extremity DVT -age-indeterminate. We previously had discussed pros and cons of getting a hypercoagulability work-up and the fact that even if this were negative as can be the case and about 50% of patients with a history suggestive of inherited thrombophilia - we would still recommend due to high risk of repeat DVT. (About 40-50% of patients in 3 years). Patient wanted to hold off on hypercoagulability work-up at this time. PLAN -Patient's labs from today were discussed with him in detail.  CBC and CMP stable. -Patient has had no notable toxicities or bleeding issues on his therapeutic Eliquis. -We discussed that we would recommend long-term Eliquis given the unprovoked nature of his extensive PE and DVT. -Patient has no focal symptoms suggestive  of malignancy at this time. -He will continue to follow with his primary care physician for long-term anticoagulation further refills of his Eliquis. -Continue age-appropriate cancer screening with PCP. -Discussed maintaining good hydration, staying physically active, using compression socks recommendations to reduce her overall risk of  recurrent VTE. -Continue Eliquis 5 mg p.o. twice daily  Follow-up Return to care with PCP we shall see him back as needed.  The total time spent in the appointment was 20 minutes*.  All of the patient's questions were answered with apparent satisfaction. The patient knows to call the clinic with any problems, questions or concerns.   Sullivan Lone MD MS AAHIVMS [facility] Hematology/Oncology Physician [facility]  .*Total Encounter Time as defined by the Centers for Medicare and Medicaid Services includes, in addition to the face-to-face time of a patient visit (documented in the note above) non-face-to-face time: obtaining and reviewing outside history, ordering and reviewing medications, tests or procedures, care coordination (communications with other health care professionals or caregivers) and documentation in the medical record.

## 2022-04-12 ENCOUNTER — Other Ambulatory Visit: Payer: Self-pay | Admitting: Internal Medicine

## 2022-04-12 NOTE — Telephone Encounter (Signed)
Please advise if OK to refill or defer to PCP. Thank you!  Per Dr. Serita Kyle last office note on 03/04/22: Hyperlipidemia: Last lipid panel through our office in 04/2021 was excellent with an LDL of 53.  We will continue evolocumab, with ongoing lipid follow-up per Dr. Clelia Croft.

## 2022-07-02 ENCOUNTER — Other Ambulatory Visit: Payer: Self-pay | Admitting: Internal Medicine

## 2022-08-01 DIAGNOSIS — H524 Presbyopia: Secondary | ICD-10-CM | POA: Diagnosis not present

## 2022-08-31 NOTE — Progress Notes (Signed)
PCP notes requested.  

## 2022-09-05 DIAGNOSIS — Z125 Encounter for screening for malignant neoplasm of prostate: Secondary | ICD-10-CM | POA: Diagnosis not present

## 2022-09-05 DIAGNOSIS — E785 Hyperlipidemia, unspecified: Secondary | ICD-10-CM | POA: Diagnosis not present

## 2022-09-05 DIAGNOSIS — I700 Atherosclerosis of aorta: Secondary | ICD-10-CM | POA: Diagnosis not present

## 2022-09-05 DIAGNOSIS — R7301 Impaired fasting glucose: Secondary | ICD-10-CM | POA: Diagnosis not present

## 2022-09-05 NOTE — Progress Notes (Unsigned)
Follow-up Outpatient Visit Date: 09/07/2022  Primary Care Provider: Ginger Organ., MD [place] [address]  Chief Complaint: Follow-up coronary artery disease and pulmonary embolism  HPI:  John Mcmahon is a 76 y.o. male with history of coronary artery disease with moderate multivessel CAD, unprovoked submassive pulmonary embolism (10/2021), thoracic aortic aneurysm with mildly dilated aortic arch (followed by Dr. Brigitte Pulse) hyperlipidemia, and type 2 diabetes mellitus, who presents for follow-up of coronary artery disease and pulmonary embolism.  I last saw him in May, at which time he was feeling fairly well, though he noted some continued dyspnea on exertion and generalized fatigue.  We did not make any medication changes or pursue further testing.  He was evaluated by hematology with recommendations to continue indefinite anticoagulation.  Today, John Mcmahon reports that he is feeling quite well though on further questioning he notes quite a bit of fatigue.  He reports both some muscle fatigue and soreness as well as somnolence with difficulty sleeping at night.  He sometimes have to still lie down during the day but usually does not nap.  He has not had any chest pain or tightness or significant dyspnea.  He also denies lightheadedness, edema, and palpitations.  He remains compliant with his medications.  He has not had any significant bleeding.  He brings results of recent labs ordered by Dr. Brigitte Pulse with him today, which were notable for a fasting glucose of 279 and hemoglobin A1c of 9.2%.  --------------------------------------------------------------------------------------------------  Cardiovascular History & Procedures: Cardiovascular Problems: Unprovoked submassive pulmonary embolism (10/2021) Mild to moderate coronary artery disease Thoracic aortic aneurysm (mild dilation of aortic arch)   Risk Factors: Known coronary artery disease, hyperlipidemia, male gender, age  greater than 30   Cath/PCI: LHC (08/07/17): LMCA normal. LAD with 50% midvessel stenosis. Codominant LCx with 40% lPDA lesion. 50% proximal and 25% mid RCA lesions. LVEF 55-65%. LVEDP 10 mmHg.   CV Surgery: None   EP Procedures and Devices: None   Non-Invasive Evaluation(s): TTE (11/19/2021): Normal LV size and wall thickness.  LVEF 60-65% with indeterminate diastolic function.  Normal wall motion.  Normal RV size and function.  Unable to assess PA pressure.  Normal biatrial size.  No significant valvular abnormality. Lower extremity venous duplex (11/18/2021): Findings consistent with age-indeterminate DVT involving the right femoral and peroneal veins. Exercise MPI (06/03/2021): Low risk study without ischemia or scar.  Good exercise capacity, achieving 10.9 METS and 95% MPHR. Coronary calcium score (06/29/17): Calcium score of 952 with extensive calcifications in the LAD him a as well as less prominent calcification involving the LMCA, LCx, and RCA. Slight aneurysmal dilation of ascending aortic, measuring 4.0 cm. 18 mm left lower lobe nodule.  Recent CV Pertinent Labs: Lab Results  Component Value Date   CHOL 117 05/27/2021   HDL 45 05/27/2021   LDLCALC 53 05/27/2021   TRIG 102 05/27/2021   CHOLHDL 2.6 05/27/2021   INR 1.06 12/15/2018   BNP 56.9 11/18/2021   K 4.5 04/01/2022   MG 2.1 11/18/2021   BUN 24 (H) 04/01/2022   BUN 18 05/27/2021   CREATININE 0.89 04/01/2022    Past medical and surgical history were reviewed and updated in EPIC.  Current Meds  Medication Sig   amoxicillin (AMOXIL) 500 MG capsule Take 2,000 mg by mouth See admin instructions. Take 2,000 mg by mouth one hour prior to dental treatment or as otherwise instructed   apixaban (ELIQUIS) 5 MG TABS tablet Take 1 tablet (5 mg  total) by mouth 2 (two) times daily.   bisacodyl (DULCOLAX) 5 MG EC tablet Take 5 mg by mouth daily as needed for mild constipation or moderate constipation.   diphenhydrAMINE HCl (BENADRYL  PO) Take by mouth at bedtime as needed.   nitroGLYCERIN (NITROSTAT) 0.4 MG SL tablet Place 1 tablet (0.4 mg total) under the tongue every 5 (five) minutes as needed for chest pain.   REPATHA SURECLICK 440 MG/ML SOAJ INJECT 1 PEN UNDER THE SKIN EVERY 14 DAYS    Allergies: Other, Atorvastatin, and Grass pollen(k-o-r-t-swt vern)  Social History   Tobacco Use   Smoking status: Former    Packs/day: 1.00    Years: 2.00    Total pack years: 2.00    Types: Cigarettes    Quit date: 08/03/1969    Years since quitting: 53.1   Smokeless tobacco: Never  Vaping Use   Vaping Use: Never used  Substance Use Topics   Alcohol use: Yes    Alcohol/week: 4.0 standard drinks of alcohol    Types: 4 Standard drinks or equivalent per week    Comment: 1 drink every other day   Drug use: No    Family History  Problem Relation Age of Onset   Multiple myeloma Mother    Cancer - Other Mother 32       BREAST   Other Mother        sepsis   Dementia Father    Cancer Father        neck    Cancer - Other Maternal Grandmother    Diabetes Maternal Grandmother    Heart attack Maternal Grandfather 53   Dementia Paternal Grandfather    Healthy Son    Healthy Son    Healthy Daughter    Heart attack Cousin    Cancer Paternal Grandmother     Review of Systems: A 12-system review of systems was performed and was negative except as noted in the HPI.  --------------------------------------------------------------------------------------------------  Physical Exam: BP (!) 136/92 (BP Location: Left Arm, Patient Position: Sitting, Cuff Size: Normal)   Pulse 76   Ht _0  (1.778 m)   Wt 164 lb (74.4 kg)   SpO2 98%   BMI 23.53 kg/m  Repeat BP: 128/86  General:  NAD. Neck: No JVD or HJR. Lungs: Clear to auscultation bilaterally without wheezes or crackles. Heart: Regular rate and rhythm without murmurs, rubs, or gallops. Abdomen: Soft, nontender, nondistended. Extremities: No lower extremity  edema.  EKG: Normal sinus rhythm without abnormalities.  Outside labs (09/05/2022): CMP: Sodium 136, potassium 4.6, chloride 106, CO2 24, BUN 19, creatinine 1.0, glucose 279, calcium 8.6, AST 18, ALT 21, alkaline phosphatase 79, total bilirubin 1.7, total protein 6.3, albumin 3.7  Lipid panel: Total cholesterol 111, triglycerides 70, HDL 44, LDL 51  Hemoglobin A1c 9.2%  --------------------------------------------------------------------------------------------------  ASSESSMENT AND PLAN: Coronary artery disease: John Mcmahon has not had any chest pain but endorses some vague fatigue over the last couple of months.  This is nonspecific but could reflect progression of his previously noted mild-moderate multivessel CAD.  While his lipids are well controlled, his significantly elevated hemoglobin A1c is a concerning risk factor for progression of CAD.  We have agreed to obtain an echocardiogram.  Based on the results and his symptoms at our follow-up visit, we will need to consider further ischemia evaluation.  Pulmonary embolism and fatigue: I am concerned that fatigue and possibly some degree of exertional dyspnea could be related to sequelae of submassive pulmonary embolism  earlier this year.  While there was no evidence of RV strain by echo at the time of PE in January, I recommend repeating a transthoracic echocardiogram to reassess the RV and estimate PA pressures, if possible.  We will plan to continue indefinite anticoagulation in the setting of unprovoked DVT/PE with apixaban 5 mg twice daily.  Hyperlipidemia associated with type 2 diabetes mellitus: Lipids well controlled with Repatha.  We will plan to continue this indefinitely to prevent progression of CAD.  I encouraged John Mcmahon to speak with Dr. Brigitte Pulse at their upcoming visit regarding management of his diabetes mellitus.  Hypertension: Blood pressure suboptimally controlled today as well as at our prior visit in June.  We have agreed  to add valsartan 80 mg daily, with plans to repeat a BMP in about 2 weeks (BMP normal on labs drawn by Dr. Brigitte Pulse 2 days ago).  Nelva Bush, MD 09/07/2022 10:20 AM

## 2022-09-07 ENCOUNTER — Ambulatory Visit: Payer: Medicare Other | Attending: Internal Medicine | Admitting: Internal Medicine

## 2022-09-07 ENCOUNTER — Encounter: Payer: Self-pay | Admitting: Internal Medicine

## 2022-09-07 VITALS — BP 136/92 | HR 76 | Ht 70.0 in | Wt 164.0 lb

## 2022-09-07 DIAGNOSIS — I2510 Atherosclerotic heart disease of native coronary artery without angina pectoris: Secondary | ICD-10-CM | POA: Diagnosis not present

## 2022-09-07 DIAGNOSIS — E1169 Type 2 diabetes mellitus with other specified complication: Secondary | ICD-10-CM

## 2022-09-07 DIAGNOSIS — R5383 Other fatigue: Secondary | ICD-10-CM

## 2022-09-07 DIAGNOSIS — I10 Essential (primary) hypertension: Secondary | ICD-10-CM

## 2022-09-07 DIAGNOSIS — Z86711 Personal history of pulmonary embolism: Secondary | ICD-10-CM | POA: Diagnosis not present

## 2022-09-07 DIAGNOSIS — Z79899 Other long term (current) drug therapy: Secondary | ICD-10-CM

## 2022-09-07 DIAGNOSIS — E785 Hyperlipidemia, unspecified: Secondary | ICD-10-CM

## 2022-09-07 DIAGNOSIS — R0609 Other forms of dyspnea: Secondary | ICD-10-CM | POA: Insufficient documentation

## 2022-09-07 MED ORDER — VALSARTAN 80 MG PO TABS: 80.0000 mg | ORAL_TABLET | Freq: Every day | ORAL | 3 refills | Status: AC

## 2022-09-07 NOTE — Patient Instructions (Signed)
Medication Instructions:  START: Valsartan 80 mg daily  *If you need a refill on your cardiac medications before your next appointment, please call your pharmacy*   Lab Work: Your provider would like for you to return in 2-3 weeks to have the following labs drawn: (BMP).   Please go to the [facility] entrance and check in at the front desk.  You do not need an appointment.  They are open from 7am-6 pm.  You do not need to be fasting.   If you have labs (blood work) drawn today and your tests are completely normal, you will receive your results only by: MyChart Message (if you have MyChart) OR A paper copy in the mail If you have any lab test that is abnormal or we need to change your treatment, we will call you to review the results.   Testing/Procedures: Your physician has requested that you have an echocardiogram in 2-3 weeks. Echocardiography is a painless test that uses sound waves to create images of your heart. It provides your doctor with information about the size and shape of your heart and how well your heart's chambers and valves are working.   You may receive an ultrasound enhancing agent through an IV if needed to better visualize your heart during the echo. This procedure takes approximately one hour.  There are no restrictions for this procedure.  This will take place at [address] [address], [place] [address]    Follow-Up: At [facility], you and your health needs are our priority.  As part of our continuing mission to provide you with exceptional heart care, we have created designated Provider Care Teams.  These Care Teams include your primary Cardiologist (physician) and Advanced Practice Providers (APPs -  Physician Assistants and Nurse Practitioners) who all work together to provide you with the care you need, when you need it.  We recommend signing up for the patient portal called "MyChart".  Sign up information is  provided on this After Visit Summary.  MyChart is used to connect with patients for Virtual Visits (Telemedicine).  Patients are able to view lab/test results, encounter notes, upcoming appointments, etc.  Non-urgent messages can be sent to your provider as well.   To learn more about what you can do with MyChart, go to [email or web address].    Your next appointment:   4-6 week(s)  The format for your next appointment:   In Person  Provider:   You may see Yvonne Kendall, MD or one of the following Advanced Practice Providers on your designated Care Team:   Nicolasa Ducking, NP Eula Listen, PA-C Cadence Fransico Michael, PA-C Charlsie Quest, NP

## 2022-09-12 ENCOUNTER — Other Ambulatory Visit: Payer: Self-pay | Admitting: Internal Medicine

## 2022-09-12 DIAGNOSIS — I712 Thoracic aortic aneurysm, without rupture, unspecified: Secondary | ICD-10-CM | POA: Diagnosis not present

## 2022-09-12 DIAGNOSIS — R634 Abnormal weight loss: Secondary | ICD-10-CM

## 2022-09-12 DIAGNOSIS — I700 Atherosclerosis of aorta: Secondary | ICD-10-CM | POA: Diagnosis not present

## 2022-09-12 DIAGNOSIS — I10 Essential (primary) hypertension: Secondary | ICD-10-CM | POA: Diagnosis not present

## 2022-09-12 DIAGNOSIS — I2510 Atherosclerotic heart disease of native coronary artery without angina pectoris: Secondary | ICD-10-CM | POA: Diagnosis not present

## 2022-09-15 ENCOUNTER — Other Ambulatory Visit (HOSPITAL_BASED_OUTPATIENT_CLINIC_OR_DEPARTMENT_OTHER): Payer: Self-pay | Admitting: Internal Medicine

## 2022-09-15 DIAGNOSIS — R634 Abnormal weight loss: Secondary | ICD-10-CM

## 2022-09-16 ENCOUNTER — Encounter (HOSPITAL_BASED_OUTPATIENT_CLINIC_OR_DEPARTMENT_OTHER): Payer: Self-pay

## 2022-09-16 ENCOUNTER — Ambulatory Visit (HOSPITAL_BASED_OUTPATIENT_CLINIC_OR_DEPARTMENT_OTHER)
Admission: RE | Admit: 2022-09-16 | Discharge: 2022-09-16 | Disposition: A | Discharge status: Home / Self Care | Payer: Medicare Other | Source: Ambulatory Visit | Attending: Internal Medicine | Admitting: Internal Medicine

## 2022-09-16 DIAGNOSIS — R634 Abnormal weight loss: Secondary | ICD-10-CM | POA: Diagnosis not present

## 2022-09-16 LAB — POCT I-STAT CREATININE: Creatinine, Ser: 1 mg/dL (ref 0.61–1.24)

## 2022-09-16 MED ORDER — IOHEXOL 300 MG/ML  SOLN
80.0000 mL | Freq: Once | INTRAMUSCULAR | Status: AC | PRN
  Administered 2022-09-16: 80 mL via INTRAVENOUS

## 2022-09-20 DIAGNOSIS — I10 Essential (primary) hypertension: Secondary | ICD-10-CM | POA: Diagnosis not present

## 2022-09-20 DIAGNOSIS — E139 Other specified diabetes mellitus without complications: Secondary | ICD-10-CM | POA: Diagnosis not present

## 2022-09-20 DIAGNOSIS — E119 Type 2 diabetes mellitus without complications: Secondary | ICD-10-CM | POA: Diagnosis not present

## 2022-09-20 DIAGNOSIS — I2510 Atherosclerotic heart disease of native coronary artery without angina pectoris: Secondary | ICD-10-CM | POA: Diagnosis not present

## 2022-09-26 ENCOUNTER — Ambulatory Visit (HOSPITAL_COMMUNITY): Payer: Medicare Other | Attending: Internal Medicine

## 2022-09-26 DIAGNOSIS — R5383 Other fatigue: Secondary | ICD-10-CM | POA: Diagnosis not present

## 2022-09-26 DIAGNOSIS — R0609 Other forms of dyspnea: Secondary | ICD-10-CM

## 2022-09-26 LAB — ECHOCARDIOGRAM COMPLETE
Area-P 1/2: 3.25 cm2
P 1/2 time: 562 msec
S' Lateral: 2.3 cm

## 2022-09-28 ENCOUNTER — Other Ambulatory Visit: Payer: Self-pay | Admitting: Internal Medicine

## 2022-09-30 DIAGNOSIS — E119 Type 2 diabetes mellitus without complications: Secondary | ICD-10-CM | POA: Diagnosis not present

## 2022-10-02 DIAGNOSIS — Z1211 Encounter for screening for malignant neoplasm of colon: Secondary | ICD-10-CM | POA: Diagnosis not present

## 2022-10-03 DIAGNOSIS — E785 Hyperlipidemia, unspecified: Secondary | ICD-10-CM | POA: Diagnosis not present

## 2022-10-05 ENCOUNTER — Other Ambulatory Visit
Admission: RE | Admit: 2022-10-05 | Discharge: 2022-10-05 | Disposition: A | Discharge status: Home / Self Care | Payer: Medicare Other | Attending: Internal Medicine | Admitting: Internal Medicine

## 2022-10-05 ENCOUNTER — Ambulatory Visit: Payer: Medicare Other | Attending: Internal Medicine | Admitting: Internal Medicine

## 2022-10-05 ENCOUNTER — Encounter: Payer: Self-pay | Admitting: Internal Medicine

## 2022-10-05 VITALS — BP 128/80 | HR 71 | Ht 70.0 in | Wt 162.8 lb

## 2022-10-05 DIAGNOSIS — E785 Hyperlipidemia, unspecified: Secondary | ICD-10-CM

## 2022-10-05 DIAGNOSIS — Z01812 Encounter for preprocedural laboratory examination: Secondary | ICD-10-CM | POA: Diagnosis not present

## 2022-10-05 DIAGNOSIS — I10 Essential (primary) hypertension: Secondary | ICD-10-CM | POA: Diagnosis not present

## 2022-10-05 DIAGNOSIS — I25119 Atherosclerotic heart disease of native coronary artery with unspecified angina pectoris: Secondary | ICD-10-CM

## 2022-10-05 DIAGNOSIS — I2510 Atherosclerotic heart disease of native coronary artery without angina pectoris: Secondary | ICD-10-CM | POA: Insufficient documentation

## 2022-10-05 DIAGNOSIS — Z86711 Personal history of pulmonary embolism: Secondary | ICD-10-CM | POA: Diagnosis not present

## 2022-10-05 DIAGNOSIS — E1169 Type 2 diabetes mellitus with other specified complication: Secondary | ICD-10-CM

## 2022-10-05 LAB — CBC
HCT: 43.9 % (ref 39.0–52.0)
Hemoglobin: 14.5 g/dL (ref 13.0–17.0)
MCH: 30.3 pg (ref 26.0–34.0)
MCHC: 33 g/dL (ref 30.0–36.0)
MCV: 91.6 fL (ref 80.0–100.0)
Platelets: 184 10*3/uL (ref 150–400)
RBC: 4.79 MIL/uL (ref 4.22–5.81)
RDW: 12.3 % (ref 11.5–15.5)
WBC: 5.4 10*3/uL (ref 4.0–10.5)
nRBC: 0 % (ref 0.0–0.2)

## 2022-10-05 NOTE — Patient Instructions (Addendum)
Medication Instructions:  Your Physician recommend you continue on your current medication as directed.    *If you need a refill on your cardiac medications before your next appointment, please call your pharmacy*   Lab Work: Your provider would like for you to have following labs drawn today: (CBC).   Please go to the [facility] entrance and check in at the front desk.  You do not need an appointment.  They are open from 7am-6 pm.   If you have labs (blood work) drawn today and your tests are completely normal, you will receive your results only by: MyChart Message (if you have MyChart) OR A paper copy in the mail If you have any lab test that is abnormal or we need to change your treatment, we will call you to review the results.   Testing/Procedures: Your physician has requested that you have a cardiac catheterization. Cardiac catheterization is used to diagnose and/or treat various heart conditions. Doctors may recommend this procedure for a number of different reasons. The most common reason is to evaluate chest pain. Chest pain can be a symptom of coronary artery disease (CAD), and cardiac catheterization can show whether plaque is narrowing or blocking your heart's arteries. This procedure is also used to evaluate the valves, as well as measure the blood flow and oxygen levels in different parts of your heart. For further information please visit [email or web address] Please follow instruction sheet, as given. [facility]   Follow-Up: At [facility], you and your health needs are our priority.  As part of our continuing mission to provide you with exceptional heart care, we have created designated Provider Care Teams.  These Care Teams include your primary Cardiologist (physician) and Advanced Practice Providers (APPs -  Physician Assistants and Nurse Practitioners) who all work together to provide you with the care you need, when you need it.  We recommend  signing up for the patient portal called "MyChart".  Sign up information is provided on this After Visit Summary.  MyChart is used to connect with patients for Virtual Visits (Telemedicine).  Patients are able to view lab/test results, encounter notes, upcoming appointments, etc.  Non-urgent messages can be sent to your provider as well.   To learn more about what you can do with MyChart, go to [email or web address].    Your next appointment:   1 month(s)  The format for your next appointment:   In Person  Provider:   Yvonne Kendall, MD     [place] A DEPT OF [place]. [facility] John Mcmahon A DEPT OF [place]. [facility] [address], [address] [place] [address] Dept: [phone] Loc: [phone]  John Mcmahon  10/05/2022  You are scheduled for a Cardiac Catheterization on Monday, December 18 with Dr. Cristal Deer Mcmahon.  1. Please arrive at the [facility] (Main Entrance A) at [facility]: [address] [place], [place] [address] at 9:30 AM (This time is two hours before your procedure to ensure your preparation). Free valet parking service is available.   Special note: Every effort is made to have your procedure done on time. Please understand that emergencies sometimes delay scheduled procedures.  2. Diet: Do not eat solid foods after midnight.  The patient may have clear liquids until 5am upon the day of the procedure.  3. Labs: You will need to have blood drawn today (CBC)  4. Medication instructions in preparation for your procedure:  Contrast Allergy: No  Hold Eliquis two days prior to procedure  Start Asprin 81 mg two days prior and morning of procedure Hold Metformin morning of procedure and 48 hours after Hold Insulin morning of procedure  On the morning of your procedure, take your Aspirin 81 mg and any morning medicines NOT listed above.  You may use sips of water.  5. Plan for one  night stay--bring personal belongings. 6. Bring a current list of your medications and current insurance cards. 7. You MUST have a responsible person to drive you home. 8. Someone MUST be with you the first 24 hours after you arrive home or your discharge will be delayed. 9. Please wear clothes that are easy to get on and off and wear slip-on shoes.  Thank you for allowing [place] to care for you!   -- [facility]

## 2022-10-05 NOTE — H&P (View-Only) (Signed)
 Follow-up Outpatient Visit Date: 10/05/2022  Primary Care Provider: Shaw, William D Jr., MD [address] [place] [address]  Chief Complaint: Follow-up coronary artery disease and pulmonary embolism  HPI:  John Mcmahon is a 76 y.o. male with history of coronary artery disease with moderate multivessel CAD, unprovoked submassive pulmonary embolism (10/2021), thoracic aortic aneurysm with mildly dilated aortic arch (followed by Dr. Shaw) hyperlipidemia, and type 2 diabetes mellitus, who presents for follow-up of coronary artery disease and pulmonary embolism.  I last saw John Mcmahon in early November, at which time he reported feeling fairly well though he was still dealing with quite a bit of fatigue.  We agreed to obtain an echocardiogram to assess for developing pulmonary hypertension/RV failure in the setting of his submassive PE earlier in the year.  RVSP could not be estimated.  However, the RV appears normal in size and function.  LVEF was also normal without significant valvular abnormalities.  We also added low-dose valsartan due to elevated blood pressure at our last visit.  Today, John Mcmahon reports that he is feeling fairly well.  However, he felt very tired and weak after starting valsartan, metformin, and insulin.  Those symptoms have resolved over the last 4 to 5 days and he feels close to his baseline again.  He also recounts to concerning episodes in which she developed shortness of breath and chest tightness with activity.  The first occurred when he walked up a short embankment while fishing.  The second occurred while blowing leaves at his home.  He has typically been able to do these activities without any symptoms.  It took about 15 minutes for the vague discomfort across his chest to resolved.  He has not had any symptoms at rest.  He denies palpitations and edema.  He was a little lightheaded when he first started valsartan, though this has since resolved.  Labs two days ago  through Dr. Shaw's office showed normal renal function and electrolytes.  --------------------------------------------------------------------------------------------------  Cardiovascular History & Procedures: Cardiovascular Problems: Unprovoked submassive pulmonary embolism (10/2021) Mild to moderate coronary artery disease Thoracic aortic aneurysm (mild dilation of aortic arch)   Risk Factors: Known coronary artery disease, hyperlipidemia, male gender, age greater than 55   Cath/PCI: LHC (08/07/17): LMCA normal. LAD with 50% midvessel stenosis. Codominant LCx with 40% lPDA lesion. 50% proximal and 25% mid RCA lesions. LVEF 55-65%. LVEDP 10 mmHg.   CV Surgery: None   EP Procedures and Devices: None   Non-Invasive Evaluation(s): TTE (09/26/2022): Normal LV size and wall thickness.  LVEF 60-65% with grade 1 diastolic function.  Normal RV size and function.  PA pressure unable to be estimated.  Normal biatrial size.  Trivial aortic regurgitation.  Trivial mitral regurgitation.  Normal CVP.  Mildly dilated ascending aorta, measuring 4.0 cm. TTE (11/19/2021): Normal LV size and wall thickness.  LVEF 60-65% with indeterminate diastolic function.  Normal wall motion.  Normal RV size and function.  Unable to assess PA pressure.  Normal biatrial size.  No significant valvular abnormality. Lower extremity venous duplex (11/18/2021): Findings consistent with age-indeterminate DVT involving the right femoral and peroneal veins. Exercise MPI (06/03/2021): Low risk study without ischemia or scar.  Good exercise capacity, achieving 10.9 METS and 95% MPHR. Coronary calcium score (06/29/17): Calcium score of 952 with extensive calcifications in the LAD him a as well as less prominent calcification involving the LMCA, LCx, and RCA. Slight aneurysmal dilation of ascending aortic, measuring 4.0 cm. 18 mm left lower lobe   nodule.  Recent CV Pertinent Labs: Lab Results  Component Value Date   CHOL 117 05/27/2021    HDL 45 05/27/2021   LDLCALC 53 05/27/2021   TRIG 102 05/27/2021   CHOLHDL 2.6 05/27/2021   INR 1.06 12/15/2018   BNP 56.9 11/18/2021   K 4.5 04/01/2022   MG 2.1 11/18/2021   BUN 24 (H) 04/01/2022   BUN 18 05/27/2021   CREATININE 1.00 09/16/2022   CREATININE 0.89 04/01/2022    Past medical and surgical history were reviewed and updated in EPIC.  Current Meds  Medication Sig   metFORMIN (GLUCOPHAGE) 500 MG tablet Take 500 mg by mouth 2 (two) times daily with a meal.   TRESIBA FLEXTOUCH 100 UNIT/ML FlexTouch Pen Inject into the skin.    Allergies: Other, Atorvastatin, and Grass pollen(k-o-r-t-swt vern)  Social History   Tobacco Use   Smoking status: Former    Packs/day: 1.00    Years: 2.00    Total pack years: 2.00    Types: Cigarettes    Quit date: 08/03/1969    Years since quitting: 53.2   Smokeless tobacco: Never  Vaping Use   Vaping Use: Never used  Substance Use Topics   Alcohol use: Yes    Alcohol/week: 4.0 standard drinks of alcohol    Types: 4 Standard drinks or equivalent per week    Comment: 1 drink every other day   Drug use: No    Family History  Problem Relation Age of Onset   Multiple myeloma Mother    Cancer - Other Mother 40       BREAST   Other Mother        sepsis   Dementia Father    Cancer Father        neck    Cancer - Other Maternal Grandmother    Diabetes Maternal Grandmother    Heart attack Maternal Grandfather 65   Dementia Paternal Grandfather    Healthy Son    Healthy Son    Healthy Daughter    Heart attack Cousin    Cancer Paternal Grandmother     Review of Systems: A 12-system review of systems was performed and was negative except as noted in the HPI.  --------------------------------------------------------------------------------------------------  Physical Exam: BP 128/80 (BP Location: Left Arm, Patient Position: Sitting, Cuff Size: Normal)   Pulse 71   Ht 5' 10" (1.778 m)   Wt 162 lb 12.8 oz (73.8 kg)   SpO2  98%   BMI 23.36 kg/m   General:  NAD. Neck: No JVD or HJR. Lungs: Clear to auscultation bilaterally without wheezes or crackles. Heart: Regular rate and rhythm without murmurs, rubs, or gallops. Abdomen: Soft, nontender, nondistended. Extremities: No lower extremity edema.  Lab Results  Component Value Date   WBC 6.2 04/01/2022   HGB 15.5 04/01/2022   HCT 44.9 04/01/2022   MCV 90.0 04/01/2022   PLT 174 04/01/2022    Lab Results  Component Value Date   NA 140 04/01/2022   K 4.5 04/01/2022   CL 110 04/01/2022   CO2 24 04/01/2022   BUN 24 (H) 04/01/2022   CREATININE 1.00 09/16/2022   GLUCOSE 105 (H) 04/01/2022   ALT 21 04/01/2022    Lab Results  Component Value Date   CHOL 117 05/27/2021   HDL 45 05/27/2021   LDLCALC 53 05/27/2021   TRIG 102 05/27/2021   CHOLHDL 2.6 05/27/2021   Outside labs (12//2023): Sodium 138, potassium 4.9, chloride 104, CO2 29, BUN 18, creatinine 0.9,   glucose 140, calcium 8.7  --------------------------------------------------------------------------------------------------  ASSESSMENT AND PLAN: Coronary artery disease with accelerating angina: John Mcmahon has been doing well without significant chest pain up until recently.  He has continued to have some fatigue and dyspnea dating back to his PE in January.  However, he now also recounts 2 episodes of vague chest discomfort with modest activity over the last 6 weeks.  He is feeling well today.  We have discussed further evaluation options including stress testing, coronary CTA, and cardiac catheterization and have agreed to move forward with right and left heart catheterization.  He will hold apixaban for 2 days before the catheterization and begin taking aspirin 81 mg daily during that time.  We will defer other medication changes today.  Continue secondary prevention with Repatha.  History of pulmonary embolism: Recent echocardiogram did not show any evidence of RV dysfunction though PA  pressures could not be estimated.  Given aforementioned symptoms with plans for cardiac catheterization, we will also perform RHC to better understand his hemodynamics.  Continue indefinite anticoagulation with transient interruption for upcoming catheterization.  Hypertension: Blood pressure improved today and very well-controlled at home.  Continue current dose of valsartan.  Hyperlipidemia associated with type 2 diabetes mellitus: Lipids well-controlled.  Continue Repatha and ongoing treatment of DM per Dr. Shaw.  Shared Decision Making/Informed Consent The risks [stroke (1 in 1000), death (1 in 1000), kidney failure [usually temporary] (1 in 500), bleeding (1 in 200), allergic reaction [possibly serious] (1 in 200)], benefits (diagnostic support and management of coronary artery disease) and alternatives of a cardiac catheterization were discussed in detail with John Mcmahon and he is willing to proceed.  Follow-up: Return to clinic in 1 month.  Debra Calabretta, MD 10/05/2022 9:08 AM  

## 2022-10-05 NOTE — Progress Notes (Signed)
Follow-up Outpatient Visit Date: 10/05/2022  Primary Care Provider: Ginger Organ., MD [place] [address]  Chief Complaint: Follow-up coronary artery disease and pulmonary embolism  HPI:  John Mcmahon is a 76 y.o. male with history of coronary artery disease with moderate multivessel CAD, unprovoked submassive pulmonary embolism (10/2021), thoracic aortic aneurysm with mildly dilated aortic arch (followed by Dr. Brigitte Pulse) hyperlipidemia, and type 2 diabetes mellitus, who presents for follow-up of coronary artery disease and pulmonary embolism.  I last saw Mr. Sharpley in early November, at which time he reported feeling fairly well though he was still dealing with quite a bit of fatigue.  We agreed to obtain an echocardiogram to assess for developing pulmonary hypertension/RV failure in the setting of his submassive PE earlier in the year.  RVSP could not be estimated.  However, the RV appears normal in size and function.  LVEF was also normal without significant valvular abnormalities.  We also added low-dose valsartan due to elevated blood pressure at our last visit.  Today, Mr. Tones reports that he is feeling fairly well.  However, he felt very tired and weak after starting valsartan, metformin, and insulin.  Those symptoms have resolved over the last 4 to 5 days and he feels close to his baseline again.  He also recounts to concerning episodes in which she developed shortness of breath and chest tightness with activity.  The first occurred when he walked up a short embankment while fishing.  The second occurred while blowing leaves at his home.  He has typically been able to do these activities without any symptoms.  It took about 15 minutes for the vague discomfort across his chest to resolved.  He has not had any symptoms at rest.  He denies palpitations and edema.  He was a little lightheaded when he first started valsartan, though this has since resolved.  Labs two days ago  through Dr. Raul Del office showed normal renal function and electrolytes.  --------------------------------------------------------------------------------------------------  Cardiovascular History & Procedures: Cardiovascular Problems: Unprovoked submassive pulmonary embolism (10/2021) Mild to moderate coronary artery disease Thoracic aortic aneurysm (mild dilation of aortic arch)   Risk Factors: Known coronary artery disease, hyperlipidemia, male gender, age greater than 69   Cath/PCI: LHC (08/07/17): LMCA normal. LAD with 50% midvessel stenosis. Codominant LCx with 40% lPDA lesion. 50% proximal and 25% mid RCA lesions. LVEF 55-65%. LVEDP 10 mmHg.   CV Surgery: None   EP Procedures and Devices: None   Non-Invasive Evaluation(s): TTE (09/26/2022): Normal LV size and wall thickness.  LVEF 60-65% with grade 1 diastolic function.  Normal RV size and function.  PA pressure unable to be estimated.  Normal biatrial size.  Trivial aortic regurgitation.  Trivial mitral regurgitation.  Normal CVP.  Mildly dilated ascending aorta, measuring 4.0 cm. TTE (11/19/2021): Normal LV size and wall thickness.  LVEF 60-65% with indeterminate diastolic function.  Normal wall motion.  Normal RV size and function.  Unable to assess PA pressure.  Normal biatrial size.  No significant valvular abnormality. Lower extremity venous duplex (11/18/2021): Findings consistent with age-indeterminate DVT involving the right femoral and peroneal veins. Exercise MPI (06/03/2021): Low risk study without ischemia or scar.  Good exercise capacity, achieving 10.9 METS and 95% MPHR. Coronary calcium score (06/29/17): Calcium score of 952 with extensive calcifications in the LAD him a as well as less prominent calcification involving the LMCA, LCx, and RCA. Slight aneurysmal dilation of ascending aortic, measuring 4.0 cm. 18 mm left lower lobe  nodule.  Recent CV Pertinent Labs: Lab Results  Component Value Date   CHOL 117 05/27/2021    HDL 45 05/27/2021   LDLCALC 53 05/27/2021   TRIG 102 05/27/2021   CHOLHDL 2.6 05/27/2021   INR 1.06 12/15/2018   BNP 56.9 11/18/2021   K 4.5 04/01/2022   MG 2.1 11/18/2021   BUN 24 (H) 04/01/2022   BUN 18 05/27/2021   CREATININE 1.00 09/16/2022   CREATININE 0.89 04/01/2022    Past medical and surgical history were reviewed and updated in EPIC.  Current Meds  Medication Sig   metFORMIN (GLUCOPHAGE) 500 MG tablet Take 500 mg by mouth 2 (two) times daily with a meal.   TRESIBA FLEXTOUCH 100 UNIT/ML FlexTouch Pen Inject into the skin.    Allergies: Other, Atorvastatin, and Grass pollen([id number] vern)  Social History   Tobacco Use   Smoking status: Former    Packs/day: 1.00    Years: 2.00    Total pack years: 2.00    Types: Cigarettes    Quit date: 08/03/1969    Years since quitting: 53.2   Smokeless tobacco: Never  Vaping Use   Vaping Use: Never used  Substance Use Topics   Alcohol use: Yes    Alcohol/week: 4.0 standard drinks of alcohol    Types: 4 Standard drinks or equivalent per week    Comment: 1 drink every other day   Drug use: No    Family History  Problem Relation Age of Onset   Multiple myeloma Mother    Cancer - Other Mother 23       BREAST   Other Mother        sepsis   Dementia Father    Cancer Father        neck    Cancer - Other Maternal Grandmother    Diabetes Maternal Grandmother    Heart attack Maternal Grandfather 79   Dementia Paternal Grandfather    Healthy Son    Healthy Son    Healthy Daughter    Heart attack Cousin    Cancer Paternal Grandmother     Review of Systems: A 12-system review of systems was performed and was negative except as noted in the HPI.  --------------------------------------------------------------------------------------------------  Physical Exam: BP 128/80 (BP Location: Left Arm, Patient Position: Sitting, Cuff Size: Normal)   Pulse 71   Ht _0  (1.778 m)   Wt 162 lb 12.8 oz (73.8 kg)   SpO2  98%   BMI 23.36 kg/m   General:  NAD. Neck: No JVD or HJR. Lungs: Clear to auscultation bilaterally without wheezes or crackles. Heart: Regular rate and rhythm without murmurs, rubs, or gallops. Abdomen: Soft, nontender, nondistended. Extremities: No lower extremity edema.  Lab Results  Component Value Date   WBC 6.2 04/01/2022   HGB 15.5 04/01/2022   HCT 44.9 04/01/2022   MCV 90.0 04/01/2022   PLT 174 04/01/2022    Lab Results  Component Value Date   NA 140 04/01/2022   K 4.5 04/01/2022   CL 110 04/01/2022   CO2 24 04/01/2022   BUN 24 (H) 04/01/2022   CREATININE 1.00 09/16/2022   GLUCOSE 105 (H) 04/01/2022   ALT 21 04/01/2022    Lab Results  Component Value Date   CHOL 117 05/27/2021   HDL 45 05/27/2021   LDLCALC 53 05/27/2021   TRIG 102 05/27/2021   CHOLHDL 2.6 05/27/2021   Outside labs (12//2023): Sodium 138, potassium 4.9, chloride 104, CO2 29, BUN 18, creatinine 0.9,  glucose 140, calcium 8.7  --------------------------------------------------------------------------------------------------  ASSESSMENT AND PLAN: Coronary artery disease with accelerating angina: Mr. Goynes has been doing well without significant chest pain up until recently.  He has continued to have some fatigue and dyspnea dating back to his PE in January.  However, he now also recounts 2 episodes of vague chest discomfort with modest activity over the last 6 weeks.  He is feeling well today.  We have discussed further evaluation options including stress testing, coronary CTA, and cardiac catheterization and have agreed to move forward with right and left heart catheterization.  He will hold apixaban for 2 days before the catheterization and begin taking aspirin 81 mg daily during that time.  We will defer other medication changes today.  Continue secondary prevention with Repatha.  History of pulmonary embolism: Recent echocardiogram did not show any evidence of RV dysfunction though PA  pressures could not be estimated.  Given aforementioned symptoms with plans for cardiac catheterization, we will also perform RHC to better understand his hemodynamics.  Continue indefinite anticoagulation with transient interruption for upcoming catheterization.  Hypertension: Blood pressure improved today and very well-controlled at home.  Continue current dose of valsartan.  Hyperlipidemia associated with type 2 diabetes mellitus: Lipids well-controlled.  Continue Repatha and ongoing treatment of DM per Dr. Brigitte Pulse.  Shared Decision Making/Informed Consent The risks [stroke (1 in 1000), death (1 in 1000), kidney failure [usually temporary] (1 in 500), bleeding (1 in 200), allergic reaction [possibly serious] (1 in 200)], benefits (diagnostic support and management of coronary artery disease) and alternatives of a cardiac catheterization were discussed in detail with Mr. Vandevoort and he is willing to proceed.  Follow-up: Return to clinic in 1 month.  Nelva Bush, MD 10/05/2022 9:08 AM

## 2022-10-10 ENCOUNTER — Other Ambulatory Visit: Payer: Medicare Other

## 2022-10-11 ENCOUNTER — Encounter: Payer: Self-pay | Admitting: Internal Medicine

## 2022-10-13 ENCOUNTER — Telehealth: Payer: Self-pay | Admitting: *Deleted

## 2022-10-13 ENCOUNTER — Other Ambulatory Visit: Payer: Medicare Other

## 2022-10-13 NOTE — Telephone Encounter (Signed)
Cardiac Catheterization scheduled at [facility] for: Monday October 17, 2022 10:30 AM Arrival time and place: [facility] Main Entrance A at: 8:30 AM  Nothing to eat after midnight prior to procedure, clear liquids until 5 AM day of procedure.  Medication instructions: -Hold:  Eliquis-none 10/15/22 until post procedure  Tresiba-AM of procedure  Metformin-none day of procedure and 48 hours post procedure -Except hold medications usual morning medications can be taken with sips of water including aspirin 81 mg.  Confirmed patient has responsible adult to drive home post procedure and be with patient first 24 hours after arriving home.  Patient reports no new symptoms concerning for COVID-19 in the past 10 days.  Reviewed procedure instructions with patient.

## 2022-10-17 ENCOUNTER — Encounter (HOSPITAL_COMMUNITY)
Admission: RE | Disposition: A | Discharge status: Home / Self Care | Payer: Self-pay | Source: Home / Self Care | Attending: Internal Medicine

## 2022-10-17 ENCOUNTER — Other Ambulatory Visit: Payer: Self-pay

## 2022-10-17 ENCOUNTER — Ambulatory Visit (HOSPITAL_COMMUNITY)
Admission: RE | Admit: 2022-10-17 | Discharge: 2022-10-17 | Disposition: A | Discharge status: Home / Self Care | Payer: Medicare Other | Attending: Internal Medicine | Admitting: Internal Medicine

## 2022-10-17 DIAGNOSIS — Z87891 Personal history of nicotine dependence: Secondary | ICD-10-CM | POA: Insufficient documentation

## 2022-10-17 DIAGNOSIS — I25119 Atherosclerotic heart disease of native coronary artery with unspecified angina pectoris: Secondary | ICD-10-CM

## 2022-10-17 DIAGNOSIS — E1169 Type 2 diabetes mellitus with other specified complication: Secondary | ICD-10-CM | POA: Insufficient documentation

## 2022-10-17 DIAGNOSIS — I10 Essential (primary) hypertension: Secondary | ICD-10-CM | POA: Insufficient documentation

## 2022-10-17 DIAGNOSIS — Z7982 Long term (current) use of aspirin: Secondary | ICD-10-CM | POA: Diagnosis not present

## 2022-10-17 DIAGNOSIS — Z833 Family history of diabetes mellitus: Secondary | ICD-10-CM | POA: Diagnosis not present

## 2022-10-17 DIAGNOSIS — Z86711 Personal history of pulmonary embolism: Secondary | ICD-10-CM | POA: Insufficient documentation

## 2022-10-17 DIAGNOSIS — I712 Thoracic aortic aneurysm, without rupture, unspecified: Secondary | ICD-10-CM | POA: Diagnosis not present

## 2022-10-17 DIAGNOSIS — Z7984 Long term (current) use of oral hypoglycemic drugs: Secondary | ICD-10-CM | POA: Insufficient documentation

## 2022-10-17 DIAGNOSIS — E785 Hyperlipidemia, unspecified: Secondary | ICD-10-CM | POA: Insufficient documentation

## 2022-10-17 DIAGNOSIS — I2510 Atherosclerotic heart disease of native coronary artery without angina pectoris: Secondary | ICD-10-CM | POA: Diagnosis present

## 2022-10-17 DIAGNOSIS — Z8249 Family history of ischemic heart disease and other diseases of the circulatory system: Secondary | ICD-10-CM | POA: Diagnosis not present

## 2022-10-17 HISTORY — PX: CORONARY PRESSURE/FFR STUDY: CATH118243

## 2022-10-17 HISTORY — PX: RIGHT HEART CATH AND CORONARY ANGIOGRAPHY: CATH118264

## 2022-10-17 LAB — POCT I-STAT EG7
Acid-base deficit: 3 mmol/L — ABNORMAL HIGH (ref 0.0–2.0)
Bicarbonate: 22.4 mmol/L (ref 20.0–28.0)
Calcium, Ion: 1.22 mmol/L (ref 1.15–1.40)
HCT: 39 % (ref 39.0–52.0)
Hemoglobin: 13.3 g/dL (ref 13.0–17.0)
O2 Saturation: 69 %
Potassium: 4.3 mmol/L (ref 3.5–5.1)
Sodium: 142 mmol/L (ref 135–145)
TCO2: 24 mmol/L (ref 22–32)
pCO2, Ven: 41.3 mmHg — ABNORMAL LOW (ref 44–60)
pH, Ven: 7.343 (ref 7.25–7.43)
pO2, Ven: 38 mmHg (ref 32–45)

## 2022-10-17 LAB — POCT I-STAT 7, (LYTES, BLD GAS, ICA,H+H)
Acid-base deficit: 4 mmol/L — ABNORMAL HIGH (ref 0.0–2.0)
Bicarbonate: 20.6 mmol/L (ref 20.0–28.0)
Calcium, Ion: 1.22 mmol/L (ref 1.15–1.40)
HCT: 38 % — ABNORMAL LOW (ref 39.0–52.0)
Hemoglobin: 12.9 g/dL — ABNORMAL LOW (ref 13.0–17.0)
O2 Saturation: 95 %
Potassium: 4.4 mmol/L (ref 3.5–5.1)
Sodium: 141 mmol/L (ref 135–145)
TCO2: 22 mmol/L (ref 22–32)
pCO2 arterial: 35.1 mmHg (ref 32–48)
pH, Arterial: 7.376 (ref 7.35–7.45)
pO2, Arterial: 79 mmHg — ABNORMAL LOW (ref 83–108)

## 2022-10-17 LAB — GLUCOSE, CAPILLARY: Glucose-Capillary: 100 mg/dL — ABNORMAL HIGH (ref 70–99)

## 2022-10-17 LAB — POCT ACTIVATED CLOTTING TIME: Activated Clotting Time: 255 seconds

## 2022-10-17 SURGERY — RIGHT HEART CATH AND CORONARY ANGIOGRAPHY
Anesthesia: LOCAL

## 2022-10-17 MED ORDER — SODIUM CHLORIDE 0.9 % IV SOLN: 250.0000 mL | INTRAVENOUS | Status: DC | PRN

## 2022-10-17 MED ORDER — SODIUM CHLORIDE 0.9% FLUSH: 3.0000 mL | INTRAVENOUS | Status: DC | PRN

## 2022-10-17 MED ORDER — HYDRALAZINE HCL 20 MG/ML IJ SOLN: 10.0000 mg | INTRAMUSCULAR | Status: DC | PRN

## 2022-10-17 MED ORDER — SODIUM CHLORIDE 0.9% FLUSH: 3.0000 mL | Freq: Two times a day (BID) | INTRAVENOUS | Status: DC

## 2022-10-17 MED ORDER — LIDOCAINE HCL (PF) 1 % IJ SOLN
INTRAMUSCULAR | Status: AC
  Filled 2022-10-17: qty 30

## 2022-10-17 MED ORDER — METFORMIN HCL 500 MG PO TABS: 500.0000 mg | ORAL_TABLET | Freq: Two times a day (BID) | ORAL | Status: AC

## 2022-10-17 MED ORDER — SODIUM CHLORIDE 0.9 % IV BOLUS
INTRAVENOUS | Status: DC | PRN
  Administered 2022-10-17: 250 mL via INTRAVENOUS

## 2022-10-17 MED ORDER — MIDAZOLAM HCL 2 MG/2ML IJ SOLN
INTRAMUSCULAR | Status: DC | PRN
  Administered 2022-10-17: 1 mg via INTRAVENOUS

## 2022-10-17 MED ORDER — FENTANYL CITRATE (PF) 100 MCG/2ML IJ SOLN
INTRAMUSCULAR | Status: AC
  Filled 2022-10-17: qty 2

## 2022-10-17 MED ORDER — ONDANSETRON HCL 4 MG/2ML IJ SOLN: 4.0000 mg | Freq: Four times a day (QID) | INTRAMUSCULAR | Status: DC | PRN

## 2022-10-17 MED ORDER — SODIUM CHLORIDE 0.9 % IV SOLN: INTRAVENOUS | Status: DC

## 2022-10-17 MED ORDER — NITROGLYCERIN 1 MG/10 ML FOR IR/CATH LAB
INTRA_ARTERIAL | Status: AC
  Filled 2022-10-17: qty 10

## 2022-10-17 MED ORDER — HEPARIN (PORCINE) IN NACL 1000-0.9 UT/500ML-% IV SOLN
INTRAVENOUS | Status: AC
  Filled 2022-10-17: qty 1000

## 2022-10-17 MED ORDER — ISOSORBIDE MONONITRATE ER 30 MG PO TB24: 15.0000 mg | ORAL_TABLET | Freq: Every day | ORAL | 5 refills | Status: DC

## 2022-10-17 MED ORDER — HEPARIN SODIUM (PORCINE) 1000 UNIT/ML IJ SOLN
INTRAMUSCULAR | Status: DC | PRN
  Administered 2022-10-17 (×2): 4000 [IU] via INTRAVENOUS

## 2022-10-17 MED ORDER — VERAPAMIL HCL 2.5 MG/ML IV SOLN
INTRAVENOUS | Status: DC | PRN
  Administered 2022-10-17: 10 mL via INTRA_ARTERIAL

## 2022-10-17 MED ORDER — ASPIRIN 81 MG PO CHEW: 81.0000 mg | CHEWABLE_TABLET | ORAL | Status: DC

## 2022-10-17 MED ORDER — LABETALOL HCL 5 MG/ML IV SOLN: 10.0000 mg | INTRAVENOUS | Status: DC | PRN

## 2022-10-17 MED ORDER — IOHEXOL 350 MG/ML SOLN
INTRAVENOUS | Status: DC | PRN
  Administered 2022-10-17: 60 mL

## 2022-10-17 MED ORDER — FENTANYL CITRATE (PF) 100 MCG/2ML IJ SOLN
INTRAMUSCULAR | Status: DC | PRN
  Administered 2022-10-17: 25 ug via INTRAVENOUS

## 2022-10-17 MED ORDER — HEPARIN (PORCINE) IN NACL 1000-0.9 UT/500ML-% IV SOLN
INTRAVENOUS | Status: DC | PRN
  Administered 2022-10-17 (×2): 500 mL

## 2022-10-17 MED ORDER — SODIUM CHLORIDE 0.9 % WEIGHT BASED INFUSION: 1.0000 mL/kg/h | INTRAVENOUS | Status: DC

## 2022-10-17 MED ORDER — LIDOCAINE HCL (PF) 1 % IJ SOLN
INTRAMUSCULAR | Status: DC | PRN
  Administered 2022-10-17 (×2): 2 mL via INTRADERMAL

## 2022-10-17 MED ORDER — MIDAZOLAM HCL 2 MG/2ML IJ SOLN
INTRAMUSCULAR | Status: AC
  Filled 2022-10-17: qty 2

## 2022-10-17 MED ORDER — HEPARIN SODIUM (PORCINE) 1000 UNIT/ML IJ SOLN
INTRAMUSCULAR | Status: AC
  Filled 2022-10-17: qty 10

## 2022-10-17 MED ORDER — VERAPAMIL HCL 2.5 MG/ML IV SOLN
INTRAVENOUS | Status: AC
  Filled 2022-10-17: qty 2

## 2022-10-17 MED ORDER — NITROGLYCERIN 1 MG/10 ML FOR IR/CATH LAB
INTRA_ARTERIAL | Status: DC | PRN
  Administered 2022-10-17: 200 ug via INTRACORONARY

## 2022-10-17 MED ORDER — ACETAMINOPHEN 325 MG PO TABS: 650.0000 mg | ORAL_TABLET | ORAL | Status: DC | PRN

## 2022-10-17 MED ORDER — SODIUM CHLORIDE 0.9 % WEIGHT BASED INFUSION
3.0000 mL/kg/h | INTRAVENOUS | Status: DC
  Administered 2022-10-17: 3 mL/kg/h via INTRAVENOUS

## 2022-10-17 MED ORDER — VERAPAMIL HCL 2.5 MG/ML IV SOLN
INTRAVENOUS | Status: DC | PRN
  Administered 2022-10-17: 2 mg via INTRAVENOUS

## 2022-10-17 SURGICAL SUPPLY — 14 items
CATH 5FR JL3.5 JR4 ANG PIG MP (CATHETERS) IMPLANT
CATH LAUNCHER 5F JR4 (CATHETERS) IMPLANT
CATH SWAN GANZ 7F STRAIGHT (CATHETERS) IMPLANT
GLIDESHEATH SLEND SS 6F .021 (SHEATH) IMPLANT
GLIDESHEATH SLENDER 7FR .021G (SHEATH) IMPLANT
GUIDEWIRE .025 260CM (WIRE) IMPLANT
GUIDEWIRE INQWIRE 1.5J.035X260 (WIRE) IMPLANT
GUIDEWIRE PRESSURE X 175 (WIRE) IMPLANT
INQWIRE 1.5J .035X260CM (WIRE) ×1
KIT ESSENTIALS PG (KITS) IMPLANT
KIT HEART LEFT (KITS) ×1 IMPLANT
PACK CARDIAC CATHETERIZATION (CUSTOM PROCEDURE TRAY) ×1 IMPLANT
TRANSDUCER W/STOPCOCK (MISCELLANEOUS) ×1 IMPLANT
TUBING CIL FLEX 10 FLL-RA (TUBING) ×1 IMPLANT

## 2022-10-17 NOTE — Interval H&P Note (Signed)
History and Physical Interval Note:  10/17/2022 10:39 AM  John Mcmahon  has presented today for surgery, with the diagnosis of coronary artery disease with accelerating angina.  The various methods of treatment have been discussed with the patient and family. After consideration of risks, benefits and other options for treatment, the patient has consented to  Procedure(s): RIGHT/LEFT HEART CATH AND CORONARY ANGIOGRAPHY (N/A) as a surgical intervention.  The patient's history has been reviewed, patient examined, no change in status, stable for surgery.  I have reviewed the patient's chart and labs.  Questions were answered to the patient's satisfaction.    Cath Lab Visit (complete for each Cath Lab visit)  Clinical Evaluation Leading to the Procedure:   ACS: No.  Non-ACS:    Anginal Classification: CCS III  Anti-ischemic medical therapy: No Therapy  Non-Invasive Test Results: Low-risk stress test findings: cardiac mortality <1%/year  Prior CABG: No previous CABG  Isao Seltzer

## 2022-10-18 ENCOUNTER — Encounter (HOSPITAL_COMMUNITY): Payer: Self-pay | Admitting: Internal Medicine

## 2022-11-18 ENCOUNTER — Other Ambulatory Visit (HOSPITAL_COMMUNITY): Payer: Self-pay

## 2022-11-18 ENCOUNTER — Telehealth: Payer: Self-pay | Admitting: Internal Medicine

## 2022-11-18 ENCOUNTER — Ambulatory Visit: Payer: Medicare Other | Attending: Internal Medicine | Admitting: Internal Medicine

## 2022-11-18 ENCOUNTER — Encounter: Payer: Self-pay | Admitting: Internal Medicine

## 2022-11-18 VITALS — BP 110/70 | HR 69 | Ht 70.0 in | Wt 165.0 lb

## 2022-11-18 DIAGNOSIS — E1169 Type 2 diabetes mellitus with other specified complication: Secondary | ICD-10-CM

## 2022-11-18 DIAGNOSIS — Z86711 Personal history of pulmonary embolism: Secondary | ICD-10-CM | POA: Diagnosis not present

## 2022-11-18 DIAGNOSIS — I2510 Atherosclerotic heart disease of native coronary artery without angina pectoris: Secondary | ICD-10-CM | POA: Diagnosis not present

## 2022-11-18 DIAGNOSIS — I10 Essential (primary) hypertension: Secondary | ICD-10-CM | POA: Diagnosis not present

## 2022-11-18 DIAGNOSIS — E785 Hyperlipidemia, unspecified: Secondary | ICD-10-CM

## 2022-11-18 NOTE — Telephone Encounter (Signed)
Pt c/o medication issue:  1. Name of Medication: REPATHA SURECLICK 646 MG/ML SOAJ   2. How are you currently taking this medication (dosage and times per day)?   3. Are you having a reaction (difficulty breathing--STAT)?   4. What is your medication issue? Pt states he got a letter stating this medication needs to be authorized through his new [place] healthcare plan.

## 2022-11-18 NOTE — Patient Instructions (Signed)
Medication Instructions:  Your physician recommends the following medication changes.  STOP TAKING: Imdur 15 mg daily  *If you need a refill on your cardiac medications before your next appointment, please call your pharmacy*   Lab Work: None ordered today   Testing/Procedures: None ordered today   Follow-Up: At [facility], you and your health needs are our priority.  As part of our continuing mission to provide you with exceptional heart care, we have created designated Provider Care Teams.  These Care Teams include your primary Cardiologist (physician) and Advanced Practice Providers (APPs -  Physician Assistants and Nurse Practitioners) who all work together to provide you with the care you need, when you need it.  We recommend signing up for the patient portal called "MyChart".  Sign up information is provided on this After Visit Summary.  MyChart is used to connect with patients for Virtual Visits (Telemedicine).  Patients are able to view lab/test results, encounter notes, upcoming appointments, etc.  Non-urgent messages can be sent to your provider as well.   To learn more about what you can do with MyChart, go to [email or web address].    Your next appointment:   6 month(s)  Provider:   You may see Nelva Bush, MD or one of the following Advanced Practice Providers on your designated Care Team:   Murray Hodgkins, NP Christell Faith, PA-C Cadence Kathlen Mody, PA-C Gerrie Nordmann, NP

## 2022-11-18 NOTE — Telephone Encounter (Signed)
PA pending, will update chart with encounter with determination

## 2022-11-18 NOTE — Progress Notes (Signed)
Follow-up Outpatient Visit Date: 11/18/2022  Primary Care Provider: Cleatis Polka., MD [address] [place] [address]  Chief Complaint: Follow-up CAD and pulmonary embolism  HPI:  John Mcmahon is a 77 y.o. male with history of coronary artery disease with moderate multivessel CAD, unprovoked submassive pulmonary embolism (10/2021), thoracic aortic aneurysm with mildly dilated aortic arch (followed by Dr. Clelia Croft) hyperlipidemia, and type 2 diabetes mellitus, who presents for follow-up of coronary artery disease and pulmonary embolism.  I last saw him in early December, at which time he noted few episodes of fatigue and chest tightness with modest activity.  We agreed to proceed with cardiac catheterization performed in mid December.  He was noted to have mild-moderate, nonobstructive coronary artery disease.  Most significant stenosis was a 60% lesion in the proximal RCA that was not hemodynamically significant by RFR.  Right and left heart filling pressures were not normal.  Fick cardiac output was normal.  Thermodilution cardiac output was mildly reduced.  We agreed to restart isosorbide mononitrate for antianginal therapy.  Today, John Mcmahon reports that he is feeling fairly well.  He wanted up never starting the isosorbide mononitrate because he was concerned about headaches that had been a problem for him in the past.  He is walking 2-2.5 miles a day without any difficulty.  He notes only 1 episode when he quickly crossed a busy street and felt "strange."  He had to go down on 1 knee for about 5 seconds but then felt back to normal.  He denies frank chest pain or shortness of breath with this episode.  Last month, he also had some heaviness and aching in his legs and shoulders when he would go upstairs quickly, though this has abated.  He denies palpitations, lightheadedness, and edema as well as bleeding.  His right arm catheterization sites healed  well.  --------------------------------------------------------------------------------------------------  Cardiovascular History & Procedures: Cardiovascular Problems: Unprovoked submassive pulmonary embolism (10/2021) Mild to moderate coronary artery disease Thoracic aortic aneurysm (mild dilation of aortic arch)   Risk Factors: Known coronary artery disease, hyperlipidemia, male gender, age greater than 85   Cath/PCI: R/LHC (10/17/2022): LMCA normal.  LAD with 20% proximal and 30% mid stenoses.  D1 with 50% stenosis.  Codominant LCx with mild luminal irregularities.  Codominant RCA with 60% proximal stenosis (RFR 0.97).  RA 4, RV 25/4, PA 25/8 (14), PCWP 8.  Ao sat 95%, PA sat 69%.  Fick CO/CI 5.0/2.6; thermodilution CO/CI 4.0/2.1. LHC (08/07/17): LMCA normal. LAD with 50% midvessel stenosis. Codominant LCx with 40% lPDA lesion. 50% proximal and 25% mid RCA lesions. LVEF 55-65%. LVEDP 10 mmHg.   CV Surgery: None   EP Procedures and Devices: None   Non-Invasive Evaluation(s): TTE (09/26/2022): Normal LV size and wall thickness.  LVEF 60-65% with grade 1 diastolic function.  Normal RV size and function.  PA pressure unable to be estimated.  Normal biatrial size.  Trivial aortic regurgitation.  Trivial mitral regurgitation.  Normal CVP.  Mildly dilated ascending aorta, measuring 4.0 cm. TTE (11/19/2021): Normal LV size and wall thickness.  LVEF 60-65% with indeterminate diastolic function.  Normal wall motion.  Normal RV size and function.  Unable to assess PA pressure.  Normal biatrial size.  No significant valvular abnormality. Lower extremity venous duplex (11/18/2021): Findings consistent with age-indeterminate DVT involving the right femoral and peroneal veins. Exercise MPI (06/03/2021): Low risk study without ischemia or scar.  Good exercise capacity, achieving 10.9 METS and 95% MPHR. Coronary calcium score (  06/29/17): Calcium score of 952 with extensive calcifications in the LAD him a as  well as less prominent calcification involving the LMCA, LCx, and RCA. Slight aneurysmal dilation of ascending aortic, measuring 4.0 cm. 18 mm left lower lobe nodule.  Recent CV Pertinent Labs: Lab Results  Component Value Date   CHOL 117 05/27/2021   HDL 45 05/27/2021   LDLCALC 53 05/27/2021   TRIG 102 05/27/2021   CHOLHDL 2.6 05/27/2021   INR 1.06 12/15/2018   BNP 56.9 11/18/2021   K 4.4 10/17/2022   MG 2.1 11/18/2021   BUN 24 (H) 04/01/2022   BUN 18 05/27/2021   CREATININE 1.00 09/16/2022   CREATININE 0.89 04/01/2022    Past medical and surgical history were reviewed and updated in EPIC.  Current Meds  Medication Sig   amoxicillin (AMOXIL) 500 MG capsule Take 2,000 mg by mouth See admin instructions. Take 2,000 mg by mouth one hour prior to dental treatment or as otherwise instructed   apixaban (ELIQUIS) 5 MG TABS tablet Take 1 tablet (5 mg total) by mouth 2 (two) times daily.   bisacodyl (DULCOLAX) 5 MG EC tablet Take 5 mg by mouth daily as needed for mild constipation or moderate constipation.   diphenhydrAMINE (BENADRYL) 25 MG tablet Take 25 mg by mouth at bedtime as needed for sleep.   metFORMIN (GLUCOPHAGE) 500 MG tablet Take 1 tablet (500 mg total) by mouth 2 (two) times daily with a meal.   nitroGLYCERIN (NITROSTAT) 0.4 MG SL tablet Place 1 tablet (0.4 mg total) under the tongue every 5 (five) minutes as needed for chest pain.   REPATHA SURECLICK 528 MG/ML SOAJ INJECT 1 PEN SUBCUTANEOUSLY EVERY 14 DAYS   TRESIBA FLEXTOUCH 100 UNIT/ML FlexTouch Pen Inject 10 Units into the skin daily.   valsartan (DIOVAN) 80 MG tablet Take 1 tablet (80 mg total) by mouth daily.    Allergies: Other, Grass pollen(k-o-r-t-swt vern), and Statins  Social History   Tobacco Use   Smoking status: Former    Packs/day: 1.00    Years: 2.00    Total pack years: 2.00    Types: Cigarettes    Quit date: 08/03/1969    Years since quitting: 53.3   Smokeless tobacco: Never  Vaping Use   Vaping  Use: Never used  Substance Use Topics   Alcohol use: Yes    Alcohol/week: 4.0 standard drinks of alcohol    Types: 4 Standard drinks or equivalent per week    Comment: 1 drink every other day   Drug use: No    Family History  Problem Relation Age of Onset   Multiple myeloma Mother    Cancer - Other Mother 71       BREAST   Other Mother        sepsis   Dementia Father    Cancer Father        neck    Cancer - Other Maternal Grandmother    Diabetes Maternal Grandmother    Heart attack Maternal Grandfather 47   Dementia Paternal Grandfather    Healthy Son    Healthy Son    Healthy Daughter    Heart attack Cousin    Cancer Paternal Grandmother     Review of Systems: A 12-system review of systems was performed and was negative except as noted in the HPI.  --------------------------------------------------------------------------------------------------  Physical Exam: BP 110/70 (BP Location: Left Arm, Patient Position: Sitting, Cuff Size: Normal)   Pulse 69   Ht 5\' 10"  (1.778  m)   Wt 165 lb (74.8 kg)   SpO2 98%   BMI 23.68 kg/m   General:  NAD. Neck: No JVD or HJR. Lungs: Clear to auscultation bilaterally without wheezes or crackles. Heart: Regular rate and rhythm without murmurs, rubs, or gallops. Abdomen: Soft, nontender, nondistended. Extremities: No lower extremity edema.  Right radial arteriotomy site is well-healed with 2+ pulse.  EKG: Normal sinus rhythm without abnormality.  Lab Results  Component Value Date   WBC 5.4 10/05/2022   HGB 12.9 (L) 10/17/2022   HCT 38.0 (L) 10/17/2022   MCV 91.6 10/05/2022   PLT 184 10/05/2022    Lab Results  Component Value Date   NA 141 10/17/2022   K 4.4 10/17/2022   CL 110 04/01/2022   CO2 24 04/01/2022   BUN 24 (H) 04/01/2022   CREATININE 1.00 09/16/2022   GLUCOSE 105 (H) 04/01/2022   ALT 21 04/01/2022    Lab Results  Component Value Date   CHOL 117 05/27/2021   HDL 45 05/27/2021   LDLCALC 53 05/27/2021    TRIG 102 05/27/2021   CHOLHDL 2.6 05/27/2021    --------------------------------------------------------------------------------------------------  ASSESSMENT AND PLAN: Coronary artery disease: Ms. Mcmahon has not had any further angina or significant dyspnea.  He went of never adding back isosorbide mononitrate because he was concerned about headaches.  Given that he has not had any significant angina, I think it is reasonable to defer adding this given reassuring catheterization last month without obstructive CAD.  We will plan to continue apixaban and lieu of aspirin given history of submassive PE last year and Repatha to prevent progression of CAD.  Hypertension: Blood pressure well-controlled today.  I am not exactly sure what to make of episode of feeling "strange" when crossing the street.  Is possible he may have had some transient hypotension or hypoglycemia though he notes that his blood pressure typically is not low.  He is using a continuous glucose monitor and reports that glucose readings indicate low levels when he first places the monitor though typically his blood sugar is well-controlled.  I wonder if addition of multiple medications over the last few months may be causing some mild side effects.  We will defer changing things today, continuing current dose of valsartan and deferring initiation of isosorbide mononitrate.  Hyperlipidemia associated with type 2 diabetes mellitus: Continue Repatha and metformin.  Ongoing management of diabetes mellitus per Dr. Brigitte Pulse.  Lipid panel through Dr. Raul Del office last month showed excellent lipid control with LDL 51 and triglycerides 78.  History of pulmonary embolism: No symptoms to suggest recurrence.  Recent right heart catheterization without pulmonary hypertension to suggest CTEPH.  Recommend continuation of long-term anticoagulation in the setting of unprovoked submassive PE.  Follow-up: Return to clinic in 6 months.  Nelva Bush, MD 11/18/2022 9:00 AM

## 2022-11-21 ENCOUNTER — Encounter: Payer: Self-pay | Admitting: Internal Medicine

## 2022-11-21 ENCOUNTER — Other Ambulatory Visit (HOSPITAL_COMMUNITY): Payer: Self-pay

## 2022-11-22 ENCOUNTER — Telehealth: Payer: Self-pay | Admitting: Internal Medicine

## 2022-11-22 NOTE — Telephone Encounter (Signed)
Pt c/o medication issue:  1. Name of Medication:   REPATHA SURECLICK 536 MG/ML SOAJ    2. How are you currently taking this medication (dosage and times per day)? NJECT 1 PEN SUBCUTANEOUSLY EVERY 14 DAYS   3. Are you having a reaction (difficulty breathing--STAT)? No  4. What is your medication issue? Pt states that he received a denial letter from  [employer] regarding Prior Auth for medication. He would like a callback regarding this matter. Please advise

## 2022-11-22 NOTE — Telephone Encounter (Signed)
Looks like patient has received a denial letter for [place].

## 2022-11-23 ENCOUNTER — Other Ambulatory Visit (HOSPITAL_COMMUNITY): Payer: Self-pay

## 2022-11-23 NOTE — Telephone Encounter (Signed)
Hi! It was denied on CMM but shouldn't have been. I submitted an appeal and am waiting determination. I will call and check for an update now. I will add chart notes with outcome.

## 2022-11-23 NOTE — Telephone Encounter (Signed)
I am not surprised because I had to submit an appeal which is still currently pending. I will add chart notes when determination comes back.

## 2022-11-29 ENCOUNTER — Telehealth: Payer: Self-pay | Admitting: Internal Medicine

## 2022-11-29 ENCOUNTER — Other Ambulatory Visit (HOSPITAL_COMMUNITY): Payer: Self-pay

## 2022-11-29 ENCOUNTER — Telehealth: Payer: Self-pay

## 2022-11-29 NOTE — Telephone Encounter (Signed)
Pharmacy Patient Advocate Encounter  Prior Authorization for REPATHA 140 MG/ML INJ has been approved.    Effective dates: 11/25/22 through 05/26/23  Spoke with Pharmacy to process.   Received notification from [facility] that prior authorization for REPATHA 140 MG/ML INJ is needed.    PA submitted on 11/18/22 Key [id number] Status is pending  Karie Soda, [place] Patient Advocate Specialist Direct Number: [phone] Fax: [phone]

## 2022-11-29 NOTE — Telephone Encounter (Signed)
Called and spoke with the patient to inform him that the prior authorization for Repatha was approved and the pharmacy tech here at [facility] spoke to the pharmacist at [facility] to process the prescription order. Patient verbalized understanding.

## 2022-11-29 NOTE — Telephone Encounter (Signed)
*  STAT* If patient is at the pharmacy, call can be transferred to refill team.   1. Which medications need to be refilled? (please list name of each medication and dose if known) REPATHA SURECLICK 607 MG/ML SOAJ   2. Which pharmacy/location (including street and city if local pharmacy) is medication to be sent to? [place], [place]   3. Do they need a 30 day or 90 day supply? 30 day

## 2022-12-01 ENCOUNTER — Other Ambulatory Visit: Payer: Self-pay | Admitting: Hematology

## 2022-12-01 ENCOUNTER — Other Ambulatory Visit: Payer: Self-pay | Admitting: Internal Medicine

## 2022-12-14 ENCOUNTER — Other Ambulatory Visit: Payer: Self-pay | Admitting: Internal Medicine

## 2022-12-14 DIAGNOSIS — I712 Thoracic aortic aneurysm, without rupture, unspecified: Secondary | ICD-10-CM

## 2023-01-13 ENCOUNTER — Ambulatory Visit
Admission: RE | Admit: 2023-01-13 | Discharge: 2023-01-13 | Disposition: A | Discharge status: Home / Self Care | Payer: Medicare Other | Source: Ambulatory Visit | Attending: Internal Medicine | Admitting: Internal Medicine

## 2023-01-13 DIAGNOSIS — I712 Thoracic aortic aneurysm, without rupture, unspecified: Secondary | ICD-10-CM

## 2023-01-13 MED ORDER — IOPAMIDOL (ISOVUE-370) INJECTION 76%
75.0000 mL | Freq: Once | INTRAVENOUS | Status: AC | PRN
  Administered 2023-01-13: 75 mL via INTRAVENOUS

## 2023-05-23 ENCOUNTER — Encounter: Payer: Self-pay | Admitting: Internal Medicine

## 2023-05-26 IMAGING — DX DG CHEST 1V PORT
2 series · 2 of 2 positions shown · non-contrast
Comparison: CT a chest dated January 24, 2020.

CLINICAL DATA: Arm tingling.

EXAM:
PORTABLE CHEST 1 VIEW

[chest ap (1 of 2)]
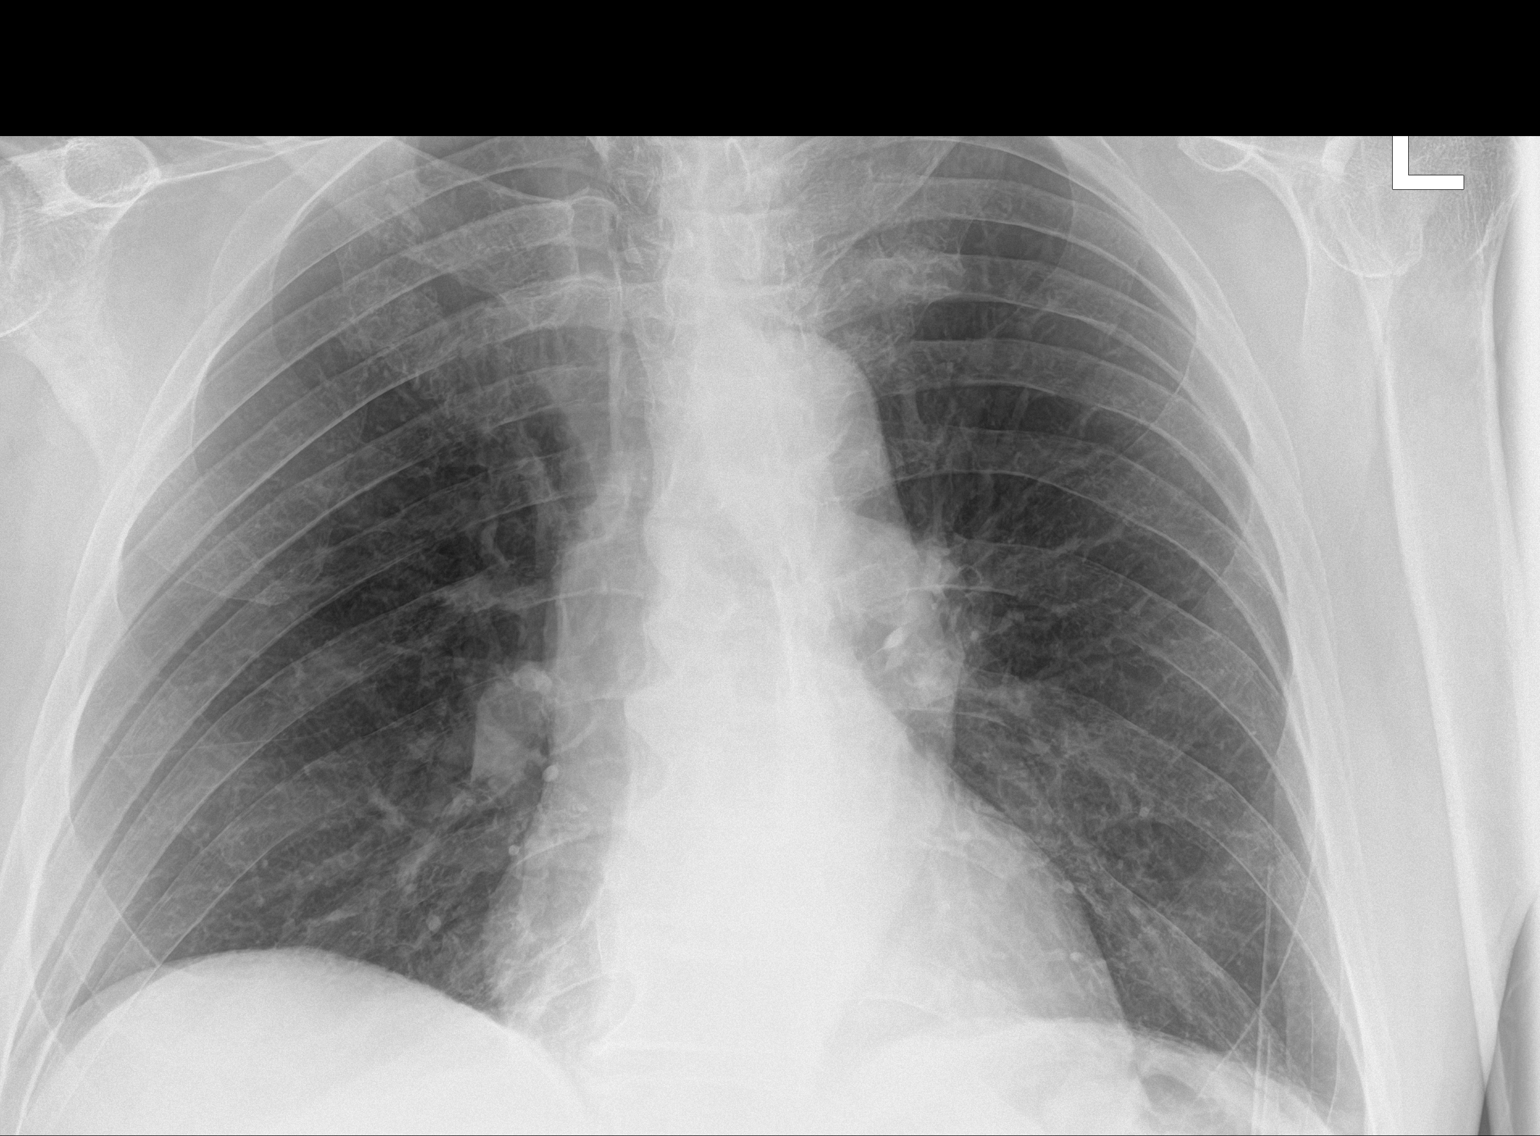

[chest ap (2 of 2)]
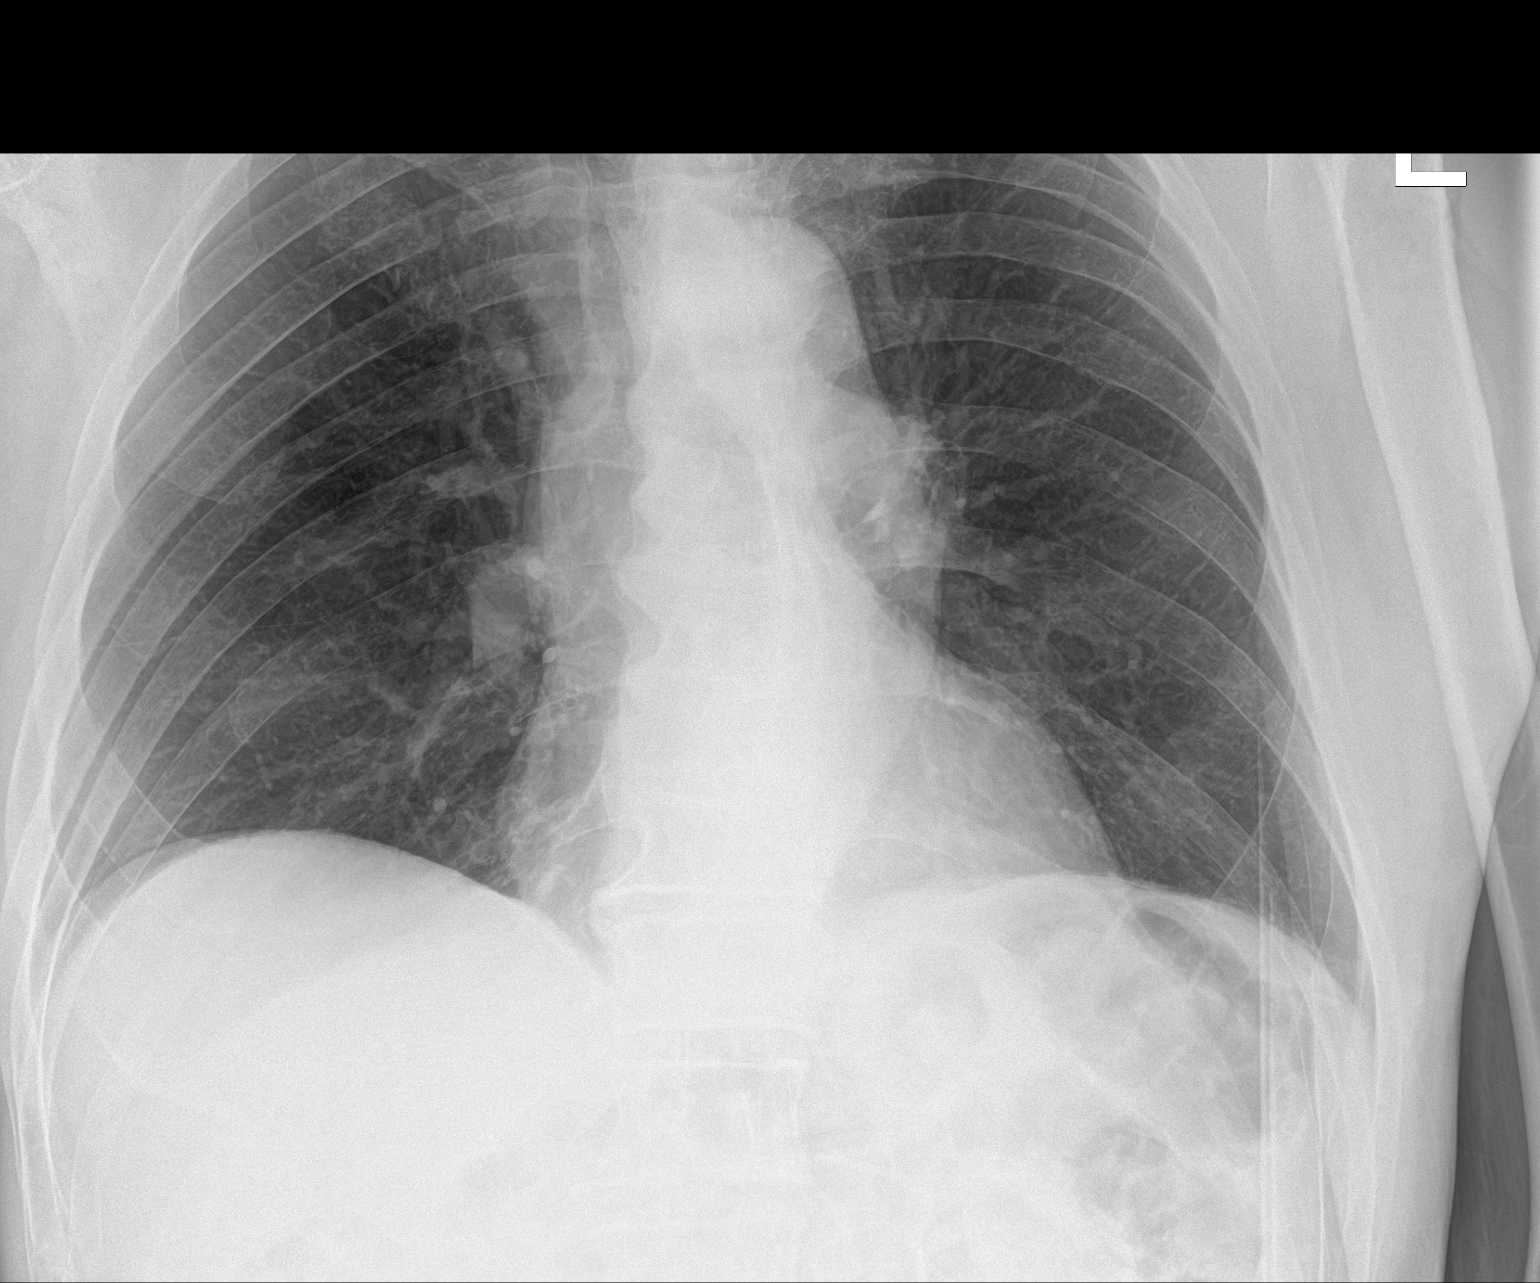

[2 of 2 positions shown; findings below may reference images not displayed]

FINDINGS: The heart size and mediastinal contours are within normal limits.
Both lungs are clear. The visualized skeletal structures are
unremarkable.
IMPRESSION: No active disease.

## 2023-07-12 ENCOUNTER — Other Ambulatory Visit: Payer: Self-pay | Admitting: Internal Medicine

## 2023-07-12 DIAGNOSIS — I25119 Atherosclerotic heart disease of native coronary artery with unspecified angina pectoris: Secondary | ICD-10-CM

## 2023-07-12 NOTE — Telephone Encounter (Signed)
Please contact pt for future appointment. Pt due for 6 month f/u. 

## 2023-07-13 NOTE — Telephone Encounter (Signed)
Left voicemail to schedule follow up appt

## 2023-08-11 ENCOUNTER — Other Ambulatory Visit: Payer: Self-pay | Admitting: Internal Medicine

## 2023-08-16 ENCOUNTER — Telehealth: Payer: Self-pay | Admitting: Pharmacy Technician

## 2023-08-16 ENCOUNTER — Other Ambulatory Visit (HOSPITAL_COMMUNITY): Payer: Self-pay

## 2023-08-16 NOTE — Telephone Encounter (Signed)
Pharmacy Patient Advocate Encounter  Received notification from [facility] that Prior Authorization for repatha has been APPROVED from 08/16/23 to 10/30/24   PA #/Case ID/Reference #: [id number]

## 2023-08-16 NOTE — Telephone Encounter (Signed)
Pharmacy Patient Advocate Encounter   Received notification from [employer] that prior authorization for repatha is required/requested.   Insurance verification completed.   The patient is insured through [facility] .   Per test claim: PA required; PA submitted to [facility] via CoverMyMeds Key/confirmation #/EOC [id number] Status is pending

## 2023-09-06 ENCOUNTER — Ambulatory Visit: Payer: Medicare Other | Attending: Internal Medicine | Admitting: Internal Medicine

## 2023-09-06 ENCOUNTER — Encounter: Payer: Self-pay | Admitting: Internal Medicine

## 2023-09-06 VITALS — BP 110/62 | HR 71 | Ht 70.0 in | Wt 163.1 lb

## 2023-09-06 DIAGNOSIS — I10 Essential (primary) hypertension: Secondary | ICD-10-CM

## 2023-09-06 DIAGNOSIS — E1169 Type 2 diabetes mellitus with other specified complication: Secondary | ICD-10-CM | POA: Diagnosis not present

## 2023-09-06 DIAGNOSIS — E785 Hyperlipidemia, unspecified: Secondary | ICD-10-CM

## 2023-09-06 DIAGNOSIS — I2510 Atherosclerotic heart disease of native coronary artery without angina pectoris: Secondary | ICD-10-CM

## 2023-09-06 DIAGNOSIS — Z86711 Personal history of pulmonary embolism: Secondary | ICD-10-CM | POA: Diagnosis not present

## 2023-09-06 DIAGNOSIS — R0609 Other forms of dyspnea: Secondary | ICD-10-CM

## 2023-09-06 DIAGNOSIS — I25119 Atherosclerotic heart disease of native coronary artery with unspecified angina pectoris: Secondary | ICD-10-CM

## 2023-09-06 NOTE — Patient Instructions (Signed)
Medication Instructions:  Your physician recommends that you continue on your current medications as directed. Please refer to the Current Medication list given to you today.   *If you need a refill on your cardiac medications before your next appointment, please call your pharmacy*   Lab Work: No labs ordered today    Testing/Procedures: No test ordered today    Follow-Up: At [facility], you and your health needs are our priority.  As part of our continuing mission to provide you with exceptional heart care, we have created designated Provider Care Teams.  These Care Teams include your primary Cardiologist (physician) and Advanced Practice Providers (APPs -  Physician Assistants and Nurse Practitioners) who all work together to provide you with the care you need, when you need it.  We recommend signing up for the patient portal called "MyChart".  Sign up information is provided on this After Visit Summary.  MyChart is used to connect with patients for Virtual Visits (Telemedicine).  Patients are able to view lab/test results, encounter notes, upcoming appointments, etc.  Non-urgent messages can be sent to your provider as well.   To learn more about what you can do with MyChart, go to [email or web address].    Your next appointment:   1 year(s)  Provider:   You may see Yvonne Kendall, MD or one of the following Advanced Practice Providers on your designated Care Team:   Nicolasa Ducking, NP Eula Listen, PA-C Cadence Fransico Michael, PA-C Charlsie Quest, NP Carlos Levering, NP

## 2023-09-06 NOTE — Progress Notes (Signed)
Cardiology Office Note:  .   Date:  09/07/2023  ID:  John Mcmahon, DOB Jan 18, 1946, [name] [id number] PCP: Cleatis Polka., MD  [facility] Providers Cardiologist:  Yvonne Kendall, MD     History of Present Illness: .   John Mcmahon is a 77 y.o. male with history of coronary artery disease with moderate multivessel CAD, unprovoked submassive pulmonary embolism (10/2021), thoracic aortic aneurysm with mildly dilated aortic arch (followed by Dr. Clelia Croft) hyperlipidemia, and type 2 diabetes mellitus, who presents for follow-up of CAD and pulmonary embolism.  I last saw him in January, which time he was feeling fairly well.  We had previously discussed adding isosorbide mononitrate after repeat cath in 09/2022 showed mild to moderate nonobstructive CAD with a 60% proximal RCA stenosis.  He wound up deferring the isosorbide mononitrate out of concerns that it could precipitate headaches.  He was walking 2-2.5 miles a day without any difficulty.  He noted a single brief episode of feeling "strange" but did not have any frank angina or significant dyspnea.  Today, [name] reports that he has been feeling well other than some labile blood sugars.  He has not had any significant hypoglycemic events.  He denies chest pain, shortness of breath, palpitations, lightheadedness, or edema.  He and his wife notes that [name] his stamina has continued to improve since our last visit in January.  He remains on indefinite anticoagulation with apixaban and has not had any bleeding.  ROS: See HPI  Studies Reviewed: Marland Kitchen   EKG Interpretation Date/Time:  Wednesday September 06 2023 11:13:23 EST Ventricular Rate:  71 PR Interval:  164 QRS Duration:  74 QT Interval:  376 QTC Calculation: 408 R Axis:   9  Text Interpretation: Normal sinus rhythm Normal ECG When compared with ECG of 18-Nov-2022 No significant change was found Confirmed by Avel Ogawa, Cristal Deer [phone]) on 09/07/2023 7:37:36 AM     Cath/PCI: R/LHC (10/17/2022): LMCA normal.  LAD with 20% proximal and 30% mid stenoses.  D1 with 50% stenosis.  Codominant LCx with mild luminal irregularities.  Codominant RCA with 60% proximal stenosis (RFR 0.97).  RA 4, RV 25/4, PA 25/8 (14), PCWP 8.  Ao sat 95%, PA sat 69%.  Fick CO/CI 5.0/2.6; thermodilution CO/CI 4.0/2.1. LHC (08/07/17): LMCA normal. LAD with 50% midvessel stenosis. Codominant LCx with 40% lPDA lesion. 50% proximal and 25% mid RCA lesions. LVEF 55-65%. LVEDP 10 mmHg.   Non-Invasive Evaluation(s): TTE (09/26/2022): Normal LV size and wall thickness.  LVEF 60-65% with grade 1 diastolic function.  Normal RV size and function.  PA pressure unable to be estimated.  Normal biatrial size.  Trivial aortic regurgitation.  Trivial mitral regurgitation.  Normal CVP.  Mildly dilated ascending aorta, measuring 4.0 cm. TTE (11/19/2021): Normal LV size and wall thickness.  LVEF 60-65% with indeterminate diastolic function.  Normal wall motion.  Normal RV size and function.  Unable to assess PA pressure.  Normal biatrial size.  No significant valvular abnormality. Lower extremity venous duplex (11/18/2021): Findings consistent with age-indeterminate DVT involving the right femoral and peroneal veins. Exercise MPI (06/03/2021): Low risk study without ischemia or scar.  Good exercise capacity, achieving 10.9 METS and 95% MPHR. Coronary calcium score (06/29/17): Calcium score of 952 with extensive calcifications in the LAD him a as well as less prominent calcification involving the LMCA, LCx, and RCA. Slight aneurysmal dilation of ascending aortic, measuring 4.0 cm. 18 mm left lower lobe nodule.  Risk Assessment/Calculations:  STOP-Bang Score:         Physical Exam:   VS:  BP 110/62 (BP Location: Left Arm, Patient Position: Sitting, Cuff Size: Normal)   Pulse 71   Ht 5\' 10"  (1.778 m)   Wt 163 lb 2 oz (74 kg)   SpO2 97%   BMI 23.41 kg/m    Wt Readings from Last 3 Encounters:  09/06/23  163 lb 2 oz (74 kg)  11/18/22 165 lb (74.8 kg)  10/17/22 165 lb (74.8 kg)    General:  NAD. Neck: No JVD or HJR. Lungs: Clear to auscultation bilaterally without wheezes or crackles. Heart: Regular rate and rhythm without murmurs, rubs, or gallops. Abdomen: Soft, nontender, nondistended. Extremities: No lower extremity edema.  ASSESSMENT AND PLAN: .    Coronary artery disease: [name] continues to do well without chest pain.  His exertional dyspnea is also improving.  Continue Repatha for secondary prevention of CAD as well as apixaban and lieu of aspirin given history of unprovoked PE.  History of pulmonary embolism: Continue indefinite apixaban 5 mg twice daily, as recommended by hematology.  Hyperlipidemia associated with type 2 diabetes mellitus: Continue Repatha with upcoming labs at visit with Dr. Clelia Croft later this month to ensure LDL remains at goal (less than 70).  Continue management of DM per Dr. Clelia Croft.  Hypertension: Blood pressure well-controlled today.  Continue valsartan 80 mg daily.    Dispo: Return to clinic in 1 year.  Signed, Yvonne Kendall, MD

## 2023-09-07 ENCOUNTER — Encounter: Payer: Self-pay | Admitting: Internal Medicine

## 2023-11-07 ENCOUNTER — Other Ambulatory Visit: Payer: Self-pay | Admitting: Internal Medicine

## 2024-10-17 ENCOUNTER — Telehealth: Payer: Self-pay | Admitting: Internal Medicine

## 2024-10-17 NOTE — Telephone Encounter (Signed)
°*  STAT* If patient is at the pharmacy, call can be transferred to refill team.   1. Which medications need to be refilled? (please list name of each medication and dose if known)   Evolocumab  (REPATHA  SURECLICK) 140 MG/ML SOAJ   NEW PHARMACY - NO LONGER USING MAIL ORDER   4. Which pharmacy/location (including street and city if local pharmacy) is medication to be sent to?  [facility] [place], [place] - [address] [facility] [address] Phone: [phone]  Fax: [phone]       5. Do they need a 30 day or 90 day supply? 90

## 2024-10-18 MED ORDER — REPATHA SURECLICK 140 MG/ML ~~LOC~~ SOAJ: 140.0000 mg | SUBCUTANEOUS | 0 refills | Status: DC

## 2024-11-13 ENCOUNTER — Encounter: Payer: Self-pay | Admitting: Internal Medicine

## 2024-11-13 ENCOUNTER — Ambulatory Visit: Attending: Internal Medicine | Admitting: Internal Medicine

## 2024-11-13 VITALS — BP 120/68 | HR 66 | Ht 70.0 in | Wt 165.8 lb

## 2024-11-13 DIAGNOSIS — E1169 Type 2 diabetes mellitus with other specified complication: Secondary | ICD-10-CM

## 2024-11-13 DIAGNOSIS — E785 Hyperlipidemia, unspecified: Secondary | ICD-10-CM

## 2024-11-13 DIAGNOSIS — Z86711 Personal history of pulmonary embolism: Secondary | ICD-10-CM | POA: Diagnosis not present

## 2024-11-13 DIAGNOSIS — I2510 Atherosclerotic heart disease of native coronary artery without angina pectoris: Secondary | ICD-10-CM

## 2024-11-13 MED ORDER — REPATHA SURECLICK 140 MG/ML ~~LOC~~ SOAJ: 140.0000 mg | SUBCUTANEOUS | 3 refills | Status: AC

## 2024-11-13 NOTE — Progress Notes (Unsigned)
 " Cardiology Office Note:  .   Date:  11/15/2024  ID:  John Mcmahon, DOB 1946/03/15, [name] [id number] PCP: Loreli Elsie JONETTA Mickey., MD  [facility] Providers Cardiologist:  Lonni Hanson, MD     History of Present Illness: .   John Mcmahon is a 79 y.o. male with history of coronary artery disease with moderate multivessel CAD, unprovoked submassive pulmonary embolism (10/2021), thoracic aortic aneurysm with mildly dilated aortic arch (followed by Dr. Loreli) hyperlipidemia, and type 2 diabetes mellitus, who presents for follow-up of CAD and pulmonary embolism.  I last saw him in 09/2023, at which time he was feeling well though he noted somewhat labile blood sugars.  We did not make any medication changes or pursue additional testing.  Today, [name] reports that he has been feeling well from a heart standpoint without chest pain, palpitations, or edema.  He notes that he has not been exercising as much due to a resolving leg issue.  He has some mild dyspnea with strenuous activities on account of this.  He also notes occasional static lightheadedness when he stands up after having bent over.  He has not passed out or fallen.  He is tolerating evolocumab  well and notes that his labs looked good on the last check through Dr. Orlando office last month.  ROS: See HPI  Studies Reviewed: SABRA   EKG Interpretation Date/Time:  Wednesday November 13 2024 16:07:23 EST Ventricular Rate:  66 PR Interval:  158 QRS Duration:  76 QT Interval:  392 QTC Calculation: 410 R Axis:   7  Text Interpretation: Normal sinus rhythm Septal infarct versus lead placement Borderline ECG When compared with ECG of 06-Sep-2023 11:13, Possible Septal infarct is now Present ; tracing similar to 10/17/2022 Confirmed by Keiri Solano, Lonni [phone]) on 11/15/2024 7:55:33 AM    Cath/PCI: R/LHC (10/17/2022): LMCA normal.  LAD with 20% proximal and 30% mid stenoses.  D1 with 50% stenosis.  Codominant LCx with mild luminal  irregularities.  Codominant RCA with 60% proximal stenosis (RFR 0.97).  RA 4, RV 25/4, PA 25/8 (14), PCWP 8.  Ao sat 95%, PA sat 69%.  Fick CO/CI 5.0/2.6; thermodilution CO/CI 4.0/2.1. LHC (08/07/17): LMCA normal. LAD with 50% midvessel stenosis. Codominant LCx with 40% lPDA lesion. 50% proximal and 25% mid RCA lesions. LVEF 55-65%. LVEDP 10 mmHg.   Non-Invasive Evaluation(s): TTE (09/26/2022): Normal LV size and wall thickness.  LVEF 60-65% with grade 1 diastolic function.  Normal RV size and function.  PA pressure unable to be estimated.  Normal biatrial size.  Trivial aortic regurgitation.  Trivial mitral regurgitation.  Normal CVP.  Mildly dilated ascending aorta, measuring 4.0 cm. TTE (11/19/2021): Normal LV size and wall thickness.  LVEF 60-65% with indeterminate diastolic function.  Normal wall motion.  Normal RV size and function.  Unable to assess PA pressure.  Normal biatrial size.  No significant valvular abnormality. Lower extremity venous duplex (11/18/2021): Findings consistent with age-indeterminate DVT involving the right femoral and peroneal veins. Exercise MPI (06/03/2021): Low risk study without ischemia or scar.  Good exercise capacity, achieving 10.9 METS and 95% MPHR. Coronary calcium score (06/29/17): Calcium score of 952 with extensive calcifications in the LAD him a as well as less prominent calcification involving the LMCA, LCx, and RCA. Slight aneurysmal dilation of ascending aortic, measuring 4.0 cm. 18 mm left lower lobe nodule.  Risk Assessment/Calculations:       Physical Exam:   VS:  BP 120/68 (BP Location: Left Arm, Patient  Position: Sitting, Cuff Size: Normal)   Pulse 66 Comment: 80 oximeter  Ht 5' 10 (1.778 m)   Wt 165 lb 12.8 oz (75.2 kg)   SpO2 96%   BMI 23.79 kg/m    Wt Readings from Last 3 Encounters:  11/13/24 165 lb 12.8 oz (75.2 kg)  09/06/23 163 lb 2 oz (74 kg)  11/18/22 165 lb (74.8 kg)    General:  NAD. Neck: No JVD or HJR. Lungs: Clear to  auscultation bilaterally without wheezes or crackles. Heart: Regular rate and rhythm without murmurs, rubs, or gallops. Abdomen: Soft, nontender, nondistended. Extremities: No lower extremity edema.  ASSESSMENT AND PLAN: .    Coronary artery disease: No angina reported.  Mild exertional dyspnea in the setting of being less active recently is likely due to deconditioning.  Mr. Brod should alert us  if his symptoms worsen.  Continue apixaban  and lieu of aspirin  as well as evolocumab  for secondary prevention.  History of pulmonary embolism: No evidence of recurrence.  Continue indefinite anticoagulation with apixaban .  Hyperlipidemia associated with type 2 diabetes mellitus: Mr. Bullinger is tolerating evolocumab  well.  He reports good control of his lipids based on the labs drawn by Dr. Loreli last month.  Will plan to continue with evolocumab .  Will reach out to Dr. Orlando office to have a copy of Mr. Spikes most recent labs sent to us  for our records.  History of thoracic aortic aneurysm: Mildly dilated thoracic aorta previously noted, followed by Dr. Loreli.  No thoracic aneurysm was identified on the last CTA in 12/2022.  No further follow-up was recommended.    Dispo: Return to clinic in 1 year.  Signed, Lonni Hanson, MD  "

## 2024-11-13 NOTE — Patient Instructions (Signed)
 Medication Instructions:  Continue your current medications.  A refill for Repatha  has been sent to [facility] pharmacy.  *If you need a refill on your cardiac medications before your next appointment, please call your pharmacy*  Lab Work: None  We will reach out to Dr. Orlando office for a copy of your most recent lab.  Testing/Procedures: None  Follow-Up: At [facility], you and your health needs are our priority.  As part of our continuing mission to provide you with exceptional heart care, our providers are all part of one team.  This team includes your primary Cardiologist (physician) and Advanced Practice Providers or APPs (Physician Assistants and Nurse Practitioners) who all work together to provide you with the care you need, when you need it.  Your next appointment:   1 year(s)  Provider:   You may see Lonni Hanson, MD or one of the following Advanced Practice Providers on your designated Care Team:   Lonni Meager, NP Lesley Maffucci, PA-C Bernardino Bring, PA-C Cadence Fairfax, PA-C Tylene Lunch, NP Barnie Hila, NP    We recommend signing up for the patient portal called MyChart.  Sign up information is provided on this After Visit Summary.  MyChart is used to connect with patients for Virtual Visits (Telemedicine).  Patients are able to view lab/test results, encounter notes, upcoming appointments, etc.  Non-urgent messages can be sent to your provider as well.   To learn more about what you can do with MyChart, go to [email or web address].   Other Instructions

## 2024-11-14 ENCOUNTER — Telehealth: Payer: Self-pay

## 2024-11-14 NOTE — Telephone Encounter (Signed)
 Most recent labs were requested via fax from Dr. Rolly office for Dr. Mady to review.

## 2024-11-15 ENCOUNTER — Encounter: Payer: Self-pay | Admitting: Internal Medicine

## 2024-11-27 ENCOUNTER — Ambulatory Visit: Admitting: Internal Medicine

## 2024-12-04 ENCOUNTER — Telehealth: Payer: Self-pay | Admitting: Internal Medicine

## 2024-12-04 MED ORDER — REPATHA SURECLICK 140 MG/ML ~~LOC~~ SOAJ: 140.0000 mg | SUBCUTANEOUS | 0 refills | Status: AC

## 2024-12-04 NOTE — Telephone Encounter (Signed)
 Called patient to review dosing lag - pt changed insurance and the initial shipment was delayed - checked and we do not have samples to give him  Per Dr. Mady - an additioanl RX for 2 pens was sent to [facility] to get patient caught up  Patient notified

## 2024-12-04 NOTE — Telephone Encounter (Signed)
 PT states he just received a letter in mail from [facility] stating he will need a prior authorization for the Repatha . Please advise.

## 2024-12-04 NOTE — Telephone Encounter (Signed)
 Pt c/o medication issue:  1. Name of Medication: Evolocumab  (REPATHA  SURECLICK) 140 MG/ML SOAJ  2. How are you currently taking this medication (dosage and times per day)? As written   3. Are you having a reaction (difficulty breathing--STAT)? Pt   4. What is your medication issue? Pt called in he didn't get shipment in time so he used one of his wife's since they are both on it. He is stating [facility] told him they will send a replacement but it will more than likely be delivered later than when he needs it by and either way he will be short one dose.. please advise if there is anything that can be done about this.

## 2024-12-05 ENCOUNTER — Other Ambulatory Visit (HOSPITAL_COMMUNITY): Payer: Self-pay

## 2024-12-05 ENCOUNTER — Telehealth: Payer: Self-pay | Admitting: Pharmacy Technician

## 2024-12-05 NOTE — Telephone Encounter (Signed)
 Pharmacy Patient Advocate Encounter  Received notification from [employer] that Prior Authorization for Repatha  has been APPROVED from 10/31/24 to 10/30/25. Unable to obtain price due to refill too soon rejection, last fill date 11/25/24 next available fill date02/19/26   PA #/Case ID/Reference #: [id number]

## 2024-12-05 NOTE — Telephone Encounter (Signed)
 Pharmacy Patient Advocate Encounter   Received notification from Pt Calls Messages that prior authorization for Repatha  is required/requested.   Insurance verification completed.   The patient is insured through [employer].   Per test claim: PA required; PA submitted to above mentioned insurance via Latent Key/confirmation #/EOC [id number] Status is pending
